# Patient Record
Sex: Female | Born: 1955 | Race: White | Hispanic: No | Marital: Married | Smoking: Former smoker
Health system: Southern US, Community
[De-identification: ages and names within clinical notes are randomized; demographics above are authoritative.]

## PROBLEM LIST (undated history)

## (undated) DIAGNOSIS — S139XXA Sprain of joints and ligaments of unspecified parts of neck, initial encounter: Secondary | ICD-10-CM

## (undated) DIAGNOSIS — IMO0001 Reserved for inherently not codable concepts without codable children: Secondary | ICD-10-CM

## (undated) DIAGNOSIS — K219 Gastro-esophageal reflux disease without esophagitis: Secondary | ICD-10-CM

## (undated) HISTORY — PX: CERVICAL FUSION: SHX112

## (undated) HISTORY — DX: Sprain of joints and ligaments of unspecified parts of neck, initial encounter: S13.9XXA

## (undated) HISTORY — PX: ESOPHAGEAL DILATION: SHX303

## (undated) HISTORY — PX: ABDOMINAL HYSTERECTOMY: SHX81

---

## 2004-05-01 ENCOUNTER — Other Ambulatory Visit: Payer: Self-pay

## 2004-10-30 HISTORY — PX: BREAST CYST ASPIRATION: SHX578

## 2004-10-30 HISTORY — PX: BREAST BIOPSY: SHX20

## 2005-01-24 ENCOUNTER — Ambulatory Visit: Payer: Self-pay

## 2005-02-13 ENCOUNTER — Ambulatory Visit: Payer: Self-pay

## 2005-06-20 ENCOUNTER — Emergency Department: Payer: Self-pay | Admitting: Emergency Medicine

## 2005-06-20 ENCOUNTER — Other Ambulatory Visit: Payer: Self-pay

## 2005-08-30 ENCOUNTER — Ambulatory Visit: Payer: Self-pay

## 2006-02-28 ENCOUNTER — Ambulatory Visit: Payer: Self-pay

## 2006-04-06 ENCOUNTER — Emergency Department: Payer: Self-pay | Admitting: General Practice

## 2007-03-20 ENCOUNTER — Ambulatory Visit: Payer: Self-pay

## 2007-05-09 ENCOUNTER — Ambulatory Visit: Payer: Self-pay

## 2007-07-24 ENCOUNTER — Ambulatory Visit: Payer: Self-pay

## 2008-05-26 ENCOUNTER — Emergency Department: Payer: Self-pay | Admitting: Internal Medicine

## 2008-06-16 ENCOUNTER — Ambulatory Visit: Payer: Self-pay

## 2008-09-12 ENCOUNTER — Emergency Department: Payer: Self-pay | Admitting: Unknown Physician Specialty

## 2008-10-30 ENCOUNTER — Emergency Department: Payer: Self-pay | Admitting: Emergency Medicine

## 2009-03-05 ENCOUNTER — Ambulatory Visit: Payer: Self-pay | Admitting: Internal Medicine

## 2009-03-08 ENCOUNTER — Ambulatory Visit: Payer: Self-pay | Admitting: Internal Medicine

## 2009-03-12 ENCOUNTER — Ambulatory Visit: Payer: Self-pay | Admitting: Family Medicine

## 2009-05-27 ENCOUNTER — Ambulatory Visit: Payer: Self-pay | Admitting: Pain Medicine

## 2009-06-08 ENCOUNTER — Emergency Department: Payer: Self-pay | Admitting: Emergency Medicine

## 2009-06-14 ENCOUNTER — Ambulatory Visit: Payer: Self-pay | Admitting: Pain Medicine

## 2009-06-16 ENCOUNTER — Ambulatory Visit: Payer: Self-pay

## 2009-06-23 ENCOUNTER — Ambulatory Visit: Payer: Self-pay | Admitting: Pain Medicine

## 2009-06-30 ENCOUNTER — Ambulatory Visit: Payer: Self-pay | Admitting: Surgery

## 2009-07-14 HISTORY — PX: BREAST BIOPSY: SHX20

## 2009-07-29 ENCOUNTER — Emergency Department (HOSPITAL_COMMUNITY): Admission: EM | Admit: 2009-07-29 | Discharge: 2009-07-29 | Payer: Self-pay | Admitting: Emergency Medicine

## 2010-04-21 ENCOUNTER — Emergency Department: Payer: Self-pay | Admitting: Emergency Medicine

## 2010-06-21 ENCOUNTER — Ambulatory Visit: Payer: Self-pay

## 2011-02-02 ENCOUNTER — Emergency Department: Payer: Self-pay | Admitting: Emergency Medicine

## 2011-02-27 ENCOUNTER — Emergency Department: Payer: Self-pay | Admitting: Emergency Medicine

## 2011-03-28 ENCOUNTER — Inpatient Hospital Stay: Payer: Self-pay | Admitting: Surgery

## 2011-08-15 ENCOUNTER — Ambulatory Visit: Payer: Self-pay

## 2012-06-04 ENCOUNTER — Ambulatory Visit: Payer: Self-pay

## 2013-01-01 ENCOUNTER — Ambulatory Visit: Payer: Self-pay

## 2013-01-15 ENCOUNTER — Ambulatory Visit: Payer: Self-pay

## 2013-02-03 ENCOUNTER — Ambulatory Visit: Payer: Self-pay | Admitting: Surgery

## 2013-02-03 HISTORY — PX: BREAST BIOPSY: SHX20

## 2013-02-04 LAB — PATHOLOGY REPORT

## 2013-09-19 ENCOUNTER — Emergency Department: Payer: Self-pay | Admitting: Emergency Medicine

## 2013-09-19 LAB — CBC WITH DIFFERENTIAL/PLATELET
Basophil #: 0.1 10*3/uL (ref 0.0–0.1)
Eosinophil %: 1 %
HGB: 13.5 g/dL (ref 12.0–16.0)
Lymphocyte %: 34.3 %
MCHC: 34.3 g/dL (ref 32.0–36.0)
MCV: 93 fL (ref 80–100)
Monocyte #: 0.4 x10 3/mm (ref 0.2–0.9)
Neutrophil #: 4.8 10*3/uL (ref 1.4–6.5)
Neutrophil %: 59.1 %
Platelet: 226 10*3/uL (ref 150–440)
WBC: 8.1 10*3/uL (ref 3.6–11.0)

## 2013-09-19 LAB — BASIC METABOLIC PANEL
Anion Gap: 8 (ref 7–16)
BUN: 19 mg/dL — ABNORMAL HIGH (ref 7–18)
Calcium, Total: 9 mg/dL (ref 8.5–10.1)
Chloride: 108 mmol/L — ABNORMAL HIGH (ref 98–107)
Co2: 25 mmol/L (ref 21–32)
EGFR (Non-African Amer.): >60
Glucose: 126 mg/dL — ABNORMAL HIGH (ref 65–99)
Osmolality: 285 (ref 275–301)
Potassium: 3.9 mmol/L (ref 3.5–5.1)

## 2013-09-19 LAB — DRUG SCREEN, URINE
Barbiturates, Ur Screen: NEGATIVE (ref ?–200)
Phencyclidine (PCP) Ur S: NEGATIVE (ref ?–25)
Tricyclic, Ur Screen: NEGATIVE (ref ?–1000)

## 2013-09-19 LAB — TROPONIN I: Troponin-I: <0.02 ng/mL

## 2013-09-24 ENCOUNTER — Emergency Department (HOSPITAL_BASED_OUTPATIENT_CLINIC_OR_DEPARTMENT_OTHER): Payer: Worker's Compensation

## 2013-09-24 ENCOUNTER — Emergency Department (HOSPITAL_BASED_OUTPATIENT_CLINIC_OR_DEPARTMENT_OTHER)
Admission: EM | Admit: 2013-09-24 | Discharge: 2013-09-24 | Disposition: A | Discharge status: Home / Self Care | Payer: Worker's Compensation | Attending: Emergency Medicine | Admitting: Emergency Medicine

## 2013-09-24 ENCOUNTER — Encounter (HOSPITAL_BASED_OUTPATIENT_CLINIC_OR_DEPARTMENT_OTHER): Payer: Self-pay | Admitting: Emergency Medicine

## 2013-09-24 DIAGNOSIS — Z9181 History of falling: Secondary | ICD-10-CM

## 2013-09-24 DIAGNOSIS — Z8719 Personal history of other diseases of the digestive system: Secondary | ICD-10-CM | POA: Insufficient documentation

## 2013-09-24 DIAGNOSIS — M25519 Pain in unspecified shoulder: Secondary | ICD-10-CM | POA: Insufficient documentation

## 2013-09-24 DIAGNOSIS — G8911 Acute pain due to trauma: Secondary | ICD-10-CM | POA: Insufficient documentation

## 2013-09-24 DIAGNOSIS — R209 Unspecified disturbances of skin sensation: Secondary | ICD-10-CM | POA: Insufficient documentation

## 2013-09-24 DIAGNOSIS — M25539 Pain in unspecified wrist: Secondary | ICD-10-CM | POA: Insufficient documentation

## 2013-09-24 DIAGNOSIS — F17200 Nicotine dependence, unspecified, uncomplicated: Secondary | ICD-10-CM | POA: Insufficient documentation

## 2013-09-24 DIAGNOSIS — M791 Myalgia, unspecified site: Secondary | ICD-10-CM

## 2013-09-24 HISTORY — DX: Reserved for inherently not codable concepts without codable children: IMO0001

## 2013-09-24 HISTORY — DX: Gastro-esophageal reflux disease without esophagitis: K21.9

## 2013-09-24 MED ORDER — CYCLOBENZAPRINE HCL 10 MG PO TABS: 10.0000 mg | ORAL_TABLET | Freq: Two times a day (BID) | ORAL | Status: DC | PRN

## 2013-09-24 MED ORDER — HYDROCODONE-ACETAMINOPHEN 5-325 MG PO TABS
1.0000 | ORAL_TABLET | Freq: Once | ORAL | Status: AC
  Administered 2013-09-24: 1 via ORAL
  Filled 2013-09-24: qty 1

## 2013-09-24 MED ORDER — HYDROCODONE-ACETAMINOPHEN 5-325 MG PO TABS: 1.0000 | ORAL_TABLET | ORAL | Status: DC | PRN

## 2013-09-24 NOTE — ED Provider Notes (Signed)
CSN: [id number]     Arrival date & time 09/24/13  1221 History   First MD Initiated Contact with Patient 09/24/13 1231     Chief Complaint  Patient presents with  . Elbow Injury   (Consider location/radiation/quality/duration/timing/severity/associated sxs/prior Treatment) Patient is a 57 y.o. female presenting with fall. The history is provided by the patient. No language interpreter was used.  Fall The current episode started in the past 7 days. Associated symptoms include numbness. Pertinent negatives include no abdominal pain, chest pain, fever, headaches, nausea or vomiting. Associated symptoms comments: She fell 5 days ago after slipping, hitting her head on the floor with LOC at the time, remaining amnestic to the event. She was seen at the time of the fall at [facility] and reports a negative evaluation. She was sent home on ibuprofen but reports her soreness continues without improvement, specifically, pain in the left elbow with numbness of digits 2-5, pain in the right shoulder and left posterior foot. She has remained ambulatory without difficulty for the past several days. No persistent or significant headache, no nausea or vomiting..    Past Medical History  Diagnosis Date  . Reflux    Past Surgical History  Procedure Laterality Date  . Esophageal dilation    . Cesarean section    . Abdominal hysterectomy     No family history on file. History  Substance Use Topics  . Smoking status: Current Every Day Smoker    Types: Cigarettes  . Smokeless tobacco: Never Used  . Alcohol Use: No   OB History   Grav Para Term Preterm Abortions TAB SAB Ect Mult Living                 Review of Systems  Constitutional: Negative for fever.  Eyes: Negative for visual disturbance.  Respiratory: Negative for shortness of breath.   Cardiovascular: Negative for chest pain.  Gastrointestinal: Negative for nausea, vomiting and abdominal pain.  Genitourinary: Negative for  dysuria.  Musculoskeletal:       See HPI.  Skin: Negative for wound.  Neurological: Positive for numbness. Negative for headaches.  Psychiatric/Behavioral: Negative for confusion.    Allergies  Aspirin and Succinylcholine  Home Medications  No current outpatient prescriptions on file. BP 120/84  Pulse 87  Temp(Src) 97.9 F (36.6 C) (Oral)  Resp 16  Ht 5\' 6"  (1.676 m)  Wt 140 lb (63.504 kg)  BMI 22.61 kg/m2  SpO2 100% Physical Exam  Constitutional: She is oriented to person, place, and time. She appears well-developed and well-nourished.  HENT:  Head: Normocephalic.  Eyes: Pupils are equal, round, and reactive to light.  Neck: Normal range of motion. Neck supple.  Cardiovascular: Normal rate and regular rhythm.   Pulmonary/Chest: Effort normal and breath sounds normal.  Abdominal: Soft. Bowel sounds are normal. There is no tenderness. There is no rebound and no guarding.  Musculoskeletal: Normal range of motion.  FROM all joints of extremities. Left elbow mildly swollen without discoloration or bony deformity. Pronation and supination intact without difficulty. Right scapular tenderness at mid-scapula and with FROM of shoulder. No midline or paraspinal tenderness of back and neck. Left foot without abnormalities in appearance, no discoloration or deformity. Ankle joint stable. Minimal tenderness at Achilles insertion without tendon deficit.  Neurological: She is alert and oriented to person, place, and time. She has normal strength and normal reflexes. No sensory deficit. She displays a negative Romberg sign. Coordination normal.  She is ambulatory and has normal coordination.  Fine motor function preserved. Cranial nerves 3-12 grossly intact.   Skin: Skin is warm and dry. No rash noted.  Psychiatric: She has a normal mood and affect.    ED Course  Procedures (including critical care time) Labs Review Labs Reviewed - No data to display Imaging Review Dg Scapula  Right  09/24/2013   CLINICAL DATA:  History of injury from a fall with pain in the scapular region.  EXAM: RIGHT SCAPULA - 2+ VIEWS  COMPARISON:  None.  FINDINGS: Alignment is normal. Joint spaces are preserved. No fracture or dislocation is evident. No soft tissue lesions are seen. On the age of the examination the hardware from previous lower cervical anterior fusion is evident.  IMPRESSION: No fracture or dislocation is evident.   Electronically Signed   By: Onalee Hua  Call M.D.   On: 09/24/2013 13:34   Dg Elbow Complete Left  09/24/2013   CLINICAL DATA:  History of injury from a fall with painful left elbow.  EXAM: LEFT ELBOW - COMPLETE 3+ VIEW  COMPARISON:  None.  FINDINGS: There is no evidence of joint effusion. Alignment is normal. Joint spaces are preserved. No fracture or dislocation is evident. There is minimal posterior spurring of the olecranon and calcification in the distal triceps tendon.  IMPRESSION: No evidence of joint effusion, fracture, or dislocation. Minimal posterior spurring of the olecranon and calcification in the distal triceps tendon.   Electronically Signed   By: Onalee Hua  Call M.D.   On: 09/24/2013 13:32    EKG Interpretation   None       MDM  No diagnosis found. 1. Myalgia 2. Recent history fall 3. Mild concussion, delayed presentation  No neurologic deficts, full musculoskeletal function, some joint tenderness of elbow (left) and posterior shoulder (right) - x-rays negative. Records reviewed from [facility] visit of 09/19/13 - CT Head negative, chest x-ray negative. She is stable for discharge.     Arnoldo Hooker, PA-C 09/24/13 1351

## 2013-09-24 NOTE — ED Notes (Signed)
Patient states she fell at work five days ago.  States she was seen at [facility].  Now continues to have pain in her left elbow and right shoulder.

## 2013-09-30 NOTE — ED Provider Notes (Signed)
Medical screening examination/treatment/procedure(s) were performed by non-physician practitioner and as supervising physician I was immediately available for consultation/collaboration.  EKG Interpretation   None         Roney Marion, MD 09/30/13 1530

## 2013-11-08 ENCOUNTER — Ambulatory Visit (INDEPENDENT_AMBULATORY_CARE_PROVIDER_SITE_OTHER): Payer: 59 | Admitting: Family Medicine

## 2013-11-08 VITALS — BP 100/62 | HR 73 | Temp 98.4°F | Resp 18 | Ht 65.0 in | Wt 132.0 lb

## 2013-11-08 DIAGNOSIS — G894 Chronic pain syndrome: Secondary | ICD-10-CM

## 2013-11-08 DIAGNOSIS — M898X1 Other specified disorders of bone, shoulder: Secondary | ICD-10-CM

## 2013-11-08 DIAGNOSIS — G8929 Other chronic pain: Secondary | ICD-10-CM

## 2013-11-08 MED ORDER — HYDROCODONE-ACETAMINOPHEN 5-325 MG PO TABS: 1.0000 | ORAL_TABLET | ORAL | Status: DC | PRN

## 2013-11-08 MED ORDER — GABAPENTIN 300 MG PO CAPS: 300.0000 mg | ORAL_CAPSULE | Freq: Every day | ORAL | Status: DC
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
## 2013-11-11 ENCOUNTER — Telehealth: Payer: Self-pay

## 2013-11-11 NOTE — Telephone Encounter (Signed)
Pt would like a refill on hydrocodone but instead of a quantity of 12 she would like to have more because she cant afford to keep getting refill. Pt also states that she needs a doctors for Saturday up until visit with neurologist. Best# [phone]

## 2013-11-12 NOTE — Telephone Encounter (Signed)
Patient calling to see when her prescription will be ready for pick up  [phone]

## 2013-11-12 NOTE — Telephone Encounter (Signed)
Pt CB to check status and I advised Dr L will be in tomorrow to review. Pt stated that the neurologist did call and set up an appt for she believes, 11/24/13. Pt again stated that she does not have a lot of money right now for co-pays and reqs quantity to cover her until appt if possible.

## 2013-11-12 NOTE — Telephone Encounter (Signed)
Forwarded to Dr. [name] for review of refill

## 2013-11-13 ENCOUNTER — Telehealth: Payer: Self-pay

## 2013-11-13 NOTE — Telephone Encounter (Signed)
Pt called again and she has taken her last pill of meds and is need of hydrocodone.  She states again that she would like more than 12 pills at time because cannot afford to keep running to the pharmacy. Please see other messages below

## 2013-11-13 NOTE — Telephone Encounter (Signed)
Patient is calling us back regarding her prescription refill

## 2013-11-13 NOTE — Telephone Encounter (Signed)
Dr L, I spoke to you about this pt's RF and you advised she needs to RTC for f/up. I wasn't sure if you realized that she was here recently on 11/08/13. Do you still need to see pt for re-eval? Just wanted to check before I advised pt.

## 2013-11-13 NOTE — Telephone Encounter (Signed)
LMOM for pt that I sent message back to Dr L to review because he did not have chart open when I asked him about RF, but had advised that he needed to see pt again first. I wanted him to review when pt was seen last and her appt date for specialist and will call her when hear back from him again.

## 2013-11-15 NOTE — Telephone Encounter (Signed)
This is a problem that the neurologist needs to work on.  I cannot refill chronic narcotic meds.  Neurontin is the strongest medicine that I can order.

## 2013-11-15 NOTE — Telephone Encounter (Signed)
lmom [phone]( [phone]) explaining that dr L. can not refill a chronic narcotic med, neurontin is the strongest med he can fill, this is an issue for her neurologist, if she has any other questions to give us  cb.

## 2013-11-24 ENCOUNTER — Ambulatory Visit (INDEPENDENT_AMBULATORY_CARE_PROVIDER_SITE_OTHER): Payer: 59 | Admitting: Neurology

## 2013-11-24 ENCOUNTER — Encounter: Payer: Self-pay | Admitting: Neurology

## 2013-11-24 ENCOUNTER — Encounter (INDEPENDENT_AMBULATORY_CARE_PROVIDER_SITE_OTHER): Payer: Self-pay

## 2013-11-24 VITALS — BP 109/73 | HR 73 | Ht 66.0 in | Wt 136.0 lb

## 2013-11-24 DIAGNOSIS — G894 Chronic pain syndrome: Secondary | ICD-10-CM

## 2013-11-24 DIAGNOSIS — M898X1 Other specified disorders of bone, shoulder: Secondary | ICD-10-CM

## 2013-11-24 DIAGNOSIS — M899 Disorder of bone, unspecified: Secondary | ICD-10-CM

## 2013-11-24 DIAGNOSIS — M949 Disorder of cartilage, unspecified: Secondary | ICD-10-CM

## 2013-11-24 DIAGNOSIS — G8929 Other chronic pain: Secondary | ICD-10-CM

## 2013-11-24 DIAGNOSIS — S139XXA Sprain of joints and ligaments of unspecified parts of neck, initial encounter: Secondary | ICD-10-CM

## 2013-11-24 HISTORY — DX: Sprain of joints and ligaments of unspecified parts of neck, initial encounter: S13.9XXA

## 2013-11-24 MED ORDER — GABAPENTIN 300 MG PO CAPS: 300.0000 mg | ORAL_CAPSULE | Freq: Three times a day (TID) | ORAL | Status: DC

## 2013-11-24 MED ORDER — HYDROCODONE-ACETAMINOPHEN 5-325 MG PO TABS: 1.0000 | ORAL_TABLET | Freq: Four times a day (QID) | ORAL | Status: DC | PRN

## 2013-11-24 NOTE — Progress Notes (Signed)
Reason for visit: Neck and shoulder discomfort  Melinda [name] is a 58 y.o. female  History of present illness:  Melinda [name] is a 58 year old right-handed white female with a history of cervical spine surgery in the past. The patient fell at work on 09/19/2013, slipping and falling backwards. The patient hit her shoulder and head on the floor, with subsequent loss of consciousness for an undetermined period of time. The patient has not had any significant issues with headaches or confusion, but the patient has developed some neck and shoulder discomfort. The patient indicates that the symptoms are worse on the left, but they are now worsening some on the right as well. The patient will have some discomfort down the left arm radiating to the elbow, with some numbness and pain in the left hand. The patient is now developing some numbness and tingling in the right hand as well. The patient feels weak in both arms, both with the lifting power and with the ability to open things with the hands. The patient denies any issues with the lower extremities with numbness or weakness, and she denies any balance issues or problems controlling the bowels or the bladder. The patient does have some neck stiffness. The patient has pain radiating down between the shoulder blades. The patient is on Flexeril taking 10 mg twice daily, and gabapentin 300 mg at night. The patient has been given a small prescription for hydrocodone. The patient is sent to this office for further evaluation.  Past Medical History  Diagnosis Date  . Reflux   . Sprain of neck 11/24/2013    Past Surgical History  Procedure Laterality Date  . Esophageal dilation    . Cesarean section    . Abdominal hysterectomy    . Cervical fusion      Family History  Problem Relation Age of Onset  . Diabetes Father     Social history:  reports that she has quit smoking. Her smoking use included Cigarettes. She smoked 0.00 packs per day. She has  never used smokeless tobacco. She reports that she does not drink alcohol or use illicit drugs.  Medications:  Current Outpatient Prescriptions on File Prior to Visit  Medication Sig Dispense Refill  . acetaminophen (TYLENOL) 500 MG tablet Take 500 mg by mouth every 6 (six) hours as needed.      . cyclobenzaprine (FLEXERIL) 10 MG tablet Take 1 tablet (10 mg total) by mouth 2 (two) times daily as needed for muscle spasms.  20 tablet  0   No current facility-administered medications on file prior to visit.      Allergies  Allergen Reactions  . Aspirin Anaphylaxis  . Succinylcholine Anaphylaxis    coma  . Tramadol     ROS:  Out of a complete 14 system review of symptoms, the patient complains only of the following symptoms, and all other reviewed systems are negative.  Eye pain Joint pain Numbness Insomnia  Blood pressure 109/73, pulse 73, height 5\' 6"  (1.676 m), weight 136 lb (61.689 kg).  Physical Exam  General: The patient is alert and cooperative at the time of the examination.  Eyes: Pupils are equal, round, and reactive to light. Discs are flat bilaterally. Good venous pulsations are seen in the discs bilaterally.  Neck: The neck is supple, no carotid bruits are noted.  Respiratory: The respiratory examination is clear.  Cardiovascular: The cardiovascular examination reveals a regular rate and rhythm, no obvious murmurs or rubs are noted.  Neuromuscular:  The patient lacks 15-20 of full lateral rotation of the cervical spine bilaterally.  Skin: Extremities are without significant edema.  Neurologic Exam  Mental status: The patient is alert and oriented x 3 at the time of the examination. The patient has apparent normal recent and remote memory, with an apparently normal attention span and concentration ability.  Cranial nerves: Facial symmetry is present. There is good sensation of the face to pinprick and soft touch bilaterally. The strength of the facial  muscles and the muscles to head turning and shoulder shrug are normal bilaterally. Speech is well enunciated, no aphasia or dysarthria is noted. Extraocular movements are full. Visual fields are full. The tongue is midline, and the patient has symmetric elevation of the soft palate. No obvious hearing deficits are noted.  Motor: The motor testing reveals 5 over 5 strength of all 4 extremities. Good symmetric motor tone is noted throughout.  Sensory: Sensory testing is intact to pinprick, soft touch, vibration sensation, and position sense on all 4 extremities, with exception that pinprick sensation and vibration sensation are decreased on the left arm. No evidence of extinction is noted.  Coordination: Cerebellar testing reveals good finger-nose-finger and heel-to-shin bilaterally.  Gait and station: Gait is normal. Tandem gait is normal. Romberg is negative. No drift is seen.  Reflexes: Deep tendon reflexes are symmetric and normal bilaterally. Toes are downgoing bilaterally.   Assessment/Plan:  1. Cervical spine discomfort, arm pain  The patient does have a prior history of neck surgery. The patient has sustained a recent fall with increased symptoms in the neck and shoulders and arms. The patient will be set up for neuromuscular therapy, and the gabapentin will be increased to 300 mg 3 times daily. If her prescription was given for the hydrocodone with 40 tablets. The patient was set up for MRI evaluation of the cervical spine. The patient will followup through this office in 3-4 months.  Marlan Palau. Keith Maaz Spiering MD 11/24/2013 7:23 PM  [facility] [address] [place], [place] [address]  Phone [phone] Fax [phone]

## 2013-12-01 ENCOUNTER — Other Ambulatory Visit: Payer: 59

## 2013-12-03 ENCOUNTER — Ambulatory Visit (INDEPENDENT_AMBULATORY_CARE_PROVIDER_SITE_OTHER): Payer: 59

## 2013-12-03 DIAGNOSIS — M898X1 Other specified disorders of bone, shoulder: Secondary | ICD-10-CM

## 2013-12-03 DIAGNOSIS — M949 Disorder of cartilage, unspecified: Secondary | ICD-10-CM

## 2013-12-03 DIAGNOSIS — G8929 Other chronic pain: Secondary | ICD-10-CM

## 2013-12-03 DIAGNOSIS — M899 Disorder of bone, unspecified: Secondary | ICD-10-CM

## 2013-12-03 DIAGNOSIS — G894 Chronic pain syndrome: Secondary | ICD-10-CM

## 2013-12-04 ENCOUNTER — Telehealth: Payer: Self-pay | Admitting: Neurology

## 2013-12-04 DIAGNOSIS — M79609 Pain in unspecified limb: Secondary | ICD-10-CM

## 2013-12-04 NOTE — Telephone Encounter (Signed)
I called the patient. MRI the cervical spine does not show clear evidence of nerve root impingement. The patient has neck pain, and discomfort down the left arm. I'll get nerve conduction studies of both arms, and EMG of the left arm. The patient has not yet heard about physical therapy.  MRI cervical spine 12/04/2013:  Impression   Abnormal MRI cervical spine (without) demonstrating: 1. At C6-7: broad disc bulging with mild spinal stenosis and moderate  biforaminal foraminal stenosis; no cord signal abnormalities 2. Anterior cervical spine fusion with metal hardware at C5-C6

## 2013-12-05 ENCOUNTER — Telehealth: Payer: Self-pay | Admitting: Neurology

## 2013-12-05 NOTE — Telephone Encounter (Signed)
Left message for patient to call and schedule NCV/EMG test.

## 2013-12-10 ENCOUNTER — Encounter (INDEPENDENT_AMBULATORY_CARE_PROVIDER_SITE_OTHER): Payer: Self-pay

## 2013-12-10 ENCOUNTER — Ambulatory Visit (INDEPENDENT_AMBULATORY_CARE_PROVIDER_SITE_OTHER): Payer: 59 | Admitting: Neurology

## 2013-12-10 DIAGNOSIS — M79609 Pain in unspecified limb: Secondary | ICD-10-CM

## 2013-12-10 DIAGNOSIS — S139XXA Sprain of joints and ligaments of unspecified parts of neck, initial encounter: Secondary | ICD-10-CM

## 2013-12-10 DIAGNOSIS — Z0289 Encounter for other administrative examinations: Secondary | ICD-10-CM

## 2013-12-10 MED ORDER — OXYCODONE HCL 5 MG PO CAPS: 5.0000 mg | ORAL_CAPSULE | Freq: Four times a day (QID) | ORAL | Status: DC | PRN

## 2013-12-10 NOTE — Procedures (Signed)
     HISTORY:  Melinda [name] is a 58 year old patient with a history of a fall that occurred at work on 09/19/2013. The patient has had some right shoulder discomfort, and some discomfort going down the left arm to the elbow and some numbness in the left hand. The patient is being evaluated for possible neuropathy or a cervical radiculopathy. The patient has a history of prior cervical spine surgery.   NERVE CONDUCTION STUDIES:  Nerve conduction studies were performed on both upper extremities. The distal motor latencies and motor amplitudes for the median and ulnar nerves were within normal limits. The F wave latencies and nerve conduction velocities for these nerves were also normal. The sensory latencies for the median and ulnar nerves were normal.   EMG STUDIES:  EMG study was performed on the left upper extremity:  The first dorsal interosseous muscle reveals 2 to 4 K units with full recruitment. No fibrillations or positive waves were noted. The abductor pollicis brevis muscle reveals 2 to 4 K units with full recruitment. No fibrillations or positive waves were noted. The extensor indicis proprius muscle reveals 1 to 3 K units with full recruitment. No fibrillations or positive waves were noted. The pronator teres muscle reveals 2 to 3 K units with full recruitment. No fibrillations or positive waves were noted. The biceps muscle reveals 1 to 2 K units with full recruitment. No fibrillations or positive waves were noted. The triceps muscle reveals 2 to 4 K units with full recruitment. No fibrillations or positive waves were noted. The anterior deltoid muscle reveals 2 to 3 K units with full recruitment. No fibrillations or positive waves were noted. The cervical paraspinal muscles were tested at 2 levels. No abnormalities of insertional activity were seen at either level tested. There was fair relaxation.   IMPRESSION:  Nerve conduction studies done on both upper extremities were  unremarkable. No evidence of a neuropathy is seen. EMG evaluation of the left upper extremity is unremarkable without evidence of an overlying cervical radiculopathy.  Marlan Palau. Keith Willis MD 12/10/2013 4:39 PM  [facility] [address] [place] [id number]  Phone [phone] Fax [phone]

## 2013-12-10 NOTE — Progress Notes (Signed)
Melinda [name] is a 58 year old patient with a history of a fall that occurred at work on 09/19/2013. The patient has sustained some right shoulder pain, discomfort down the left arm. The patient comes in today for EMG and nerve conduction study. MRI of the cervical spine shows evidence of a C6-7 disc bulge without definite nerve root compression.  Nerve conduction studies on both arms were normal. EMG of the left arm was normal.  The patient is on gabapentin taking 300 mg 3 times daily. This dose may need to be increased. Hydrocodone we'll last only one hour for the pain. The patient will be switched to oxycodone taking 5 mg tablets if needed.  The patient will be set up for neuromuscular therapy. The patient will followup in 3 months.   MRI cervical spine:  IMPRESSION:  Abnormal MRI cervical spine (without) demonstrating:  1. At C6-7: broad disc bulging with mild spinal stenosis and moderate biforaminal foraminal stenosis; no cord signal abnormalities  2. Anterior cervical spine fusion with metal hardware at C5-C6

## 2013-12-17 ENCOUNTER — Ambulatory Visit: Payer: 59 | Attending: Neurology | Admitting: Rehabilitative and Restorative Service Providers"

## 2013-12-17 DIAGNOSIS — M542 Cervicalgia: Secondary | ICD-10-CM | POA: Insufficient documentation

## 2013-12-17 DIAGNOSIS — IMO0001 Reserved for inherently not codable concepts without codable children: Secondary | ICD-10-CM | POA: Insufficient documentation

## 2013-12-23 ENCOUNTER — Telehealth: Payer: Self-pay | Admitting: Neurology

## 2013-12-23 ENCOUNTER — Ambulatory Visit: Payer: 59 | Admitting: Rehabilitative and Restorative Service Providers"

## 2013-12-23 NOTE — Telephone Encounter (Signed)
Patient is requesting an early refill on Oxycodone.  Rx was last written on 02/11, noting must last 21 days.  Patient indicates she is taking 3 daily and only has 6 caps left.  She would like the Rx written early because she says she has increased pain.  Please advise.  Thank you.

## 2013-12-23 NOTE — Telephone Encounter (Signed)
Pt called regarding the pain medicine prescription. She states that she has been in a lot of pain and has been taking 3 day. She has an appointment today for physical therapy and will speak with her therapist to see what she thinks as well  Please call to discuss.  Thank you.

## 2013-12-23 NOTE — Telephone Encounter (Signed)
Patient states she is coming over for rehab today and is wondering if she can swing by our office and pick up a refill of her Percocet. Patient [place] and was trying to see if she can get it while she's over here. Patient states she has about 6 left.

## 2013-12-23 NOTE — Telephone Encounter (Signed)
The patient came in today for an early refill on her oxycodone. The medication must last 21 days. She was informed of this.

## 2013-12-23 NOTE — Telephone Encounter (Signed)
Rx was last prescribe 02/11.  The Rx specifically states Must Last 21 days.  Refill is not due until 03/03.  I called the patient back.  Got no answer.  Left message.

## 2013-12-26 ENCOUNTER — Ambulatory Visit: Payer: 59 | Admitting: Rehabilitative and Restorative Service Providers"

## 2013-12-29 ENCOUNTER — Ambulatory Visit: Payer: 59 | Admitting: Rehabilitative and Restorative Service Providers"

## 2013-12-30 ENCOUNTER — Telehealth: Payer: Self-pay | Admitting: Neurology

## 2013-12-30 NOTE — Telephone Encounter (Signed)
Patient states she has a therapy appointment tomorrow next door and is wondering if she can pick up her Percocet refill while she's over here since [place] side [place].

## 2013-12-31 ENCOUNTER — Ambulatory Visit: Payer: 59 | Attending: Neurology | Admitting: Rehabilitative and Restorative Service Providers"

## 2013-12-31 ENCOUNTER — Telehealth: Payer: Self-pay | Admitting: Neurology

## 2013-12-31 ENCOUNTER — Other Ambulatory Visit: Payer: Self-pay

## 2013-12-31 DIAGNOSIS — IMO0001 Reserved for inherently not codable concepts without codable children: Secondary | ICD-10-CM | POA: Insufficient documentation

## 2013-12-31 DIAGNOSIS — M542 Cervicalgia: Secondary | ICD-10-CM | POA: Insufficient documentation

## 2013-12-31 MED ORDER — OXYCODONE HCL 5 MG PO CAPS: 5.0000 mg | ORAL_CAPSULE | Freq: Four times a day (QID) | ORAL | Status: DC | PRN

## 2013-12-31 NOTE — Telephone Encounter (Signed)
Kim with [facility] called and stated that the PT is there with her Oxycodone Rx now and its for capsules, however it is cheaper for the patient to get the tablets.  They need to know if they can give the tablets.  Please call back as soon as possible as the patient is there waiting.  Thank you

## 2013-12-31 NOTE — Telephone Encounter (Signed)
Called patient to inform her that her Rx was ready to be picked up and if she has any other problems, questions or concerns to call the office. Patient verbalized understanding.

## 2013-12-31 NOTE — Telephone Encounter (Signed)
I called back.  Spoke with Melinda [name].  She said they only have the tablets in stock, not the capsules.  Indicates they are the same strength, and capsules are not available at this time.  Says they gave the patient tablets last month as well because caps were not avail then either.

## 2014-01-05 ENCOUNTER — Ambulatory Visit: Payer: Self-pay | Admitting: Rehabilitative and Restorative Service Providers"

## 2014-01-06 ENCOUNTER — Ambulatory Visit: Payer: 59 | Admitting: Rehabilitative and Restorative Service Providers"

## 2014-01-07 ENCOUNTER — Ambulatory Visit: Payer: Self-pay | Admitting: Rehabilitative and Restorative Service Providers"

## 2014-01-07 ENCOUNTER — Ambulatory Visit: Payer: 59 | Admitting: Rehabilitative and Restorative Service Providers"

## 2014-01-12 ENCOUNTER — Ambulatory Visit: Payer: 59 | Admitting: Rehabilitative and Restorative Service Providers"

## 2014-01-14 ENCOUNTER — Ambulatory Visit: Payer: Self-pay | Admitting: Rehabilitative and Restorative Service Providers"

## 2014-01-15 ENCOUNTER — Ambulatory Visit: Payer: 59

## 2014-01-19 ENCOUNTER — Ambulatory Visit: Payer: 59 | Admitting: Rehabilitative and Restorative Service Providers"

## 2014-01-20 ENCOUNTER — Telehealth: Payer: Self-pay | Admitting: Nurse Practitioner

## 2014-01-20 ENCOUNTER — Telehealth: Payer: Self-pay | Admitting: Neurology

## 2014-01-20 MED ORDER — CYCLOBENZAPRINE HCL 10 MG PO TABS: 10.0000 mg | ORAL_TABLET | Freq: Two times a day (BID) | ORAL | Status: DC | PRN

## 2014-01-20 MED ORDER — OXYCODONE HCL 5 MG PO TABS: 5.0000 mg | ORAL_TABLET | Freq: Four times a day (QID) | ORAL | Status: DC | PRN

## 2014-01-20 NOTE — Telephone Encounter (Signed)
Pt called to schedule an apt got pt schedule w/CM 01/21/14 wants to speak with her concerning medications and her PT wanted her to get in also, PT will be sending information over per concerns. I didn't see anything for Dr. [name] till later in the year so I put her in for NP/CM.  Pt also request a refill on her cyclobenzaprine (FLEXERIL) 10 MG tablet & oxycodone (OXY-IR) 5 MG capsule. Please call pt back when this is ready for pick up and when the other one has been called in at the pharmacy. Thanks

## 2014-01-20 NOTE — Telephone Encounter (Signed)
Shanda BumpsJessica, I was going to close this encounter and put it under Dr. [name]. I spoke with Annabelle Harmanana and Judeth CornfieldStephanie and they advised me this needs to be under Dr. [name], pt has not seen CM and that it will be sent to triage to reschedule for Dr. [name]. She will still need the medication but it will need to be under Dr. [name]. Hope I explained this ok and thanks for your help. If you see this again on the next encounter, I will be sending the message below to triage but under Dr. [name] name instead of CM. Thanks

## 2014-01-20 NOTE — Telephone Encounter (Signed)
Patient is requesting a Rx for Flexeril.  It does not appear we have prescribed this before.  Would you like to fill?  As well, she wants a refill on Oxycodone 5mg  (tomorrow is the 21st day).  The pharmacy is unable to get the capsules at this time, could the Rx be written for tablets instead? Patient has an appt on 03/25.  Please advise.  Thank you.

## 2014-01-20 NOTE — Telephone Encounter (Signed)
Patient is requesting a Rx for Flexeril. It does not appear we have prescribed this before. Would you like to fill? As well, she wants a refill on Oxycodone 5mg  (tomorrow is the 21st day). The pharmacy is unable to get the capsules at this time, could the Rx be written for tablets instead? Patient will be scheduling an appt . Please advise. Thank you.

## 2014-01-20 NOTE — Telephone Encounter (Signed)
Pt called to schedule an apt got pt schedule w/CM 01/21/14 wants to speak with her concerning medications and her PT wanted her to get in also, PT will be sending information over per concerns. I didn't see anything for Dr. [name] till later in the year so I put her in for NP/CM.  Pt also request a refill on her cyclobenzaprine (FLEXERIL) 10 MG tablet & oxycodone (OXY-IR) 5 MG capsule. Please call pt back when this is ready for pick up and when the other one has been called in at the pharmacy. Thanks   Triage I copied and paste this message from my previous message due to this should have been scheduled under Dr. [name]. I spoke with Annabelle Harmanana and Judeth CornfieldStephanie and they advised me that she needs to be schedule with Dr. [name] because she has not seen CM and also because of the medication that pt is requesting refills on. I called pt back advised that triage would be calling her to r/s w/Dr. [name] for next available and that PT will be sending a note to Dr. [name] concerning that the therapy for neck/shoulder/arm does not seem to be helping pt.   I also sent Shanda BumpsJessica the refill request showing above.  Sorry for the confusion.  Thanks

## 2014-01-20 NOTE — Telephone Encounter (Signed)
Scheduled/confirmed w/ patient, [facility]

## 2014-01-20 NOTE — Telephone Encounter (Signed)
Prescription for the oxycodone tablets was written, I will write a prescription for Flexeril.

## 2014-01-21 ENCOUNTER — Other Ambulatory Visit: Payer: Self-pay | Admitting: Neurology

## 2014-01-21 ENCOUNTER — Ambulatory Visit: Payer: 59 | Admitting: Nurse Practitioner

## 2014-01-21 ENCOUNTER — Ambulatory Visit: Payer: Self-pay | Admitting: Rehabilitative and Restorative Service Providers"

## 2014-01-21 NOTE — Telephone Encounter (Signed)
Called pt and left message informing her that her Rx was ready to be picked up at the front desk and if she has any other problems, questions or concerns to call the office.  °

## 2014-01-28 ENCOUNTER — Ambulatory Visit (INDEPENDENT_AMBULATORY_CARE_PROVIDER_SITE_OTHER): Payer: 59 | Admitting: Nurse Practitioner

## 2014-01-28 ENCOUNTER — Encounter (INDEPENDENT_AMBULATORY_CARE_PROVIDER_SITE_OTHER): Payer: Self-pay

## 2014-01-28 ENCOUNTER — Encounter: Payer: Self-pay | Admitting: Nurse Practitioner

## 2014-01-28 VITALS — BP 134/83 | HR 76 | Ht 64.0 in | Wt 134.0 lb

## 2014-01-28 DIAGNOSIS — M899 Disorder of bone, unspecified: Secondary | ICD-10-CM

## 2014-01-28 DIAGNOSIS — G8929 Other chronic pain: Secondary | ICD-10-CM

## 2014-01-28 DIAGNOSIS — M79602 Pain in left arm: Secondary | ICD-10-CM

## 2014-01-28 DIAGNOSIS — M898X1 Other specified disorders of bone, shoulder: Secondary | ICD-10-CM

## 2014-01-28 DIAGNOSIS — M542 Cervicalgia: Secondary | ICD-10-CM

## 2014-01-28 DIAGNOSIS — M79609 Pain in unspecified limb: Secondary | ICD-10-CM

## 2014-01-28 DIAGNOSIS — R200 Anesthesia of skin: Secondary | ICD-10-CM

## 2014-01-28 DIAGNOSIS — R209 Unspecified disturbances of skin sensation: Secondary | ICD-10-CM

## 2014-01-28 DIAGNOSIS — M949 Disorder of cartilage, unspecified: Secondary | ICD-10-CM

## 2014-01-28 NOTE — Progress Notes (Signed)
I have read the note, and I agree with the clinical assessment and plan.  Melinda [name] KEITH   

## 2014-01-28 NOTE — Progress Notes (Signed)
PATIENT: Melinda [name] DOB: [birth year]  REASON FOR VISIT: follow up for neck/shoulder pain, left arm pain HISTORY FROM: patient  HISTORY OF PRESENT ILLNESS: Melinda [name] is a 58 year old right-handed white female with a history of cervical spine surgery in the past. The patient fell at work on 09/19/2013, slipping and falling backwards. The patient hit her shoulder and head on the floor, with subsequent loss of consciousness for an undetermined period of time. The patient has not had any significant issues with headaches or confusion, but the patient has developed some neck and shoulder discomfort. The patient indicates that the symptoms are worse on the left, but they are now worsening some on the right as well. The patient will have some discomfort down the left arm radiating to the elbow, with some numbness and pain in the left hand. The patient is now developing some numbness and tingling in the right hand as well. The patient feels weak in both arms, both with the lifting power and with the ability to open things with the hands. The patient denies any issues with the lower extremities with numbness or weakness, and she denies any balance issues or problems controlling the bowels or the bladder. The patient does have some neck stiffness. The patient has pain radiating down between the shoulder blades. The patient is on Flexeril taking 10 mg twice daily, and gabapentin 300 mg at night. The patient has been given a small prescription for hydrocodone. The patient is sent to this office for further evaluation.   UPDATE 01/28/14 (LL): Patient returns for follow up after MRI.  MRI cervical spine shows At C6-7: broad disc bulging with mild spinal stenosis and moderate biforaminal foraminal stenosis; no cord signal abnormalities.  Also anterior cervical spine fusion with metal hardware at C5-C6.  Nerve conduction studies done on both upper extremities were unremarkable. No evidence of a neuropathy is  seen. EMG evaluation of the left upper extremity is unremarkable without evidence of an overlying cervical radiculopathy. She is still having significant pain in the neck, and in the back radiating to the left scapula.  The numbness in the left hand is improved.  She is using Flexeril and Oxycodone for pain.  The therapist advised re-evaluation before continuing with therapy, I do not have access to that report.   ROS:  Out of a complete 14 system review of symptoms, the patient complains only of the following symptoms, and all other reviewed systems are negative.  Aching muscles Neck pain Joint pain  Insomnia   ALLERGIES: Allergies  Allergen Reactions  . Aspirin Anaphylaxis  . Succinylcholine Anaphylaxis    coma  . Tramadol     HOME MEDICATIONS: Outpatient Prescriptions Prior to Visit  Medication Sig Dispense Refill  . acetaminophen (TYLENOL) 500 MG tablet Take 500 mg by mouth every 6 (six) hours as needed.      . cyclobenzaprine (FLEXERIL) 10 MG tablet Take 1 tablet (10 mg total) by mouth 2 (two) times daily as needed for muscle spasms.  60 tablet  3  . gabapentin (NEURONTIN) 300 MG capsule Take 1 capsule (300 mg total) by mouth 3 (three) times daily.  90 capsule  3  . oxyCODONE (OXY IR/ROXICODONE) 5 MG immediate release tablet Take 1 tablet (5 mg total) by mouth every 6 (six) hours as needed for severe pain.  40 tablet  0  . prednisoLONE acetate (PRED FORTE) 1 % ophthalmic suspension Place 1 % into both eyes daily.  No facility-administered medications prior to visit.     PHYSICAL EXAM  Filed Vitals:   01/28/14 1433  BP: 134/83  Pulse: 76  Height: 5\' 4"  (1.626 m)  Weight: 134 lb (60.782 kg)   Body mass index is 22.99 kg/(m^2).  Generalized: Well developed, in no acute distress  Head: normocephalic and atraumatic. Oropharynx benign  Neck: Supple, no carotid bruits  Cardiac: Regular rate rhythm, no murmur  Neuromuscular: The patient lacks 15-20 of full lateral  rotation of the cervical spine bilaterally.  Skin: Extremities are without significant edema.   Neurologic Exam  Mental status: The patient is alert and oriented x 3 at the time of the examination. The patient has apparent normal recent and remote memory, with an apparently normal attention span and concentration ability.  Cranial nerves: Facial symmetry is present. There is good sensation of the face to pinprick and soft touch bilaterally. The strength of the facial muscles and the muscles to head turning and shoulder shrug are normal bilaterally. Speech is well enunciated, no aphasia or dysarthria is noted. Extraocular movements are full. Visual fields are full. The tongue is midline, and the patient has symmetric elevation of the soft palate. No obvious hearing deficits are noted.  Motor: The motor testing reveals 5 over 5 strength of all 4 extremities. Good symmetric motor tone is noted throughout.  Sensory: Sensory testing is intact to pinprick, soft touch, vibration sensation, and position sense on all 4 extremities, with exception that pinprick sensation and vibration sensation are decreased on the left arm. No evidence of extinction is noted.  Coordination: Cerebellar testing reveals good finger-nose-finger and heel-to-shin bilaterally.  Gait and station: Gait is normal. Tandem gait is normal. Romberg is negative. No drift is seen.  Reflexes: Deep tendon reflexes are symmetric and normal bilaterally.   ASSESSMENT AND PLAN 58 y.o. year old female  has a past medical history of Reflux and Sprain of neck (11/24/2013) here for follow up of neck pain and scapular pain after a fall with LOC in November 2014.  She did not benefit from PT and it was stopped early due to pain.  I will send her to [facility] for Epidural Steroid Injection for neck pain, and check MRI Thoracic Spine to evaluate pain radiating to left scapula.   Orders Placed This Encounter  Procedures  . MR Thoracic Spine Wo  Contrast  . Ambulatory referral to Interventional Radiology   Follow up in 6 months with Dr. Anne Hahn.  Ronal Fear, MSN, NP-C 01/28/2014, 4:23 PM [facility] [address], [address] [place], [place] [address] [phone]  Note: This document was prepared with digital dictation and possible smart phrase technology. Any transcriptional errors that result from this process are unintentional.

## 2014-01-28 NOTE — Patient Instructions (Signed)
Continue Flexeril as needed for muscle tightness.  We will check a thoracic spine MRI, someone will call you to schedule.  [facility] will [facility] on [address] to have a cortisone injection.  Follow up with Dr. [name] in 6 months, sooner as needed.   Epidural Steroid Injection An epidural steroid injection is given to relieve pain in your neck, back, or legs that is caused by the irritation or swelling of a nerve root. This procedure involves injecting a steroid and numbing medicine (anesthetic) into the epidural space. The epidural space is the space between the outer covering of your spinal cord and the bones that form your backbone (vertebra).  LET [facility]OUR [facility]ALTH CARE PROVIDER KNOW ABOUT:   Any allergies you have.  All medicines you are taking, including vitamins, herbs, eye drops, creams, and over-the-counter medicines such as aspirin.  Previous problems you or members of your family have had with the use of anesthetics.  Any blood disorders or blood clotting disorders you have.  Previous surgeries you have had.  Medical conditions you have. RISKS AND COMPLICATIONS Generally, this is a safe procedure. However, as with any procedure, complications can occur. Possible complications of epidural steroid injection include:  Headache.  Bleeding.  Infection.  Allergic reaction to the medicines.  Damage to your nerves. The response to this procedure depends on the underlying cause of the pain and its duration. People who have long-term (chronic) pain are less likely to benefit from epidural steroids than are those people whose pain comes on strong and suddenly. BEFORE THE PROCEDURE   Ask your health care provider about changing or stopping your regular medicines. You may be advised to stop taking blood-thinning medicines a few days before the procedure.  You may be given medicines to reduce anxiety.  Arrange for someone to take you home after the  procedure. PROCEDURE   You will remain awake during the procedure. You may receive medicine to make you relaxed.  You will be asked to lie on your stomach.  The injection site will be cleaned.  The injection site will be numbed with a medicine (local anesthetic).  A needle will be injected through your skin into the epidural space.  Your health care provider will use an X-ray machine to ensure that the steroid is delivered closest to the affected nerve. You may have minimal discomfort at this time.  Once the needle is in the right position, the local anesthetic and the steroid will be injected into the epidural space.  The needle will then be removed and a bandage will be applied to the injection site. AFTER THE PROCEDURE   You may be monitored for a short time before you go home.  You may feel weakness or numbness in your arm or leg, which disappears within hours.  You may be allowed to eat, drink, and take your regular medicine.  You may have soreness at the site of the injection. Document Released: 01/23/2008 Document Revised: 06/18/2013 Document Reviewed: 04/04/2013 [facility] Patient Information 2014 [employer] [place].

## 2014-01-29 ENCOUNTER — Ambulatory Visit: Payer: Self-pay

## 2014-02-02 ENCOUNTER — Telehealth: Payer: Self-pay | Admitting: Neurology

## 2014-02-02 ENCOUNTER — Ambulatory Visit: Payer: Self-pay

## 2014-02-02 NOTE — Telephone Encounter (Signed)
Patient is requesting Rx for a sedative to take prior to MRI.  Please advise.  Thank you.

## 2014-02-02 NOTE — Telephone Encounter (Signed)
I called patient. The patient will be having MRI evaluation on April 9. The patient wants to have some Xanax to take before the scan, okay to come by and pick up a packet prior to the scan.

## 2014-02-02 NOTE — Telephone Encounter (Signed)
Pt called would like to have something to take to help pt the day of the MRI. Please call pt to confirm this information. Thanks

## 2014-02-03 ENCOUNTER — Other Ambulatory Visit: Payer: Self-pay | Admitting: Nurse Practitioner

## 2014-02-03 DIAGNOSIS — M79609 Pain in unspecified limb: Secondary | ICD-10-CM

## 2014-02-03 DIAGNOSIS — M542 Cervicalgia: Secondary | ICD-10-CM

## 2014-02-03 DIAGNOSIS — M949 Disorder of cartilage, unspecified: Secondary | ICD-10-CM

## 2014-02-03 DIAGNOSIS — R209 Unspecified disturbances of skin sensation: Secondary | ICD-10-CM

## 2014-02-03 DIAGNOSIS — M899 Disorder of bone, unspecified: Secondary | ICD-10-CM

## 2014-02-03 NOTE — Telephone Encounter (Signed)
Called pt and left message informing her that her medication for her MRI was ready to be picked up at the front desk and if she has any other problems, questions or concerns to call the office.

## 2014-02-04 ENCOUNTER — Ambulatory Visit: Payer: Self-pay | Admitting: Rehabilitative and Restorative Service Providers"

## 2014-02-05 ENCOUNTER — Other Ambulatory Visit (INDEPENDENT_AMBULATORY_CARE_PROVIDER_SITE_OTHER): Payer: Self-pay

## 2014-02-05 DIAGNOSIS — R209 Unspecified disturbances of skin sensation: Secondary | ICD-10-CM

## 2014-02-05 DIAGNOSIS — M542 Cervicalgia: Secondary | ICD-10-CM

## 2014-02-05 DIAGNOSIS — Z0289 Encounter for other administrative examinations: Secondary | ICD-10-CM

## 2014-02-05 DIAGNOSIS — M79609 Pain in unspecified limb: Secondary | ICD-10-CM

## 2014-02-05 DIAGNOSIS — G8929 Other chronic pain: Secondary | ICD-10-CM

## 2014-02-06 ENCOUNTER — Other Ambulatory Visit: Payer: Self-pay | Admitting: Neurology

## 2014-02-06 DIAGNOSIS — M542 Cervicalgia: Secondary | ICD-10-CM

## 2014-02-06 DIAGNOSIS — G8929 Other chronic pain: Secondary | ICD-10-CM

## 2014-02-06 DIAGNOSIS — R200 Anesthesia of skin: Secondary | ICD-10-CM

## 2014-02-06 DIAGNOSIS — M898X1 Other specified disorders of bone, shoulder: Secondary | ICD-10-CM

## 2014-02-06 DIAGNOSIS — M549 Dorsalgia, unspecified: Secondary | ICD-10-CM

## 2014-02-06 DIAGNOSIS — M79602 Pain in left arm: Secondary | ICD-10-CM

## 2014-02-09 ENCOUNTER — Telehealth: Payer: Self-pay | Admitting: Neurology

## 2014-02-09 MED ORDER — OXYCODONE-ACETAMINOPHEN 7.5-325 MG PO TABS: 1.0000 | ORAL_TABLET | Freq: Four times a day (QID) | ORAL | Status: DC | PRN

## 2014-02-09 NOTE — Telephone Encounter (Signed)
Original work-related accident occurred in November 2014. 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 case. I am not sure who took her out of work to begin with. I do not feel comfortable writing a letter indicating that she should be out of work indefinitely.

## 2014-02-09 NOTE — Progress Notes (Signed)
Quick Note:  Spoke to patient and relayed MRI thoracic results, per Larita FifeLynn. Explained that C6-7 disc protrusion and stenosis likely cause of arm and shoulder pain. Referral has been made to interventional radiology, patient will call to check on appointment scheduling. ______

## 2014-02-09 NOTE — Telephone Encounter (Signed)
Patient was notified that her prescription is ready for pickup at the front desk.  Patient verbalized understanding. 

## 2014-02-09 NOTE — Telephone Encounter (Signed)
I spoke to patient regarding her MRI thoracic spine.  She then relayed that she is due for a refill on her Oxycodone, but wanted the doctor to know that sometimes it doesn't last 6 hours as prescribed.  She also stated that she only takes it when necessary.   The patient is also referred to Interventional radiology for steroid injections.

## 2014-02-09 NOTE — Telephone Encounter (Signed)
Melinda [name] came to the office today to pick up a prescription. She is also requesting that a letter be written saying she is out of work until further notice. She would like this mailed to her home address.

## 2014-02-09 NOTE — Telephone Encounter (Signed)
I called patient. I went over the MRI thoracic spine results with her. The patient is having some ongoing discomfort, having to take oxycodone daily. The epidural steroid injection has been set up, not yet done. I will increase the strength of the oxycodone to the 7.5 mg tablets.   MRI thoracic spine 02/09/2014:  IMPRESSION: Mildly abnormal MRI thoracic spine showing mild disc degenerative changes without frank cord compression or significant root or foraminal encroachment. Incidental postop changes of anterior cervical fusion at C5-6 with prominent central disc protrusion at C6-7 with spinal stenosis are noted .

## 2014-02-10 ENCOUNTER — Other Ambulatory Visit: Payer: Self-pay | Admitting: Nurse Practitioner

## 2014-02-10 ENCOUNTER — Inpatient Hospital Stay (HOSPITAL_COMMUNITY): Admission: RE | Admit: 2014-02-10 | Payer: Self-pay | Source: Ambulatory Visit

## 2014-02-10 DIAGNOSIS — M898X1 Other specified disorders of bone, shoulder: Secondary | ICD-10-CM

## 2014-02-10 DIAGNOSIS — G8929 Other chronic pain: Secondary | ICD-10-CM

## 2014-02-10 DIAGNOSIS — M542 Cervicalgia: Secondary | ICD-10-CM

## 2014-02-10 DIAGNOSIS — R200 Anesthesia of skin: Secondary | ICD-10-CM

## 2014-02-10 DIAGNOSIS — M79602 Pain in left arm: Secondary | ICD-10-CM

## 2014-02-10 NOTE — Telephone Encounter (Signed)
Spoke to patient and relayed that the doctor was not comfortable writing a letter indicating she should be out of work indefinitely.  Patient understood and stated she has been out of work since November 2014.    Also her referral to radiology went to incorrect provider.  [facility]  I have received fax confirmation.

## 2014-02-16 ENCOUNTER — Telehealth (HOSPITAL_COMMUNITY): Payer: Self-pay | Admitting: Interventional Radiology

## 2014-02-16 ENCOUNTER — Ambulatory Visit
Admission: RE | Admit: 2014-02-16 | Discharge: 2014-02-16 | Disposition: A | Discharge status: Home / Self Care | Payer: 59 | Source: Ambulatory Visit | Attending: Nurse Practitioner | Admitting: Nurse Practitioner

## 2014-02-16 DIAGNOSIS — M898X1 Other specified disorders of bone, shoulder: Secondary | ICD-10-CM

## 2014-02-16 DIAGNOSIS — M79602 Pain in left arm: Secondary | ICD-10-CM

## 2014-02-16 DIAGNOSIS — R200 Anesthesia of skin: Secondary | ICD-10-CM

## 2014-02-16 DIAGNOSIS — G8929 Other chronic pain: Secondary | ICD-10-CM

## 2014-02-16 DIAGNOSIS — M542 Cervicalgia: Secondary | ICD-10-CM

## 2014-02-16 MED ORDER — IOHEXOL 300 MG/ML  SOLN: 1.0000 mL | Freq: Once | INTRAMUSCULAR | Status: AC | PRN

## 2014-02-16 MED ORDER — TRIAMCINOLONE ACETONIDE 40 MG/ML IJ SUSP (RADIOLOGY): 60.0000 mg | Freq: Once | INTRAMUSCULAR | Status: DC

## 2014-02-16 NOTE — Telephone Encounter (Signed)
Pt called, said she would call me back to reschedule her consult appt with Deveshwar. JM

## 2014-02-16 NOTE — Discharge Instructions (Signed)

## 2014-02-17 ENCOUNTER — Ambulatory Visit (INDEPENDENT_AMBULATORY_CARE_PROVIDER_SITE_OTHER): Payer: 59 | Admitting: Family Medicine

## 2014-02-17 ENCOUNTER — Telehealth: Payer: Self-pay

## 2014-02-17 VITALS — BP 126/74 | HR 83 | Temp 98.1°F | Resp 16 | Ht 65.0 in | Wt 133.0 lb

## 2014-02-17 DIAGNOSIS — G542 Cervical root disorders, not elsewhere classified: Secondary | ICD-10-CM

## 2014-02-17 DIAGNOSIS — M531 Cervicobrachial syndrome: Secondary | ICD-10-CM

## 2014-02-17 NOTE — Patient Instructions (Signed)
Melinda Spanielharles K Willis, MD at 12/04/2013 2:16 PM     Status: Signed        I called the patient. MRI the cervical spine does not show clear evidence of nerve root impingement. The patient has neck pain, and discomfort down the left arm. I'll get nerve conduction studies of both arms, and EMG of the left arm. The patient has not yet heard about physical therapy.  MRI cervical spine 12/04/2013:  Impression   Abnormal MRI cervical spine (without) demonstrating: 1. At C6-7: broad disc bulging with mild spinal stenosis and moderate  biforaminal foraminal stenosis; no cord signal abnormalities 2. Anterior cervical spine fusion with metal hardware at C5-C6

## 2014-02-17 NOTE — Telephone Encounter (Addendum)
error 

## 2014-02-17 NOTE — Progress Notes (Signed)
This chart was scribed for Melinda SidleKurt Lauenstein, MD by Nicholos Johnsenise [name], Medical Scribe. This patient's care was started at 7:20 PM.  Patient ID: Melinda [name] MRN: [id number], DOB: August 20, 1956, 58 y.o. Date of Encounter: 02/17/2014, 7:20 PM  Primary Physician: Melinda SidleLAUENSTEIN,KURT, MD  Chief Complaint:   HPI: 58 y.o. year old female with history below presents for a work note saying she is unable to return to work due to injury. 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 Pt hit her head and was unable to remember the circumstances surrounding the accident. Following the accident and return home from the hospital, pt was bothered by headaches and pain in the base of the neck. Seen by Dr. Milus [name] 11/08/13 and referred to Dr. Anne [name] in neurology. Has been going to physical therapy since. Has been taking Neurontin, Percocet, and Flexeril as prescribed and states it only helps for 2 hours and then pain comes back. Received Cortisone shot in the neck yesterday. States she was in a tremendous amount of pain yesterday. Was told to come back and see Dr. Milus [name] by Dr. Anne [name] to obtain a note for work. Pt has 10 grandchildren and 1 great grandchild.  Past Medical History  Diagnosis Date   Reflux    Sprain of neck 11/24/2013     Home Meds: Prior to Admission medications   Medication Sig Start Date End Date Taking? Authorizing Provider  cyclobenzaprine (FLEXERIL) 10 MG tablet Take 1 tablet (10 mg total) by mouth 2 (two) times daily as needed for muscle spasms. 01/20/14  Yes York Spanielharles K Willis, MD  gabapentin (NEURONTIN) 300 MG capsule Take 1 capsule (300 mg total) by mouth 3 (three) times daily. 11/24/13  Yes York Spanielharles K Willis, MD  oxyCODONE-acetaminophen (PERCOCET) 7.5-325 MG per tablet Take 1 tablet by mouth every 6 (six) hours as needed for pain. 02/09/14  Yes York Spanielharles K Willis, MD  acetaminophen (TYLENOL) 500 MG tablet Take 500 mg by mouth every  6 (six) hours as needed.    Historical Provider, MD  prednisoLONE acetate (PRED FORTE) 1 % ophthalmic suspension Place 1 % into both eyes daily. 11/14/13   Historical Provider, MD    Allergies:  Allergies  Allergen Reactions   Aspirin Anaphylaxis   Succinylcholine Anaphylaxis    coma   Tramadol     History   Social History   Marital Status: Single    Spouse Name: N/A    Number of Children: 4   Years of Education: 12   Occupational History   [employer] worker, [employer]   Social History Main Topics   Smoking status: Former Smoker    Types: Cigarettes   Smokeless tobacco: Never Used   Alcohol Use: No   Drug Use: No   Sexual Activity: Not on file   Other Topics Concern   Not on file   Social History Narrative   Patient lives at home with daughter.    Patient is single.    Patient has 4 children.    Patient has a high school education.    Patient is right handed.   Patient is currently unemployed.            Review of Systems: Constitutional: negative for chills, fever, night sweats, weight changes, or fatigue  HEENT: negative for vision changes, hearing loss, congestion, rhinorrhea, ST, epistaxis, or sinus pressure Cardiovascular: negative for chest pain or palpitations Respiratory: negative  for hemoptysis, wheezing, shortness of breath, or cough Abdominal: negative for abdominal pain, nausea, vomiting, diarrhea, or constipation Dermatological: negative for rash Neurologic: negative for headache, dizziness, or syncope All other systems reviewed and are otherwise negative with the exception to those above and in the HPI.   Physical Exam: Blood pressure 126/74, pulse 83, temperature 98.1 F (36.7 C), temperature source Oral, resp. rate 16, height 5\' 5"  (1.651 m), weight 133 lb (60.328 kg), SpO2 99.00%., Body mass index is 22.13 kg/(m^2). General: Well developed, well nourished, in no acute distress. Head: Normocephalic, atraumatic, eyes without  discharge, sclera non-icteric, nares are without discharge. Bilateral auditory canals clear, TM's are without perforation, pearly grey and translucent with reflective cone of light bilaterally. Oral cavity moist, posterior pharynx without exudate, erythema, peritonsillar abscess, or post nasal drip.  Neck: Supple. No thyromegaly. Full ROM. No lymphadenopathy. Lungs: Clear bilaterally to auscultation without wheezes, rales, or rhonchi. Breathing is unlabored. Heart: RRR with S1 S2. No murmurs, rubs, or gallops appreciated. Abdomen: Soft, non-tender, non-distended with normoactive bowel sounds. No hepatomegaly. No rebound/guarding. No obvious abdominal masses. Msk:  Strength and tone normal for age. Extremities/Skin: Warm and dry. No clubbing or cyanosis. No edema. No rashes or suspicious lesions. Neuro: Alert and oriented X 3. Moves all extremities spontaneously. Gait is normal. CNII-XII grossly in tact. Psych:  Responds to questions appropriately with a normal affect.   I spent one half hour with the patient talking about various imaging strategies and treatment strategies. I really don't think further cortisone shots will help her. Instead I think she needs concentrated physical therapy with deep tissue massage.  Therefore I will be referring her.   ASSESSMENT AND PLAN:  58 y.o. 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

## 2014-02-18 ENCOUNTER — Ambulatory Visit: Payer: Self-pay

## 2014-02-20 ENCOUNTER — Ambulatory Visit (HOSPITAL_COMMUNITY): Admission: RE | Admit: 2014-02-20 | Payer: 59 | Source: Ambulatory Visit

## 2014-02-26 ENCOUNTER — Ambulatory Visit: Payer: Self-pay

## 2014-03-02 ENCOUNTER — Other Ambulatory Visit: Payer: Self-pay | Admitting: *Deleted

## 2014-03-02 MED ORDER — OXYCODONE-ACETAMINOPHEN 7.5-325 MG PO TABS: 1.0000 | ORAL_TABLET | Freq: Four times a day (QID) | ORAL | Status: DC | PRN

## 2014-03-02 NOTE — Telephone Encounter (Signed)
Patient was notified that her rx is ready for pickup at the front desk.  Patient verbalized understanding. 

## 2014-03-02 NOTE — Telephone Encounter (Signed)
Patient calling requesting refill for oxyCODONE-acetaminophen (PERCOCET) 7.5-325 MG per tablet.  Thank you

## 2014-03-02 NOTE — Telephone Encounter (Signed)
Today is the 21st day.  Request sent to provider for approval

## 2014-03-25 ENCOUNTER — Other Ambulatory Visit: Payer: Self-pay | Admitting: Neurology

## 2014-03-25 MED ORDER — OXYCODONE-ACETAMINOPHEN 7.5-325 MG PO TABS: 1.0000 | ORAL_TABLET | Freq: Four times a day (QID) | ORAL | Status: DC | PRN

## 2014-03-25 NOTE — Telephone Encounter (Signed)
Patient requesting refill of oxyCODONE-acetaminophen (PERCOCET) 7.5-325 MG per tablet.  Thanks

## 2014-03-25 NOTE — Telephone Encounter (Signed)
Called pt to inform her that her Rx was ready to be picked up at the front desk and if she has any other problems, questions or concerns to call the office. Pt verbalized understanding. °

## 2014-03-25 NOTE — Telephone Encounter (Signed)
Request forwarded to provider for approval  

## 2014-04-14 ENCOUNTER — Telehealth: Payer: Self-pay | Admitting: Neurology

## 2014-04-14 MED ORDER — OXYCODONE-ACETAMINOPHEN 7.5-325 MG PO TABS: 1.0000 | ORAL_TABLET | Freq: Four times a day (QID) | ORAL | Status: DC | PRN

## 2014-04-14 NOTE — Telephone Encounter (Signed)
I called the patient back.  States she cannot sleep due to pain in her neck and shoulder.  She tried [facility], but it was not helpful.  Patient would like a Rx for something stronger that will help with this pain, because she feels OTC products will not be effective.  As well, she is asking for a refill on Oxycodone (today is the 21st day) and Flexeril.  Please advise.  Thank you.

## 2014-04-14 NOTE — Telephone Encounter (Signed)
Patient requesting refill on Percocet & Flexeril, also wants to know if he can prescribe a cream for her neck & shoulder

## 2014-04-14 NOTE — Telephone Encounter (Signed)
I called patient. It is time for her oxycodone refill, I will handle this. The patient also wants to know if there is something stronger than over-the-counter ointments used for neuromuscular pain. I'll try one of the compounded ointments for her.

## 2014-04-15 ENCOUNTER — Telehealth: Payer: Self-pay | Admitting: Neurology

## 2014-04-15 NOTE — Telephone Encounter (Signed)
This medication is a specialty drug that cannot be filled at the local pharmacy.  It goes through mail order.  I called the patient back.  Explained it will arrive via mail.  She verbalized understanding and will call us back if anything further is needed.

## 2014-04-15 NOTE — Telephone Encounter (Signed)
Patient calling regarding compound Ointment Rx Dr. [name] was calling in to [facility].  Please call and advise

## 2014-04-15 NOTE — Telephone Encounter (Signed)
Called pt and left message informing her that her Rx was ready to be picked up at the front desk and if she has any other problems, questions or concerns to call the office.  °

## 2014-05-08 ENCOUNTER — Other Ambulatory Visit: Payer: Self-pay

## 2014-05-08 ENCOUNTER — Telehealth: Payer: Self-pay | Admitting: Neurology

## 2014-05-08 MED ORDER — OXYCODONE-ACETAMINOPHEN 7.5-325 MG PO TABS: 1.0000 | ORAL_TABLET | Freq: Four times a day (QID) | ORAL | Status: DC | PRN

## 2014-05-08 NOTE — Telephone Encounter (Signed)
Please refer to [id number]

## 2014-05-08 NOTE — Telephone Encounter (Signed)
Called pt to inform her that her Rx was ready to be picked up at the front desk and if she has any other problems, questions or concerns to call the office. Pt verbalized understanding. °

## 2014-05-30 HISTORY — PX: OTHER SURGICAL HISTORY: SHX169

## 2014-06-02 ENCOUNTER — Telehealth: Payer: Self-pay | Admitting: Neurology

## 2014-06-02 MED ORDER — CYCLOBENZAPRINE HCL 10 MG PO TABS: 10.0000 mg | ORAL_TABLET | Freq: Two times a day (BID) | ORAL | Status: DC | PRN

## 2014-06-02 MED ORDER — OXYCODONE-ACETAMINOPHEN 7.5-325 MG PO TABS: 1.0000 | ORAL_TABLET | Freq: Four times a day (QID) | ORAL | Status: DC | PRN

## 2014-06-02 NOTE — Telephone Encounter (Signed)
I called patient. She needs a refill on her Percocet, and her Flexeril. The insurance does not cover the compounded ointment as it is not made in [place] . She cannot afford the medication.

## 2014-06-02 NOTE — Telephone Encounter (Signed)
Patient calling to request refill of muscle relaxant and her Percocet, states she is completely out. Patient also has a question about the compound cream that Dr. [name] had prescribed, states she hasn't heard anything regarding that yet. Please return call to patient and advise.

## 2014-06-02 NOTE — Telephone Encounter (Signed)
I called the compound pharmacy at [phone].  Spoke with [facility].  She said they have called the patient 3 times.  Call #1, they left message.  Call #2 patient said she was driving and couldn't talk.  Call #3, patient stated she was getting her hair done.  They also mailed the patient a letter asking that she call them back.  They are unable to proceed with request until they speak with the patient,  Rx is currently on hold pending the patient contacting them.  I called the patient.  Relayed this info.  She does remember them calling, but never returned their call.  I provided her with the phone number, and she said she will contact them.   Refill requests have been forwarded to the provider for approval.

## 2014-06-03 NOTE — Telephone Encounter (Signed)
Called pt and left message informing her that her Rx was ready to be picked up at the front desk and if she has any other problems, questions or concerns to call the office.  °

## 2014-06-21 ENCOUNTER — Emergency Department (HOSPITAL_BASED_OUTPATIENT_CLINIC_OR_DEPARTMENT_OTHER): Payer: 59

## 2014-06-21 ENCOUNTER — Encounter (HOSPITAL_BASED_OUTPATIENT_CLINIC_OR_DEPARTMENT_OTHER): Payer: Self-pay | Admitting: Emergency Medicine

## 2014-06-21 ENCOUNTER — Emergency Department (HOSPITAL_BASED_OUTPATIENT_CLINIC_OR_DEPARTMENT_OTHER)
Admission: EM | Admit: 2014-06-21 | Discharge: 2014-06-21 | Disposition: A | Discharge status: Home / Self Care | Payer: 59 | Attending: Emergency Medicine | Admitting: Emergency Medicine

## 2014-06-21 DIAGNOSIS — W108XXA Fall (on) (from) other stairs and steps, initial encounter: Secondary | ICD-10-CM | POA: Diagnosis not present

## 2014-06-21 DIAGNOSIS — S93609A Unspecified sprain of unspecified foot, initial encounter: Secondary | ICD-10-CM | POA: Diagnosis not present

## 2014-06-21 DIAGNOSIS — S99919A Unspecified injury of unspecified ankle, initial encounter: Secondary | ICD-10-CM

## 2014-06-21 DIAGNOSIS — Z87891 Personal history of nicotine dependence: Secondary | ICD-10-CM | POA: Insufficient documentation

## 2014-06-21 DIAGNOSIS — Z79899 Other long term (current) drug therapy: Secondary | ICD-10-CM | POA: Diagnosis not present

## 2014-06-21 DIAGNOSIS — Z8719 Personal history of other diseases of the digestive system: Secondary | ICD-10-CM | POA: Insufficient documentation

## 2014-06-21 DIAGNOSIS — IMO0002 Reserved for concepts with insufficient information to code with codable children: Secondary | ICD-10-CM | POA: Insufficient documentation

## 2014-06-21 DIAGNOSIS — Y929 Unspecified place or not applicable: Secondary | ICD-10-CM | POA: Diagnosis not present

## 2014-06-21 DIAGNOSIS — S99929A Unspecified injury of unspecified foot, initial encounter: Secondary | ICD-10-CM

## 2014-06-21 DIAGNOSIS — Y9301 Activity, walking, marching and hiking: Secondary | ICD-10-CM | POA: Diagnosis not present

## 2014-06-21 DIAGNOSIS — S8990XA Unspecified injury of unspecified lower leg, initial encounter: Secondary | ICD-10-CM | POA: Insufficient documentation

## 2014-06-21 DIAGNOSIS — S93601A Unspecified sprain of right foot, initial encounter: Secondary | ICD-10-CM

## 2014-06-21 MED ORDER — OXYCODONE-ACETAMINOPHEN 5-325 MG PO TABS
1.0000 | ORAL_TABLET | Freq: Once | ORAL | Status: AC
  Administered 2014-06-21: 1 via ORAL
  Filled 2014-06-21: qty 1

## 2014-06-21 MED ORDER — OXYCODONE-ACETAMINOPHEN 5-325 MG PO TABS: 1.0000 | ORAL_TABLET | ORAL | Status: DC | PRN

## 2014-06-21 NOTE — ED Notes (Signed)
Patient ready for discharge. 

## 2014-06-21 NOTE — Discharge Instructions (Signed)
°Cryotherapy °Cryotherapy is when you put ice on your injury. Ice helps lessen pain and puffiness (swelling) after an injury. Ice works the best when you start using it in the first 24 to 48 hours after an injury. °HOME CARE °· Put a dry or damp towel between the ice pack and your skin. °· You may press gently on the ice pack. °· Leave the ice on for no more than 10 to 20 minutes at a time. °· Check your skin after 5 minutes to make sure your skin is okay. °· Rest at least 20 minutes between ice pack uses. °· Stop using ice when your skin loses feeling (numbness). °· Do not use ice on someone who cannot tell you when it hurts. This includes small children and people with memory problems (dementia). °GET HELP RIGHT AWAY IF: °· You have white spots on your skin. °· Your skin turns blue or pale. °· Your skin feels waxy or hard. °· Your puffiness gets worse. °MAKE SURE YOU:  °· Understand these instructions. °· Will watch your condition. °· Will get help right away if you are not doing well or get worse. °Document Released: 04/03/2008 Document Revised: 01/08/2012 Document Reviewed: 06/08/2011 °ExitCare® Patient Information ©2015 [employer]. This information is not intended to replace advice given to you by your health care provider. Make sure you discuss any questions you have with your health care provider. °Foot Sprain °The muscles and cord like structures which attach muscle to bone (tendons) that surround the feet are made up of units. A foot sprain can occur at the weakest spot in any of these units. This condition is most often caused by injury to or overuse of the foot, as from playing contact sports, or aggravating a previous injury, or from poor conditioning, or obesity. °SYMPTOMS °· Pain with movement of the foot. °· Tenderness and swelling at the injury site. °· Loss of strength is present in moderate or severe sprains. °THE THREE GRADES OR SEVERITY OF FOOT SPRAIN ARE: °· Mild (Grade I): Slightly pulled  muscle without tearing of muscle or tendon fibers or loss of strength. °· Moderate (Grade II): Tearing of fibers in a muscle, tendon, or at the attachment to bone, with small decrease in strength. °· Severe (Grade III): Rupture of the muscle-tendon-bone attachment, with separation of fibers. Severe sprain requires surgical repair. Often repeating (chronic) sprains are caused by overuse. Sudden (acute) sprains are caused by direct injury or over-use. °DIAGNOSIS  °Diagnosis of this condition is usually by your own observation. If problems continue, a caregiver may be required for further evaluation and treatment. X-rays may be required to make sure there are not breaks in the bones (fractures) present. Continued problems may require physical therapy for treatment. °PREVENTION °· Use strength and conditioning exercises appropriate for your sport. °· Warm up properly prior to working out. °· Use athletic shoes that are made for the sport you are participating in. °· Allow adequate time for healing. Early return to activities makes repeat injury more likely, and can lead to an unstable arthritic foot that can result in prolonged disability. Mild sprains generally heal in 3 to 10 days, with moderate and severe sprains taking 2 to 10 weeks. Your caregiver can help you determine the proper time required for healing. °HOME CARE INSTRUCTIONS  °· Apply ice to the injury for 15-20 minutes, 03-04 times per day. Put the ice in a plastic bag and place a towel between the bag of ice and your skin. °·   An elastic wrap (like an Ace bandage) may be used to keep swelling down. °· Keep foot above the level of the heart, or at least raised on a footstool, when swelling and pain are present. °· Try to avoid use other than gentle range of motion while the foot is painful. Do not resume use until instructed by your caregiver. Then begin use gradually, not increasing use to the point of pain. If pain does develop, decrease use and continue  the above measures, gradually increasing activities that do not cause discomfort, until you gradually achieve normal use. °· Use crutches if and as instructed, and for the length of time instructed. °· Keep injured foot and ankle wrapped between treatments. °· Massage foot and ankle for comfort and to keep swelling down. Massage from the toes up towards the knee. °· Only take over-the-counter or prescription medicines for pain, discomfort, or fever as directed by your caregiver. °SEEK IMMEDIATE MEDICAL CARE IF:  °· Your pain and swelling increase, or pain is not controlled with medications. °· You have loss of feeling in your foot or your foot turns cold or blue. °· You develop new, unexplained symptoms, or an increase of the symptoms that brought you to your caregiver. °MAKE SURE YOU:  °· Understand these instructions. °· Will watch your condition. °· Will get help right away if you are not doing well or get worse. °Document Released: 04/07/2002 Document Revised: 01/08/2012 Document Reviewed: 06/04/2008 °ExitCare® Patient Information ©2015 [employer]. This information is not intended to replace advice given to you by your health care provider. Make sure you discuss any questions you have with your health care provider. ° °

## 2014-06-21 NOTE — ED Notes (Signed)
I applied a short leg posterior splint with a stirrup using fiberglass material. I first wrapped with cotton webril over sock material, then fiberglass, then large kerlix. I completed with ace. Patient comfort level improved.

## 2014-06-21 NOTE — ED Notes (Signed)
Pt discharged to home with family. NAD.  

## 2014-06-21 NOTE — ED Notes (Signed)
Pain and swelling to right foot after falling down stairs.

## 2014-06-21 NOTE — ED Provider Notes (Signed)
CSN: [id number]     Arrival date & time 06/21/14  1753 History   First MD Initiated Contact with Patient 06/21/14 1839     Chief Complaint  Patient presents with  . Ankle Pain     (Consider location/radiation/quality/duration/timing/severity/associated sxs/prior Treatment) Patient is a 58 y.o. female presenting with ankle pain. The history is provided by the patient. No language interpreter was used.  Ankle Pain Location:  Foot Injury: yes   Mechanism of injury: fall   Fall:    Fall occurred:  Down stairs Foot location:  R foot Dislocation: no   Foreign body present:  No foreign bodies Associated symptoms: no back pain, no fever and no neck pain   Associated symptoms comment:  Foot pain and swelling since fall while walking down stairs earlier today.   Past Medical History  Diagnosis Date  . Reflux   . Sprain of neck 11/24/2013   Past Surgical History  Procedure Laterality Date  . Esophageal dilation    . Cesarean section    . Abdominal hysterectomy    . Cervical fusion     Family History  Problem Relation Age of Onset  . Diabetes Father    History  Substance Use Topics  . Smoking status: Former Smoker    Types: Cigarettes  . Smokeless tobacco: Never Used  . Alcohol Use: No   OB History   Grav Para Term Preterm Abortions TAB SAB Ect Mult Living                 Review of Systems  Constitutional: Negative for fever and chills.  Musculoskeletal: Negative for back pain and neck pain.       See HPI.  Skin: Positive for color change. Negative for wound.  Neurological: Negative.  Negative for numbness.      Allergies  Aspirin; Succinylcholine; and Tramadol  Home Medications   Prior to Admission medications   Medication Sig Start Date End Date Taking? Authorizing Provider  acetaminophen (TYLENOL) 500 MG tablet Take 500 mg by mouth every 6 (six) hours as needed.    Historical Provider, MD  cyclobenzaprine (FLEXERIL) 10 MG tablet Take 1 tablet (10 mg total)  by mouth 2 (two) times daily as needed for muscle spasms. 06/02/14   York Spaniel, MD  gabapentin (NEURONTIN) 300 MG capsule Take 1 capsule (300 mg total) by mouth 3 (three) times daily. 11/24/13   York Spaniel, MD  oxyCODONE-acetaminophen (PERCOCET) 7.5-325 MG per tablet Take 1 tablet by mouth every 6 (six) hours as needed for pain (Must Last 21 Days). 06/02/14   York Spaniel, MD  prednisoLONE acetate (PRED FORTE) 1 % ophthalmic suspension Place 1 % into both eyes daily. 11/14/13   Historical Provider, MD   BP 114/65  Pulse 88  Temp(Src) 99.4 F (37.4 C) (Oral)  Resp 22  Ht  (1.676 m)  Wt 135 lb (61.236 kg)  BMI 21.80 kg/m2  SpO2 97% Physical Exam  Constitutional: She is oriented to person, place, and time. She appears well-developed and well-nourished.  Neck: Normal range of motion.  Pulmonary/Chest: Effort normal.  Musculoskeletal:  Right foot markedly swollen and ecchymotic. Tender generally with greater tenderness dorsolaterally. Ankle stable and non-tender.   Neurological: She is alert and oriented to person, place, and time.  Skin: Skin is warm and dry.    ED Course  Procedures (including critical care time) Labs Review Labs Reviewed - No data to display  Imaging Review Dg Foot Complete Right  06/21/2014   CLINICAL DATA:  Pain post trauma  EXAM: RIGHT FOOT COMPLETE - 3+ VIEW  COMPARISON:  None.  FINDINGS: Frontal, oblique, and lateral views were obtained. There is a tiny avulsion arising from the dorsal distal cuboid medially. No other fracture. No dislocation. There is slight osteoarthritic change in the first MTP joint. There are inferior and posterior calcaneal spurs.  IMPRESSION: Tiny avulsion arising from the dorsal cuboid. No other fracture. No dislocation. Slight osteoarthritic change first MTP joint. Calcaneal spurs present.   Electronically Signed   By: Bretta Bang M.D.   On: 06/21/2014 18:36     EKG Interpretation None      MDM   Final  diagnoses:  None    1. Foot sprain  Posterior splint provided along with crutches and pain management. Ortho referral provided.     Arnoldo Hooker, PA-C 06/21/14 1941

## 2014-06-23 NOTE — ED Provider Notes (Signed)
Medical screening examination/treatment/procedure(s) were performed by non-physician practitioner and as supervising physician I was immediately available for consultation/collaboration.   EKG Interpretation None        Mandy Fitzwater, MD 06/23/14 0731 

## 2014-06-25 ENCOUNTER — Telehealth: Payer: Self-pay | Admitting: Neurology

## 2014-06-25 MED ORDER — OXYCODONE-ACETAMINOPHEN 7.5-325 MG PO TABS: 1.0000 | ORAL_TABLET | Freq: Four times a day (QID) | ORAL | Status: DC | PRN

## 2014-06-25 NOTE — Telephone Encounter (Signed)
Patient requesting Rx refill for oxyCODONE-acetaminophen (PERCOCET) 7.5-325 MG per tablet.  Please call anytime when ready for pick and may leave message on vm.

## 2014-06-25 NOTE — Telephone Encounter (Signed)
We wrote Rx for Percocet 7.5/325mg  on 08/04 #40 (noting must last 21 days), and Elpidio Anis wrote Rx for Percocet 5/325mg  #20 on 08/23.  I called the patient back.  Says she broke her foot and has been taking the pain meds prescribed by Ms Hector Shade.  I asked if she had taken all of the tabs prescribed.  States she has a couple left, maybe 7, but says she will definitely be out of meds this weekend and would like to pick up a new Rx for Percocet 7.5/325mg  from [place] for her neck pain.  Please advise.

## 2014-06-25 NOTE — Telephone Encounter (Signed)
I will rewrite the prescription, but we need to follow any other prescriptions written by the other physician.

## 2014-06-26 NOTE — Telephone Encounter (Signed)
Called pt and left message informing pt that her Rx was ready to be picked up at the front desk and if she has any other problems, questions or concerns to call the office. °

## 2014-06-28 ENCOUNTER — Ambulatory Visit (INDEPENDENT_AMBULATORY_CARE_PROVIDER_SITE_OTHER): Payer: 59 | Admitting: Internal Medicine

## 2014-06-28 VITALS — BP 110/70 | HR 63 | Temp 98.6°F | Resp 16

## 2014-06-28 DIAGNOSIS — S93409A Sprain of unspecified ligament of unspecified ankle, initial encounter: Secondary | ICD-10-CM

## 2014-06-28 DIAGNOSIS — M25571 Pain in right ankle and joints of right foot: Secondary | ICD-10-CM

## 2014-06-28 DIAGNOSIS — M25579 Pain in unspecified ankle and joints of unspecified foot: Secondary | ICD-10-CM

## 2014-06-28 DIAGNOSIS — Z5189 Encounter for other specified aftercare: Secondary | ICD-10-CM

## 2014-06-28 DIAGNOSIS — IMO0001 Reserved for inherently not codable concepts without codable children: Secondary | ICD-10-CM

## 2014-06-28 DIAGNOSIS — S92211D Displaced fracture of cuboid bone of right foot, subsequent encounter for fracture with routine healing: Secondary | ICD-10-CM

## 2014-06-28 DIAGNOSIS — S93401D Sprain of unspecified ligament of right ankle, subsequent encounter: Secondary | ICD-10-CM

## 2014-06-29 NOTE — Progress Notes (Signed)
   Subjective:    Patient ID: Melinda [name], female    DOB: [birth year], 57 y.o.   MRN: [id number]  HPI she presents one week after a fall which created an ankle injury treated in the emergency room with immobilization and crutches after x-rays revealed an avulsion fracture of the cuboid She cannot bear any weight She is unsure what to do with her lack of progress  C. history of other orthopedic dilemmas for which she remains out of work  Patient Active Problem List   Diagnosis Date Noted  . Sprain of neck 11/24/2013   Current outpatient prescriptions:acetaminophen (TYLENOL) 500 MG tablet, Take 500 mg by mouth every 6 (six) hours as needed., Disp: , Rfl: ;  oxyCODONE-acetaminophen (PERCOCET) 7.5-325 MG per tablet, Take 1 tablet by mouth every 6 (six) hours as needed for pain (Must Last 21 Days)., Disp: 40 tablet, Rfl: 0 oxyCODONE-acetaminophen (PERCOCET/ROXICET) 5-325 MG per tablet, Take 1-2 tablets by mouth every 4 (four) hours as needed for severe pain., Disp: 20 tablet, Rfl: 0   Review of Systems She's had no loss of sensation in this extremity There were no other injuries it emerged after the fall    Objective:   Physical Exam BP 110/70  Pulse 63  Temp(Src) 98.6 F (37 C) (Oral)  Resp 16  SpO2 100% No acute distress Her bulky dressing with posterior splinting was removed and the ankle examined She remains very swollen over the lateral ankle and forefoot with marked tenderness to palpation over the proximal fifth metatarsal and the entire dorsum of the foot She is never tender over the distal fibula Pain with any ankle range of motion drawer does not appear unstable Pulses good/sensation intact  X-rays from the emergency room are reviewed showing a small avulsion fracture but no obvious lis franc or other fracture     Assessment & Plan:  Moderate to severe ankle sprain with cuboid avulsion/May need to rule out internal derangement as well  Referred orthopedics for  followup Fitted with a long CAM Continue nonweightbearing Remain out of work until orthopedic disposition

## 2014-07-21 ENCOUNTER — Telehealth: Payer: Self-pay | Admitting: Neurology

## 2014-07-21 NOTE — Telephone Encounter (Signed)
Events noted, I appreciate the call from the patient.

## 2014-07-21 NOTE — Telephone Encounter (Signed)
FYI, Patient wanted to inform Dr. Patrecia Pace that she broke her foot 5 weeks ago.  Dr. Rosaura Carpenter prescribed oxyCODONE-acetaminophen (PERCOCET/ROXICET) 5-325 MG per tablet for pain.  Will use Rx up prescribed by Orthopedist before she request refill for oxyCODONE-acetaminophen (PERCOCET) 7.5-325 MG per tablet  as prescribed by Dr. Anne Hahn.

## 2014-07-21 NOTE — Telephone Encounter (Signed)
See phone note 07/21/14.

## 2014-07-24 ENCOUNTER — Other Ambulatory Visit: Payer: Self-pay | Admitting: Orthopaedic Surgery

## 2014-07-24 DIAGNOSIS — M25571 Pain in right ankle and joints of right foot: Secondary | ICD-10-CM

## 2014-07-27 ENCOUNTER — Other Ambulatory Visit: Payer: Self-pay | Admitting: Orthopaedic Surgery

## 2014-07-27 DIAGNOSIS — M25571 Pain in right ankle and joints of right foot: Secondary | ICD-10-CM

## 2014-07-30 ENCOUNTER — Ambulatory Visit
Admission: RE | Admit: 2014-07-30 | Discharge: 2014-07-30 | Disposition: A | Discharge status: Home / Self Care | Payer: 59 | Source: Ambulatory Visit | Attending: Orthopaedic Surgery | Admitting: Orthopaedic Surgery

## 2014-07-30 ENCOUNTER — Other Ambulatory Visit: Payer: 59

## 2014-07-30 ENCOUNTER — Other Ambulatory Visit: Payer: Self-pay

## 2014-07-30 DIAGNOSIS — M25571 Pain in right ankle and joints of right foot: Secondary | ICD-10-CM

## 2014-08-13 ENCOUNTER — Other Ambulatory Visit: Payer: Self-pay | Admitting: Neurology

## 2014-08-13 MED ORDER — OXYCODONE-ACETAMINOPHEN 7.5-325 MG PO TABS: 1.0000 | ORAL_TABLET | Freq: Four times a day (QID) | ORAL | Status: DC | PRN

## 2014-08-13 MED ORDER — CYCLOBENZAPRINE HCL 10 MG PO TABS: 10.0000 mg | ORAL_TABLET | Freq: Two times a day (BID) | ORAL | Status: DC

## 2014-08-13 NOTE — Telephone Encounter (Signed)
Called pt to inform her that her Rx was ready to be picked up at the front desk and if she has any other problems, questions or concerns to call the office. Pt verbalized understanding. °

## 2014-08-13 NOTE — Telephone Encounter (Signed)
Patient requesting refill of Percocet and also her muscle relaxer, please call when ready for pick up. Patient states she's in a lot of pain and can't sleep at night.

## 2014-08-13 NOTE — Telephone Encounter (Signed)
Request forwarded to provider for approval  

## 2014-08-14 DIAGNOSIS — Z0271 Encounter for disability determination: Secondary | ICD-10-CM

## 2014-09-03 ENCOUNTER — Telehealth: Payer: Self-pay | Admitting: Neurology

## 2014-09-03 ENCOUNTER — Other Ambulatory Visit: Payer: Self-pay | Admitting: Neurology

## 2014-09-03 MED ORDER — OXYCODONE-ACETAMINOPHEN 7.5-325 MG PO TABS: 1.0000 | ORAL_TABLET | Freq: Four times a day (QID) | ORAL | Status: DC | PRN

## 2014-09-03 NOTE — Telephone Encounter (Signed)
Patient is calling to get a written Rx for Percocet. Please call patient when ready for pickup. Patient has an appointment 09-08-14. Thank you.

## 2014-09-03 NOTE — Telephone Encounter (Signed)
Request entered, forwarded to provider for approval.  

## 2014-09-03 NOTE — Addendum Note (Signed)
Addended by: Arlis PortaHUGHES, Shloime Keilman on: 09/03/2014 04:24 PM   Modules accepted: Medications

## 2014-09-03 NOTE — Telephone Encounter (Signed)
I called the patient to let them know their Rx for Oxycodone was ready for pickup. Patient was instructed to bring Photo ID. 

## 2014-09-03 NOTE — Telephone Encounter (Signed)
I just wanted you to be aware that it looks like this patient is getting pain medication from other providers according the pharmacy notification. Hydrocodone was filled on 07-21-14 for #60 but you haven't prescribed it since 11/24/13. On 06/21/14 Dr. Elpidio AnisShari Upstill Rx Oxycodone #20. The patient last got Oxycodone from you on 06/03/14 for #40 and then got another Rx today for #40.   The patient was last seen by Larita FifeLynn on 01/28/14 and was supposed to have followed up in 6 months. The patient last saw you on 11/24/13. I am only making you aware of this because when I called to notify the patient that the Rx was ready for pick up, her voicemail stated that her name was Melinda [name] and not [name]. This triggered me to investigate.  This is an FYI only    We may need to clarify who is giving her prescriptions when she asked for the next refill.she is to be seen on 09/08/2014 in this office.

## 2014-09-08 ENCOUNTER — Ambulatory Visit (INDEPENDENT_AMBULATORY_CARE_PROVIDER_SITE_OTHER): Payer: 59 | Admitting: Neurology

## 2014-09-08 ENCOUNTER — Encounter: Payer: Self-pay | Admitting: Neurology

## 2014-09-08 VITALS — BP 119/74 | HR 70 | Ht 66.0 in | Wt 143.6 lb

## 2014-09-08 DIAGNOSIS — S139XXD Sprain of joints and ligaments of unspecified parts of neck, subsequent encounter: Secondary | ICD-10-CM

## 2014-09-08 NOTE — Patient Instructions (Signed)

## 2014-09-08 NOTE — Progress Notes (Signed)
Reason for visit: neck discomfort  Melinda [name] Portal is an 58 y.o. female  History of present illness:  Ms. Melinda [name] is a 58 year old right-handed white female with a history of prior cervical spine surgery associated with a reinjury. The patient has had chronic neck discomfort going down into the right shoulder blade area. The patient is on Flexeril which does help some. The patient is taking oxycodone if needed for pain. The patient is applying for disability. 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 She indicates that this is a Research scientist (physical sciences)Workmen's Compensation case. The patient returns to this office for an evaluation.   Past Medical History  Diagnosis Date  . Reflux   . Sprain of neck 11/24/2013    Past Surgical History  Procedure Laterality Date  . Esophageal dilation    . Cesarean section    . Abdominal hysterectomy    . Cervical fusion    . Broke foot Right August 2015    Family History  Problem Relation Age of Onset  . Diabetes Father     Social history:  reports that she has been smoking Cigarettes.  She has been smoking about 0.00 packs per day. She has never used smokeless tobacco. She reports that she does not drink alcohol or use illicit drugs.    Allergies  Allergen Reactions  . Aspirin Anaphylaxis  . Succinylcholine Anaphylaxis    coma  . Tramadol     Medications:  Current Outpatient Prescriptions on File Prior to Visit  Medication Sig Dispense Refill  . acetaminophen (TYLENOL) 500 MG tablet Take 500 mg by mouth every 6 (six) hours as needed.    . cyclobenzaprine (FLEXERIL) 10 MG tablet Take 1 tablet (10 mg total) by mouth 2 (two) times daily. 60 tablet 3  . HYDROcodone-acetaminophen (NORCO/VICODIN) 5-325 MG per tablet   0  . oxyCODONE-acetaminophen (PERCOCET) 7.5-325 MG per tablet Take 1 tablet by mouth every 6 (six) hours as needed for pain (Must Last 21 Days). 40 tablet 0   No current facility-administered medications on file prior to  visit.    ROS:  Out of a complete 14 system review of symptoms, the patient complains only of the following symptoms, and all other reviewed systems are negative.  Insomnia Neck discomfort  Blood pressure 119/74, pulse 70, height 5\' 6"  (1.676 m), weight 143 lb 9.6 oz (65.137 kg).  Physical Exam  General: The patient is alert and cooperative at the time of the examination.  Neuromuscular: The patient lacks about 20 of full lateral rotation of the cervical spine bilaterally.  Skin: No significant peripheral edema is noted.   Neurologic Exam  Mental status: The patient is oriented x 3.  Cranial nerves: Facial symmetry is present. Speech is normal, no aphasia or dysarthria is noted. Extraocular movements are full. Visual fields are full.  Motor: The patient has good strength in all 4 extremities.  Sensory examination: Soft touch sensation of face, arms, and legs is symmetric.  Coordination: The patient has good finger-nose-finger and heel-to-shin bilaterally.  Gait and station: The patient has a normal gait. Tandem gait is normal. Romberg is negative. No drift is seen.  Reflexes: Deep tendon reflexes are symmetric.   Assessment/Plan:  1. Cervical strain  The patient continues to have discomfort in the neck. The patient will continue the Flexeril for now, she will continue stretching exercises and she will follow-up in about 6 months. Cymbalta or Lyrica may be added in the future.  Marlan Palau. Keith Carrol Bondar MD 09/08/2014 6:25 PM  [facility] [address] [place], [place] [id number]  Phone [phone] Fax [phone]

## 2014-09-28 ENCOUNTER — Other Ambulatory Visit: Payer: Self-pay | Admitting: Neurology

## 2014-09-28 MED ORDER — OXYCODONE-ACETAMINOPHEN 7.5-325 MG PO TABS
1.0000 | ORAL_TABLET | Freq: Four times a day (QID) | ORAL | Status: DC | PRN
Start: 2014-09-28 — End: 2014-10-19

## 2014-09-28 NOTE — Telephone Encounter (Signed)
Patient is calling to get a written Rx for Percocet. Please call patient when ready for pickup. Thank you. °

## 2014-09-28 NOTE — Telephone Encounter (Signed)
Request entered, forwarded to provider for approval.  I called the pharmacy and spoke with Selena BattenKim, pharmacist.  She verified the patient is not getting meds from any other provider.  Patient did notify us on 09/22, per notes, that her orthopedist prescribed pain meds, and she agreed to finish those before requesting a refill from us.

## 2014-09-29 NOTE — Telephone Encounter (Signed)
I called the patient to let them know their Rx for Oxycodone was ready for pickup. Patient was instructed to bring Photo ID. 

## 2014-10-19 ENCOUNTER — Other Ambulatory Visit: Payer: Self-pay | Admitting: Neurology

## 2014-10-19 NOTE — Telephone Encounter (Signed)
Dr Willis is out of the office.  Request entered, forwarded to WID for approval.  

## 2014-10-19 NOTE — Telephone Encounter (Signed)
Patient requesting Rx refill for oxyCODONE-acetaminophen (PERCOCET) 7.5-325 MG per tablet.  Please call when ready for pick up. °

## 2014-10-20 MED ORDER — OXYCODONE-ACETAMINOPHEN 7.5-325 MG PO TABS
1.0000 | ORAL_TABLET | Freq: Four times a day (QID) | ORAL | Status: DC | PRN
Start: 2014-10-20 — End: 2014-11-09

## 2014-10-20 NOTE — Telephone Encounter (Signed)
Left message that Percocet is ready for pick up.

## 2014-11-09 ENCOUNTER — Other Ambulatory Visit: Payer: Self-pay | Admitting: Neurology

## 2014-11-09 MED ORDER — OXYCODONE-ACETAMINOPHEN 7.5-325 MG PO TABS
1.0000 | ORAL_TABLET | Freq: Four times a day (QID) | ORAL | Status: DC | PRN
Start: 2014-11-09 — End: 2014-11-30

## 2014-11-09 NOTE — Telephone Encounter (Signed)
Request entered, forwarded to provider for approval.  

## 2014-11-09 NOTE — Telephone Encounter (Signed)
Patient requesting Rx refill for oxyCODONE-acetaminophen (PERCOCET) 7.5-325 MG per tablet.  Please call when ready for pick up. °

## 2014-11-09 NOTE — Telephone Encounter (Signed)
I called the patient to let them know their Rx for Oxycodone was ready for pickup. Patient was instructed to bring Photo ID. 

## 2014-11-19 ENCOUNTER — Other Ambulatory Visit: Payer: Self-pay | Admitting: Family Medicine

## 2014-11-19 ENCOUNTER — Telehealth: Payer: Self-pay

## 2014-11-19 DIAGNOSIS — M25519 Pain in unspecified shoulder: Secondary | ICD-10-CM

## 2014-11-19 NOTE — Telephone Encounter (Signed)
Dr.Lauenstein, Pt states that her neck and shoulder is in pain, pt would like to have a referral to a physical therapist.  Best# [phone]

## 2014-11-19 NOTE — Telephone Encounter (Signed)
Physical therapist consult made

## 2014-11-30 ENCOUNTER — Other Ambulatory Visit: Payer: Self-pay | Admitting: Neurology

## 2014-11-30 MED ORDER — OXYCODONE-ACETAMINOPHEN 7.5-325 MG PO TABS: 1.0000 | ORAL_TABLET | Freq: Four times a day (QID) | ORAL | Status: DC | PRN

## 2014-11-30 NOTE — Telephone Encounter (Signed)
Request entered, forwarded to provider for approval.  

## 2014-11-30 NOTE — Telephone Encounter (Signed)
Pt is calling requesting refill for oxyCODONE-acetaminophen (PERCOCET) 7.5-325 MG per tablet. Please call and when ready for pick up.

## 2014-12-01 ENCOUNTER — Telehealth: Payer: Self-pay

## 2014-12-01 NOTE — Telephone Encounter (Signed)
Called patient to inform Rx ready for pick up at front desk. No answer. Left vmail. 

## 2014-12-21 ENCOUNTER — Other Ambulatory Visit: Payer: Self-pay | Admitting: Neurology

## 2014-12-21 ENCOUNTER — Telehealth: Payer: Self-pay | Admitting: *Deleted

## 2014-12-21 MED ORDER — OXYCODONE-ACETAMINOPHEN 7.5-325 MG PO TABS: 1.0000 | ORAL_TABLET | Freq: Four times a day (QID) | ORAL | Status: DC | PRN

## 2014-12-21 NOTE — Telephone Encounter (Signed)
Patient requesting Rx refill for oxyCODONE-acetaminophen (PERCOCET) 7.5-325 MG per tablet.  Please call when ready for pick up. °

## 2014-12-21 NOTE — Telephone Encounter (Signed)
Spoke with the pt on the phone and informed her that we have her percocet prescription ready at the front. She stated a thank you and said she would come pick it up tomorrow.

## 2015-01-08 ENCOUNTER — Telehealth: Payer: Self-pay | Admitting: *Deleted

## 2015-01-08 ENCOUNTER — Other Ambulatory Visit: Payer: Self-pay | Admitting: *Deleted

## 2015-01-08 MED ORDER — OXYCODONE-ACETAMINOPHEN 7.5-325 MG PO TABS: 1.0000 | ORAL_TABLET | Freq: Four times a day (QID) | ORAL | Status: DC | PRN

## 2015-01-08 NOTE — Telephone Encounter (Signed)
Rx was last prescribed on 02/22, noting must last 21 days.  The 21st day will be Sunday.

## 2015-01-08 NOTE — Telephone Encounter (Signed)
Please refer to chart [id number]

## 2015-01-11 ENCOUNTER — Other Ambulatory Visit: Payer: Self-pay | Admitting: *Deleted

## 2015-01-11 ENCOUNTER — Telehealth: Payer: Self-pay

## 2015-01-11 NOTE — Telephone Encounter (Signed)
Patient returning call.

## 2015-01-11 NOTE — Telephone Encounter (Signed)
Tad MooreCasandra spoke with patient and advised Rx was ready for pick up.

## 2015-01-11 NOTE — Telephone Encounter (Signed)
Dr [name] already approved Rx.

## 2015-01-11 NOTE — Telephone Encounter (Signed)
Called patient and informed Rx ready for pick up at front desk. Patient verbalized understanding.  

## 2015-01-26 DIAGNOSIS — Z0289 Encounter for other administrative examinations: Secondary | ICD-10-CM

## 2015-01-28 ENCOUNTER — Telehealth: Payer: Self-pay

## 2015-01-28 DIAGNOSIS — S92901S Unspecified fracture of right foot, sequela: Secondary | ICD-10-CM

## 2015-01-28 NOTE — Telephone Encounter (Signed)
Left message for pt to call back  °

## 2015-01-28 NOTE — Telephone Encounter (Signed)
Patient is requesting a referral to a foot doctor - in Dr. [facility] - orthopedic - [phone]  [place]   [phone]  Synetta FailAnita

## 2015-01-28 NOTE — Telephone Encounter (Signed)
Patient returned call stating she wanted to be referred for her right foot. Per patient it is broken and she wants to see [facility] Candis [facility] phone number is [phone]. Patients call back number is [phone]

## 2015-01-28 NOTE — Telephone Encounter (Signed)
Pt states she went to Dr Rayburn MaBlackmon. She states it is not healing right. She states the bone is still sticking out. She would like to go to this Dr Candis Shineeasdall that someone recommended to her.

## 2015-01-28 NOTE — Telephone Encounter (Signed)
We haven't seen pt for foot fx since 05/2014. We can't refer pt, right?

## 2015-01-28 NOTE — Telephone Encounter (Signed)
According to Dr. Netta Corriganoolittle's note, she was referred to ortho when he saw her. Can you find out if she went? Is this a second opinion?

## 2015-01-28 NOTE — Telephone Encounter (Signed)
Referral for what, need diagnosis.

## 2015-01-29 NOTE — Addendum Note (Signed)
Addended by: Olean ReeGESSNER, DEBORAH B on: 01/29/2015 07:15 AM   Modules accepted: Orders

## 2015-01-29 NOTE — Telephone Encounter (Signed)
Please call patient and tell her the referral has been ordered.

## 2015-01-29 NOTE — Telephone Encounter (Signed)
Left VM letting pt know 

## 2015-02-01 ENCOUNTER — Other Ambulatory Visit: Payer: Self-pay | Admitting: Neurology

## 2015-02-01 MED ORDER — OXYCODONE-ACETAMINOPHEN 7.5-325 MG PO TABS: 1.0000 | ORAL_TABLET | Freq: Four times a day (QID) | ORAL | Status: DC | PRN

## 2015-02-01 NOTE — Telephone Encounter (Signed)
Pt is calling requesting a written Rx for oxyCODONE-acetaminophen (PERCOCET) 7.5-325 MG per tablet. Please call when ready for pick up.

## 2015-02-02 ENCOUNTER — Telehealth: Payer: Self-pay

## 2015-02-02 NOTE — Telephone Encounter (Signed)
Great.  Thanks

## 2015-02-02 NOTE — Telephone Encounter (Signed)
Called patient to inform Rx ready for pick up at front desk. No answer. Will try later.  

## 2015-02-02 NOTE — Telephone Encounter (Signed)
Pt called back, I informed pt that her Rx was ready for pick up at the front desk.

## 2015-02-11 DIAGNOSIS — M79671 Pain in right foot: Secondary | ICD-10-CM | POA: Insufficient documentation

## 2015-02-11 DIAGNOSIS — S99921A Unspecified injury of right foot, initial encounter: Secondary | ICD-10-CM | POA: Insufficient documentation

## 2015-02-12 ENCOUNTER — Other Ambulatory Visit: Payer: Self-pay | Admitting: Oncology

## 2015-02-12 DIAGNOSIS — Z139 Encounter for screening, unspecified: Secondary | ICD-10-CM

## 2015-02-16 ENCOUNTER — Telehealth: Payer: Self-pay

## 2015-02-16 NOTE — Telephone Encounter (Signed)
Patient will work with the insurance company on the balance. She will call us back when this is fixed.. Thanks, Angie

## 2015-02-16 NOTE — Telephone Encounter (Signed)
Reviewed

## 2015-02-16 NOTE — Telephone Encounter (Signed)
Patient is scheduled with Melinda [name] on 03-10-2015 that apt has been Cx. Melinda [name] will not be here. Patient has a balance of $245.00 . I stated to patient she will need to speak to billing before I can schedule apt. Patient was fine with this.  Angie please let me know when I can get patient rescheduled with Melinda Jonesarolyn. Patient is waiting on your phone call. Original  Apt. With Melinda Jonesarolyn 03/10/15. CM/Willis.

## 2015-02-16 NOTE — Telephone Encounter (Signed)
Patient called stating she has a large bill with [facility] Stephanie [facility]harles [facility] because she was told she never was referred. I asked her what insurance she has and she told me [facility]. I explained to her with this insurance no matter if she has seen a provider a thousand times she still would need insurance authorization through her PCP to have it covered. She was seen by Dr [name] last here several times and only one visit was covered. I did fax over to Angie in their billing office the authorization we had done for patient on 02/02/2014 that would have covered her to 08/04/2014 (AUTH# [id number]). I also did one for today 02/16/15 that would cover patient to 08/18/2015 (AUTH#[id number]). I advised patient to contact [employer] to see if she can appeal to have her other visits last year covered. I told her at this point I couldn't do anything on my end. Patient agreed she would contact [employer].

## 2015-02-18 ENCOUNTER — Ambulatory Visit (INDEPENDENT_AMBULATORY_CARE_PROVIDER_SITE_OTHER): Payer: 59 | Admitting: Family Medicine

## 2015-02-18 ENCOUNTER — Encounter: Payer: Self-pay | Admitting: Family Medicine

## 2015-02-18 VITALS — BP 127/83 | HR 73 | Temp 98.2°F | Resp 16 | Ht 65.5 in | Wt 136.0 lb

## 2015-02-18 DIAGNOSIS — G8929 Other chronic pain: Secondary | ICD-10-CM

## 2015-02-18 DIAGNOSIS — M542 Cervicalgia: Secondary | ICD-10-CM

## 2015-02-18 DIAGNOSIS — F329 Major depressive disorder, single episode, unspecified: Secondary | ICD-10-CM

## 2015-02-18 DIAGNOSIS — G44229 Chronic tension-type headache, not intractable: Secondary | ICD-10-CM | POA: Diagnosis not present

## 2015-02-18 DIAGNOSIS — F32A Depression, unspecified: Secondary | ICD-10-CM

## 2015-02-18 MED ORDER — OXYCODONE-ACETAMINOPHEN 7.5-325 MG PO TABS: 1.0000 | ORAL_TABLET | Freq: Three times a day (TID) | ORAL | Status: DC | PRN

## 2015-02-18 MED ORDER — MIRTAZAPINE 30 MG PO TABS: 30.0000 mg | ORAL_TABLET | Freq: Every day | ORAL | Status: DC

## 2015-02-18 NOTE — Progress Notes (Signed)
Chief Complaint  Patient presents with  . personal issues  59 yo who fell a year and a half ago and injured her neck.  She has applied for disability because of the chronic neck pain and headaches.  H/O cervical spine fusion.  Daily headches. Pain radiates down right arm  She is discouraged.  Hasn't been able to exercise or go back to work.  She lives with relatives now.  Also, she fell last August and is in process of getting right foot evaluated and treated for Lisfranc injury.   CLINICAL DATA: Trauma 7 weeks ago. Fall down stairs. Dorsal foot pain and swelling. Ankle pain. Lisfranc injury. Stress fracture.  EXAM: MRI OF THE RIGHT FOOT WITHOUT CONTRAST  TECHNIQUE: Multiplanar, multisequence MR imaging of the Foot was performed. No intravenous contrast was administered.  COMPARISON: None.  FINDINGS: TENDONS  Peroneal: Normal.  Posteromedial: Normal.  Anterior: Intact.  Achilles: Intact.  Plantar Fascia: Intact.  LIGAMENTS  Lateral: Distal syndesmotic membrane, anterior and posterior tibiofibular and talofibular ligaments are intact. Calcaneofibular ligament intact.  Medial: Deltoid ligament intact. Spring ligament sprain with edema.  CARTILAGE  Ankle Joint: Small effusion. No osteochondral lesion the plafond or the talar dome.  Subtalar Joints/Sinus Tarsi: Normal.  Bones: There bone contusions throughout the midfoot and at the first through fourth metatarsal bases. The tiny avulsions on the prior plain film would not be apparent by MRI. On the sagittal T1 weighted images, there appears to be a fracture of the fourth metatarsal base at the tarsometatarsal junction with extension into the tarsometatarsal junction.  The Lisfranc ligament appears amorphous. Rather than the typical band, there is swollen connective tissue in the expected location of the Lisfranc ligament.  IMPRESSION: Constellation of findings compatible with Lisfranc  injury with an amorphous appearance of the Lisfranc ligament consistent with a partial tear. Diffuse midfoot/tarsometatarsal junction bone contusions are also consistent with this type of mechanism. CT should be considered to further assess the bony abnormalities if it will affect management. Additionally, standing weight-bearing views if possible would offer functional assessment of the Lisfranc ligament.   Electronically Signed  By: Andreas NewportGeoffrey Lamke M.D.  On: 07/30/2014 16:48  Objective:  NAD Tender posterior neck with palpation diffusely Neck ROM:  Mildly diminished rotation, pain with extension (looking up)  This chart was scribed in my presence and reviewed by me personally.    ICD-9-CM ICD-10-CM   1. Neck pain of over 3 months duration 723.1 M54.2 Ambulatory referral to Occupational Therapy   338.29 G89.29 oxyCODONE-acetaminophen (PERCOCET) 7.5-325 MG per tablet  2. Chronic tension-type headache, not intractable 339.12 G44.229 Ambulatory referral to Occupational Therapy  3. Depression 311 F32.9 mirtazapine (REMERON) 30 MG tablet     Signed, Elvina SidleKurt Ameer Sanden, MD

## 2015-02-18 NOTE — Patient Instructions (Signed)
Call me for refill or if Remeron (mirtazepine) does not work.

## 2015-03-03 ENCOUNTER — Other Ambulatory Visit: Payer: Self-pay | Admitting: Family Medicine

## 2015-03-03 ENCOUNTER — Ambulatory Visit
Admission: RE | Admit: 2015-03-03 | Discharge: 2015-03-03 | Disposition: A | Discharge status: Home / Self Care | Payer: 59 | Source: Ambulatory Visit | Attending: Oncology | Admitting: Oncology

## 2015-03-03 ENCOUNTER — Inpatient Hospital Stay: Payer: 59 | Attending: Oncology

## 2015-03-03 VITALS — BP 113/77 | HR 72 | Temp 98.6°F

## 2015-03-03 DIAGNOSIS — Z1231 Encounter for screening mammogram for malignant neoplasm of breast: Secondary | ICD-10-CM | POA: Insufficient documentation

## 2015-03-03 DIAGNOSIS — Z0000 Encounter for general adult medical examination without abnormal findings: Secondary | ICD-10-CM

## 2015-03-03 NOTE — Progress Notes (Signed)
Subjective:     Patient ID: Melinda [name], female   DOB: 27-May-1956, 59 y.o.   MRN: [id number]  HPI   Review of Systems     Objective:   Physical Exam  Pulmonary/Chest: Right breast exhibits no inverted nipple, no mass, no nipple discharge, no skin change and no tenderness. Left breast exhibits no inverted nipple, no mass, no nipple discharge, no skin change and no tenderness. Breasts are symmetrical.       Assessment:     [age] year old female returns for annual [facility] screening. States she has insurance. Sent for bilateral screening mammogram.       Plan:     Patient to follow-up with primary physician

## 2015-03-08 ENCOUNTER — Ambulatory Visit: Payer: 59 | Attending: Family Medicine | Admitting: Physical Therapy

## 2015-03-10 ENCOUNTER — Ambulatory Visit: Payer: 59 | Admitting: Nurse Practitioner

## 2015-03-11 NOTE — Progress Notes (Signed)
[facility] to notify of normal mammogram results.  Patient to return in one year for annual screening.  [facility] Breast Data sheet complete.

## 2015-03-12 ENCOUNTER — Other Ambulatory Visit: Payer: Self-pay

## 2015-03-31 ENCOUNTER — Telehealth: Payer: Self-pay

## 2015-03-31 NOTE — Telephone Encounter (Signed)
Refill oxyCODONE-acetaminophen (PERCOCET) 7.5-325 MG per tablet [facility] [employer]   [phone]

## 2015-04-01 ENCOUNTER — Other Ambulatory Visit: Payer: Self-pay

## 2015-04-01 DIAGNOSIS — M542 Cervicalgia: Secondary | ICD-10-CM

## 2015-04-01 DIAGNOSIS — G8929 Other chronic pain: Secondary | ICD-10-CM

## 2015-04-01 NOTE — Telephone Encounter (Signed)
Pt called back and she does not understand why you will not refill this medication. She knows it is not due until Monday. Pt states you can call her. She states you know about her neck and she has been on this for two years.

## 2015-04-01 NOTE — Telephone Encounter (Signed)
This patient needs to get these opioid medications from a pain specialist.  Family physicians are discouraged from prescribing opioids chronically because of the epidemic of deaths from their use. I am on vacation until June 10th.  As of June 1st, I gave up all but urgent care.  If she would like to get the opioids from pain management, I can refer her.

## 2015-04-01 NOTE — Telephone Encounter (Signed)
Left message letting pt know the denial on refill.

## 2015-04-01 NOTE — Telephone Encounter (Signed)
No way I will do this

## 2015-04-01 NOTE — Telephone Encounter (Signed)
Patient is calling to speak with Melinda [name]. Please call! [phone]

## 2015-04-02 NOTE — Telephone Encounter (Signed)
Left message for pt to call back  °

## 2015-04-02 NOTE — Telephone Encounter (Signed)
She agreed to go to pain management but this may take a while for an appt. Can you refill her Oxycodone until she can get in? Referral placed.

## 2015-04-03 NOTE — Telephone Encounter (Signed)
I won't be back until Friday.  If PA will write for bid Percocet 7.5 for 10 days, I will okay it.

## 2015-04-05 MED ORDER — OXYCODONE-ACETAMINOPHEN 7.5-325 MG PO TABS: 1.0000 | ORAL_TABLET | Freq: Two times a day (BID) | ORAL | Status: DC

## 2015-04-05 NOTE — Telephone Encounter (Signed)
Can someone please write this for Dr L? Pt has called to check status. Pended as authorized by Dr L below.

## 2015-04-05 NOTE — Telephone Encounter (Signed)
I have filled.  Contacted patient of refill for 10 days.  (20 tablets).  Patient understands.

## 2015-04-13 ENCOUNTER — Telehealth: Payer: Self-pay

## 2015-04-13 ENCOUNTER — Other Ambulatory Visit: Payer: Self-pay | Admitting: Family Medicine

## 2015-04-13 DIAGNOSIS — M25579 Pain in unspecified ankle and joints of unspecified foot: Secondary | ICD-10-CM

## 2015-04-13 DIAGNOSIS — M47812 Spondylosis without myelopathy or radiculopathy, cervical region: Secondary | ICD-10-CM

## 2015-04-13 MED ORDER — OXYCODONE-ACETAMINOPHEN 5-325 MG PO TABS
1.0000 | ORAL_TABLET | Freq: Three times a day (TID) | ORAL | Status: DC | PRN
Start: 2015-04-13 — End: 2015-04-16

## 2015-04-13 NOTE — Telephone Encounter (Signed)
Pt called requesting refill on her Oxycodone. She states she is waiting for an appt to the pain clinic. Spoke with Dr L and he gave her an Rx for #30 5-325mg s. I called pt to let her know. Pt understood but will come in Friday to see Dr. Milus Glazier to discuss.

## 2015-04-16 ENCOUNTER — Ambulatory Visit (INDEPENDENT_AMBULATORY_CARE_PROVIDER_SITE_OTHER): Payer: 59 | Admitting: Family Medicine

## 2015-04-16 VITALS — BP 124/80 | HR 73 | Temp 98.2°F | Resp 17 | Ht 65.0 in | Wt 140.0 lb

## 2015-04-16 DIAGNOSIS — M25571 Pain in right ankle and joints of right foot: Secondary | ICD-10-CM

## 2015-04-16 DIAGNOSIS — M47812 Spondylosis without myelopathy or radiculopathy, cervical region: Secondary | ICD-10-CM

## 2015-04-16 DIAGNOSIS — M4692 Unspecified inflammatory spondylopathy, cervical region: Secondary | ICD-10-CM | POA: Diagnosis not present

## 2015-04-16 DIAGNOSIS — M25579 Pain in unspecified ankle and joints of unspecified foot: Secondary | ICD-10-CM

## 2015-04-16 MED ORDER — OXYCODONE-ACETAMINOPHEN 5-325 MG PO TABS: 1.0000 | ORAL_TABLET | Freq: Three times a day (TID) | ORAL | Status: DC | PRN

## 2015-04-16 NOTE — Progress Notes (Signed)
° °  Subjective:    Patient ID: Melinda [name], female    DOB: 03-Dec-1955, 59 y.o.   MRN: [id number] This chart was scribed for Melinda Sidle, MD by Littie Deeds, Medical Scribe. This patient was seen in Room 4 and the patient's care was started at 2:41 PM.   HPI HPI Comments: Melinda [name] is a 59 y.o. female who presents to the [facility] for a follow-up for a right foot injury. Patient was told that 3 of the bones did not heal correctly. She is set to have surgery this coming August or September. She is unsure when she will be able to get in to pain management; they are reviewing her records.  Cyst: Patient reports having a cyst on her back. She saw a dermatologist about a year ago.  Right Shoulder/Neck Pain: Patient also reports having neck pain that radiates to her right shoulder. PSHx includes cervical spine fusion following a neck injury.  My last note from 02/18/15: 59 yo who fell a year and a half ago and injured her neck. She has applied for disability because of the chronic neck pain and headaches. H/O cervical spine fusion. Daily headches. Pain radiates down right arm She is discouraged. Hasn't been able to exercise or go back to work. She lives with relatives now. Also, she fell last August and is in process of getting right foot evaluated and treated for Lisfranc injury.   Review of Systems  Musculoskeletal: Positive for arthralgias and neck pain.  Skin:       Cyst        Objective:   Physical Exam CONSTITUTIONAL: Well developed/well nourished HEAD: Normocephalic/atraumatic EYES: EOM/PERRL ENMT: Mucous membranes moist NECK: supple no meningeal signs SPINE: entire spine nontender but the trapezius area on the right is tender CV: S1/S2 noted, no murmurs/rubs/gallops noted LUNGS: Lungs are clear to auscultation bilaterally, no apparent distress ABDOMEN: soft, nontender, no rebound or guarding GU: no cva tenderness NEURO: Pt is awake/alert,  moves all extremitiesx4 EXTREMITIES: pulses normal, full ROM; patient wearing right foot brace. There is no obvious abnormality of the bony structure SKIN: warm, color normal; 1 cm intradermal cyst with minimal induration and no erythema on the right lateral scapular edge PSYCH: no abnormalities of mood noted      Assessment & Plan:   This chart was scribed in my presence and reviewed by me personally.    ICD-9-CM ICD-10-CM   1. Pain in joint, ankle and foot, unspecified laterality 719.47 M25.579 oxyCODONE-acetaminophen (ROXICET) 5-325 MG per tablet     DISCONTINUED: oxyCODONE-acetaminophen (ROXICET) 5-325 MG per tablet  2. Cervical spine arthritis 721.0 M46.92 oxyCODONE-acetaminophen (ROXICET) 5-325 MG per tablet     DISCONTINUED: oxyCODONE-acetaminophen (ROXICET) 5-325 MG per tablet     Signed, Melinda Sidle, MD

## 2015-04-16 NOTE — Patient Instructions (Signed)
The small cyst on your back will probably stay about the same size. It does increase in size or get more painful, we will incise and drain it.  The area of your trapezius muscle is to be massaged several times a day to get the spasm out. Get your daughter or friend and using the heel of their hand rub on the muscle neck solution of her blade and up toward the neck.

## 2015-04-30 DIAGNOSIS — Z0271 Encounter for disability determination: Secondary | ICD-10-CM

## 2015-05-07 ENCOUNTER — Telehealth: Payer: Self-pay

## 2015-05-07 NOTE — Telephone Encounter (Signed)
Pt wants to know if an antibiotic for a tooth infection call be called in. She was very insistent and refused to come into the clinic. She just wanted to see if Dr. Elbert EwingsL would do this for her. [phone]

## 2015-05-07 NOTE — Telephone Encounter (Signed)
Called pt to let her know Dr. Elbert EwingsL is not here until next saturday. Called pt, no answer.

## 2015-05-08 ENCOUNTER — Ambulatory Visit (INDEPENDENT_AMBULATORY_CARE_PROVIDER_SITE_OTHER): Payer: 59 | Admitting: Emergency Medicine

## 2015-05-08 VITALS — BP 120/80 | HR 70 | Temp 97.7°F | Resp 18 | Ht 66.0 in | Wt 139.2 lb

## 2015-05-08 DIAGNOSIS — K047 Periapical abscess without sinus: Secondary | ICD-10-CM

## 2015-05-08 DIAGNOSIS — H00016 Hordeolum externum left eye, unspecified eyelid: Secondary | ICD-10-CM

## 2015-05-08 MED ORDER — ERYTHROMYCIN 5 MG/GM OP OINT: 1.0000 "application " | TOPICAL_OINTMENT | Freq: Every day | OPHTHALMIC | Status: DC

## 2015-05-08 MED ORDER — AMOXICILLIN 875 MG PO TABS: 875.0000 mg | ORAL_TABLET | Freq: Two times a day (BID) | ORAL | Status: DC

## 2015-05-08 MED ORDER — ERYTHROMYCIN 2 % EX OINT: TOPICAL_OINTMENT | CUTANEOUS | Status: DC

## 2015-05-08 NOTE — Progress Notes (Signed)
This chart was scribed for Arlyss Queen, MD by Marti Sleigh, Medical Scribe. This patient was seen in Room 14 and the patient's care was started at 12:21 PM.  Chief Complaint:  Chief Complaint  Patient presents with  . Dental Pain    x 2-3 days  . Ear Pain    x 2-3 days  . Eye Problem    feels like she has a film over left eye & irritated    HPI: Melinda [name] is a 59 y.o. female who is here for multiple concerns. Pt states she woke up this morning with a film over her left eye as well as ear pain for the last 3 days, and pain in one of her rear molars for the last three days. Pt denies visual disturbance. Pt denies injury to eye, or foreign body. Pt states she knows that her tooth will have to be pulled. Pt states she is allergic to aspirin, and succinylcholine. Pt does not believe she is allergic to penicillin. Pt denies a hx of allergies.    Past Medical History  Diagnosis Date  . Reflux   . Sprain of neck 11/24/2013   Past Surgical History  Procedure Laterality Date  . Esophageal dilation    . Cesarean section    . Abdominal hysterectomy    . Cervical fusion    . Broke foot Right August 2015  . Breast biopsy Right 02/03/2013    stereotactic biopsy negative  . Breast cyst aspiration Left 2006    negative  . Breast biopsy Left 2006    negative  . Breast biopsy Left 07/14/2009    negative with clip   History   Social History  . Marital Status: Married    Spouse Name: N/A  . Number of Children: 4  . Years of Education: 12   Occupational History  . [place] History Main Topics  . Smoking status: Former Smoker    Types: Cigarettes  . Smokeless tobacco: Never Used  . Alcohol Use: No  . Drug Use: No  . Sexual Activity: Yes   Other Topics Concern  . None   Social History Narrative   Patient lives at home with daughter.    Patient is single.    Patient has 4 children.    Patient has a high school education.    Patient is right  handed.   Patient is currently unemployed.          Family History  Problem Relation Age of Onset  . Diabetes Father   . Breast cancer Maternal Aunt   . Breast cancer Maternal Grandmother    Allergies  Allergen Reactions  . Aspirin Anaphylaxis  . Succinylcholine Anaphylaxis    coma  . Tramadol    Prior to Admission medications   Medication Sig Start Date End Date Taking? Authorizing Provider  cyclobenzaprine (FLEXERIL) 10 MG tablet Take 1 tablet (10 mg total) by mouth 2 (two) times daily. 08/13/14  Yes Kathrynn Ducking, MD  mirtazapine (REMERON) 30 MG tablet Take 1 tablet (30 mg total) by mouth at bedtime. 02/18/15  Yes Robyn Haber, MD  oxyCODONE-acetaminophen (ROXICET) 5-325 MG per tablet Take 1 tablet by mouth every 8 (eight) hours as needed for severe pain. Refill after May 14, 2015 04/16/15  Yes Robyn Haber, MD  acetaminophen (TYLENOL) 500 MG tablet Take 500 mg by mouth every 6 (six) hours as needed.    Historical Provider,  MD     ROS: The patient denies fevers, chills, night sweats, unintentional weight loss, chest pain, palpitations, wheezing, dyspnea on exertion, nausea, vomiting, abdominal pain, dysuria, hematuria, melena, numbness, weakness, or tingling.  All other systems have been reviewed and were otherwise negative with the exception of those mentioned in the HPI and as above.    PHYSICAL EXAM: Filed Vitals:   05/08/15 1132  BP: 120/80  Pulse: 70  Temp: 97.7 F (36.5 C)  Resp: 18   Body mass index is 22.49 kg/(m^2).   General: Alert, no acute distress HEENT:  Normocephalic, atraumatic, oropharynx patent. EOMI, PERRLA. Left lower molar has been broken in the front with purulence around the tooth, and a previous area where cavity has been filled. Eye: There is a redness of the mid portion of the lower lid. [place] normal, disk normal, no purulence noted.  Cardiovascular:  Regular rate and rhythm, no rubs murmurs or gallops.  No Carotid bruits, radial  pulse intact. No pedal edema.  Respiratory: Clear to auscultation bilaterally.  No wheezes, rales, or rhonchi.  No cyanosis, no use of accessory musculature GI: No organomegaly, abdomen is soft and non-tender, positive bowel sounds.  No masses. Skin: No rashes. Neurologic: Facial musculature symmetric. Psychiatric: Patient is appropriate throughout our interaction. Lymphatic: No cervical lymphadenopathy Musculoskeletal: Gait intact.   LABS: Results for orders placed or performed in visit on 09/19/13  CBC with Differential/Platelet  Result Value Ref Range   WBC 8.1 3.6-11.0 x10 3/mm 3   RBC 4.27 3.80-5.20 X10 6/mm 3   HGB 13.5 12.0-16.0 g/dL   HCT 39.5 35.0-47.0 %   MCV 93 80-100 fL   MCH 31.7 26.0-34.0 pg   MCHC 34.3 32.0-36.0 g/dL   RDW 13.1 11.5-14.5 %   Platelet 226 150-440 x10 3/mm 3   Neutrophil % 59.1 %   Lymphocyte % 34.3 %   Monocyte % 4.8 %   Eosinophil % 1.0 %   Basophil % 0.8 %   Neutrophil # 4.8 1.4-6.5 x10 3/mm 3   Lymphocyte # 2.8 1.0-3.6 x10 3/mm 3   Monocyte # 0.4 0.2-0.9 x10 3/mm    Eosinophil # 0.1 0.0-0.7 x10 3/mm 3   Basophil # 0.1 0.0-0.1 x10 3/mm 3  Basic metabolic panel  Result Value Ref Range   Glucose 126 (H) 65-99 mg/dL   BUN 19 (H) 7-18 mg/dL   Creatinine 0.71 0.60-1.30 mg/dL   Sodium 141 136-145 mmol/L   Potassium 3.9 3.5-5.1 mmol/L   Chloride 108 (H) 98-107 mmol/L   Co2 25 21-32 mmol/L   Calcium, Total 9.0 8.5-10.1 mg/dL   Osmolality 285 275-301   Anion Gap 8 7-16   EGFR (African American) >60    EGFR (Non-African Amer.) >60   Troponin I  Result Value Ref Range   Troponin-I < 0.02 ng/mL  CK total and CKMB (cardiac)  Result Value Ref Range   CK, Total 130 21-215 Unit/L   CK-MB 2.4 0.5-3.6 ng/mL  Drug Screen, Urine  Result Value Ref Range   Tricyclic, Ur Screen NEGATIVE Cutoff-1000 ng/mL   Amphetamines, Ur Screen NEGATIVE Cutoff-1000 ng/mL   MDMA (Ecstasy)Ur Screen NEGATIVE Cutoff-500 ng/mL   Cocaine Metabolite,Ur [place] NEGATIVE  Cutoff-300 ng/mL   Opiate, Ur Screen NEGATIVE Cutoff-300 ng/mL   Phencyclidine (PCP) Ur S NEGATIVE Cutoff-25 ng/mL   Cannabinoid 50 Ng, Ur [place] NEGATIVE Cutoff-50 ng/mL   Barbiturates, Ur Screen NEGATIVE Cutoff-200 ng/mL   Methadone, Ur Screen NEGATIVE Cutoff-300 ng/mL   Benzodiazepine, Ur Scrn NEGATIVE Cutoff-200  ng/mL     EKG/XRAY:   Primary read interpreted by Dr. Everlene Farrier at [facility].   ASSESSMENT/PLAN: She definitely has an infected tooth. I could not find much on her eye exam except there was some mild redness in the medial portion of the lower lid. She could be trying to develop a stye in this area. She was given amoxicillin to take for her infected tooth and will follow-up with the dentist. She will use erythromycin ointment for her suspected early stye left eye   Gross sideeffects, risk and benefits, and alternatives of medications d/w patient. Patient is aware that all medications have potential sideeffects and we are unable to predict every sideeffect or drug-drug interaction that may occur.  Arlyss Queen MD 05/08/2015 12:21 PM

## 2015-05-08 NOTE — Patient Instructions (Signed)

## 2015-08-23 NOTE — Telephone Encounter (Signed)
No msg °

## 2016-02-10 DIAGNOSIS — Z981 Arthrodesis status: Secondary | ICD-10-CM | POA: Insufficient documentation

## 2016-03-29 ENCOUNTER — Other Ambulatory Visit: Payer: Self-pay | Admitting: Nurse Practitioner

## 2016-03-30 ENCOUNTER — Other Ambulatory Visit: Payer: Self-pay | Admitting: Nurse Practitioner

## 2016-03-30 DIAGNOSIS — Z1231 Encounter for screening mammogram for malignant neoplasm of breast: Secondary | ICD-10-CM

## 2016-04-07 ENCOUNTER — Ambulatory Visit: Payer: Self-pay

## 2016-04-19 ENCOUNTER — Other Ambulatory Visit: Payer: Self-pay | Admitting: Nurse Practitioner

## 2016-04-19 ENCOUNTER — Ambulatory Visit
Admission: RE | Admit: 2016-04-19 | Discharge: 2016-04-19 | Disposition: A | Discharge status: Home / Self Care | Payer: Medicaid Other | Source: Ambulatory Visit | Attending: Nurse Practitioner | Admitting: Nurse Practitioner

## 2016-04-19 DIAGNOSIS — Z1231 Encounter for screening mammogram for malignant neoplasm of breast: Secondary | ICD-10-CM

## 2016-05-06 ENCOUNTER — Emergency Department (HOSPITAL_COMMUNITY): Payer: Medicaid Other

## 2016-05-06 ENCOUNTER — Encounter (HOSPITAL_COMMUNITY): Payer: Self-pay | Admitting: Emergency Medicine

## 2016-05-06 ENCOUNTER — Emergency Department (HOSPITAL_COMMUNITY)
Admission: EM | Admit: 2016-05-06 | Discharge: 2016-05-06 | Disposition: A | Discharge status: Home / Self Care | Payer: Medicaid Other | Attending: Emergency Medicine | Admitting: Emergency Medicine

## 2016-05-06 DIAGNOSIS — F419 Anxiety disorder, unspecified: Secondary | ICD-10-CM | POA: Insufficient documentation

## 2016-05-06 DIAGNOSIS — Z87891 Personal history of nicotine dependence: Secondary | ICD-10-CM | POA: Insufficient documentation

## 2016-05-06 DIAGNOSIS — R0789 Other chest pain: Secondary | ICD-10-CM | POA: Diagnosis not present

## 2016-05-06 LAB — BASIC METABOLIC PANEL
ANION GAP: 7 (ref 5–15)
BUN: 11 mg/dL (ref 6–20)
CALCIUM: 9.5 mg/dL (ref 8.9–10.3)
CO2: 26 mmol/L (ref 22–32)
Chloride: 104 mmol/L (ref 101–111)
Creatinine, Ser: 0.76 mg/dL (ref 0.44–1.00)
Glucose, Bld: 97 mg/dL (ref 65–99)
Potassium: 3.7 mmol/L (ref 3.5–5.1)
SODIUM: 137 mmol/L (ref 135–145)

## 2016-05-06 LAB — CBC
HEMATOCRIT: 43.7 % (ref 36.0–46.0)
Hemoglobin: 14.6 g/dL (ref 12.0–15.0)
MCH: 31.3 pg (ref 26.0–34.0)
MCHC: 33.4 g/dL (ref 30.0–36.0)
MCV: 93.8 fL (ref 78.0–100.0)
Platelets: 309 10*3/uL (ref 150–400)
RBC: 4.66 MIL/uL (ref 3.87–5.11)
RDW: 14.2 % (ref 11.5–15.5)
WBC: 11.4 10*3/uL — ABNORMAL HIGH (ref 4.0–10.5)

## 2016-05-06 LAB — I-STAT TROPONIN, ED: Troponin i, poc: 0 ng/mL (ref 0.00–0.08)

## 2016-05-06 MED ORDER — LORAZEPAM 1 MG PO TABS
1.0000 mg | ORAL_TABLET | Freq: Once | ORAL | Status: AC
  Administered 2016-05-06: 1 mg via ORAL
  Filled 2016-05-06: qty 1

## 2016-05-06 NOTE — Discharge Instructions (Signed)
1. Medications: usual home medications 2. Treatment: rest, drink plenty of fluids,  3. Follow Up: Please followup with your primary doctor in 2 days for discussion of your diagnoses and further evaluation after today's visit; Please return to the ER for persistent vomiting, high fevers, development of abdominal pain or worsening symptoms

## 2016-05-06 NOTE — ED Notes (Signed)
Pt states she found a mouse in her hamburger and immediately felt chest tightness. Pt denies pain at this time, appears healthy and well, hx of anxiety. Pt in NAD. EKG done in triage

## 2016-05-06 NOTE — ED Provider Notes (Signed)
CSN: [id number]     Arrival date & time 05/06/16  1952 History   First MD Initiated Contact with Patient 05/06/16 2122     Chief Complaint  Patient presents with  . Anxiety     (Consider location/radiation/quality/duration/timing/severity/associated sxs/prior Treatment) The history is provided by the patient and medical records. No language interpreter was used.     Melinda [name] is a 60 y.o. female  with a hx of hysterectomy and cervical fusion presents to the Emergency Department complaining of gradual, persistent, almost completely resolved chest tightness onset 5:30pm after having a severe panic attack.  Patient reports she was eating a hamburger when she bit down on something hard. She reports she is still hamburger and found part of her mouth. She states this caused immediate panic attack. She reports she's had these symptoms in the past and today's panic attack and chest tightness was the same as previous. She reports she had hyperventilation and nausea. She vomited 1. Patient reports her symptoms have almost completely resolved but she begins to feel some chest tightness again when she tells the story. No other known aggravating or alleviating factors.  Pt denies either, chills, headache, neck pain, abdominal pain, diarrhea, weakness, dizziness, syncope.   Pt denies personal or family hx of cardiac disease.     Past Medical History  Diagnosis Date  . Reflux   . Sprain of neck 11/24/2013   Past Surgical History  Procedure Laterality Date  . Esophageal dilation    . Cesarean section    . Abdominal hysterectomy    . Cervical fusion    . Broke foot Right August 2015  . Breast cyst aspiration Left 2006    negative  . Breast biopsy Right 02/03/2013    stereotactic biopsy negative  . Breast biopsy Left 2006    negative  . Breast biopsy Left 07/14/2009    negative with clip   Family History  Problem Relation Age of Onset  . Diabetes Father   . Breast cancer Maternal Aunt   .  Breast cancer Maternal Grandmother    Social History  Substance Use Topics  . Smoking status: Former Smoker    Types: Cigarettes  . Smokeless tobacco: Never Used  . Alcohol Use: No   OB History    Gravida Para Term Preterm AB TAB SAB Ectopic Multiple Living   4 4             Review of Systems  Constitutional: Negative for fever, diaphoresis, appetite change, fatigue and unexpected weight change.  HENT: Negative for mouth sores.   Eyes: Negative for visual disturbance.  Respiratory: Positive for chest tightness. Negative for cough, shortness of breath and wheezing.   Cardiovascular: Negative for chest pain.  Gastrointestinal: Positive for vomiting (x1). Negative for nausea, abdominal pain, diarrhea and constipation.  Endocrine: Negative for polydipsia, polyphagia and polyuria.  Genitourinary: Negative for dysuria, urgency, frequency and hematuria.  Musculoskeletal: Negative for back pain and neck stiffness.  Skin: Negative for rash.  Allergic/Immunologic: Negative for immunocompromised state.  Neurological: Negative for syncope, light-headedness and headaches.  Hematological: Does not bruise/bleed easily.  Psychiatric/Behavioral: Negative for sleep disturbance. The patient is nervous/anxious.       Allergies  Aspirin; Succinylcholine; Tramadol; and Tizanidine  Home Medications   Prior to Admission medications   Medication Sig Start Date End Date Taking? Authorizing Provider  Black Cohosh-SoyIsoflav-Magnol (ESTROVEN MENOPAUSE RELIEF) CAPS Take 1 capsule by mouth 2 (two) times daily.   Yes Historical Provider, MD  ibuprofen (ADVIL,MOTRIN) 200 MG tablet Take 400 mg by mouth every 6 (six) hours as needed for moderate pain.   Yes Historical Provider, MD  Omega-3 1000 MG CAPS Take 1,000 mg by mouth 2 (two) times daily.   Yes Historical Provider, MD  [facility] Wort (V-R [facility] WORT) 300 MG TABS Take 300 mg by mouth daily.   Yes Historical Provider, MD  amoxicillin (AMOXIL) 875 MG  tablet Take 1 tablet (875 mg total) by mouth 2 (two) times daily. 05/08/15   Collene Gobble, MD  cyclobenzaprine (FLEXERIL) 10 MG tablet Take 1 tablet (10 mg total) by mouth 2 (two) times daily. 08/13/14   York Spaniel, MD  erythromycin ophthalmic ointment Place 1 application into the left eye at bedtime. 05/08/15   Collene Gobble, MD  mirtazapine (REMERON) 30 MG tablet Take 1 tablet (30 mg total) by mouth at bedtime. 02/18/15   Elvina Sidle, MD  oxyCODONE-acetaminophen (ROXICET) 5-325 MG per tablet Take 1 tablet by mouth every 8 (eight) hours as needed for severe pain. Refill after May 14, 2015 04/16/15   Elvina Sidle, MD   BP 135/71 mmHg  Pulse 59  Temp(Src) 98.6 F (37 C) (Oral)  Resp 18  SpO2 97% Physical Exam  Constitutional: She appears well-developed and well-nourished. No distress.  Awake, alert, nontoxic appearance  HENT:  Head: Normocephalic and atraumatic.  Mouth/Throat: Oropharynx is clear and moist. No oropharyngeal exudate.  Eyes: Conjunctivae are normal. No scleral icterus.  Neck: Normal range of motion. Neck supple.  Cardiovascular: Normal rate, regular rhythm, normal heart sounds and intact distal pulses.   No murmur heard. Pulmonary/Chest: Effort normal and breath sounds normal. No respiratory distress. She has no wheezes.  Equal chest expansion  Abdominal: Soft. Bowel sounds are normal. She exhibits no mass. There is no tenderness. There is no rebound and no guarding.  Musculoskeletal: Normal range of motion. She exhibits no edema.  Neurological: She is alert.  Speech is clear and goal oriented Moves extremities without ataxia  Skin: Skin is warm and dry. She is not diaphoretic.  Psychiatric: She has a normal mood and affect.  Nursing note and vitals reviewed.   ED Course  Procedures (including critical care time) Labs Review Labs Reviewed  CBC - Abnormal; Notable for the following:    WBC 11.4 (*)    All other components within normal limits  BASIC  METABOLIC PANEL  Rosezena Sensor, ED    Imaging Review Dg Chest 2 View  05/06/2016  CLINICAL DATA:  Chest pain. EXAM: CHEST  2 VIEW COMPARISON:  January 25, 2016 FINDINGS: The heart, hila, and mediastinum are normal. No pneumothorax. No pulmonary nodules, masses, or infiltrates. IMPRESSION: No active cardiopulmonary disease. Electronically Signed   By: Gerome Sam III M.D   On: 05/06/2016 20:30   I have personally reviewed and evaluated these images and lab results as part of my medical decision-making.   EKG Interpretation   Date/Time:  Saturday May 06 2016 20:00:12 EDT Ventricular Rate:  76 PR Interval:  172 QRS Duration: 84 QT Interval:  376 QTC Calculation: 423 R Axis:   69 Text Interpretation:  Normal sinus rhythm Normal ECG since last tracing no  significant change Confirmed by Effie Shy  MD, ELLIOTT [phone]) on 05/06/2016  11:21:37 PM      MDM   Final diagnoses:  Chest pressure  Anxiety   Melinda [name] presents after anxiety attack with chest pressure onset greater than 5 hours ago.  Patient  is to be discharged with recommendation to follow up with PCP in regards to today's hospital visit. Chest pain is not likely of cardiac or pulmonary etiology d/t presentation, PERC negative, VSS, no tracheal deviation, no JVD or new murmur, RRR, breath sounds equal bilaterally, EKG without acute abnormalities, negative troponin, and negative CXR. Pt has been advised to return to the ED if CP becomes exertional, associated with diaphoresis or nausea, radiates to left jaw/arm, worsens or becomes concerning in any way. Also encouraged her to return to the emergency department if she develops abdominal pain, persistent vomiting, fevers or other signs of infection. Pt appears reliable for follow up and is agreeable to discharge.    Dahlia Client Tannisha Kennington, PA-C 05/06/16 1610  Mancel Bale, MD 05/07/16 [phone]

## 2016-05-11 DIAGNOSIS — Z9181 History of falling: Secondary | ICD-10-CM | POA: Insufficient documentation

## 2017-06-28 DIAGNOSIS — G894 Chronic pain syndrome: Secondary | ICD-10-CM | POA: Insufficient documentation

## 2017-06-28 DIAGNOSIS — F1011 Alcohol abuse, in remission: Secondary | ICD-10-CM | POA: Insufficient documentation

## 2017-06-29 DIAGNOSIS — E785 Hyperlipidemia, unspecified: Secondary | ICD-10-CM | POA: Insufficient documentation

## 2017-07-09 ENCOUNTER — Other Ambulatory Visit: Payer: Self-pay | Admitting: Nurse Practitioner

## 2017-07-09 DIAGNOSIS — Z1231 Encounter for screening mammogram for malignant neoplasm of breast: Secondary | ICD-10-CM

## 2017-07-25 ENCOUNTER — Ambulatory Visit
Admission: RE | Admit: 2017-07-25 | Discharge: 2017-07-25 | Disposition: A | Discharge status: Home / Self Care | Payer: Medicare Other | Source: Ambulatory Visit | Attending: Nurse Practitioner | Admitting: Nurse Practitioner

## 2017-07-25 DIAGNOSIS — Z1231 Encounter for screening mammogram for malignant neoplasm of breast: Secondary | ICD-10-CM | POA: Diagnosis present

## 2017-08-13 DIAGNOSIS — M4722 Other spondylosis with radiculopathy, cervical region: Secondary | ICD-10-CM | POA: Insufficient documentation

## 2017-08-13 DIAGNOSIS — F32A Depression, unspecified: Secondary | ICD-10-CM | POA: Insufficient documentation

## 2017-08-13 DIAGNOSIS — Z8719 Personal history of other diseases of the digestive system: Secondary | ICD-10-CM | POA: Insufficient documentation

## 2017-08-13 DIAGNOSIS — F419 Anxiety disorder, unspecified: Secondary | ICD-10-CM | POA: Insufficient documentation

## 2017-08-19 ENCOUNTER — Emergency Department (HOSPITAL_COMMUNITY)
Admission: EM | Admit: 2017-08-19 | Discharge: 2017-08-20 | Disposition: A | Discharge status: Home / Self Care | Payer: No Typology Code available for payment source | Attending: Emergency Medicine | Admitting: Emergency Medicine

## 2017-08-19 ENCOUNTER — Emergency Department (HOSPITAL_COMMUNITY): Payer: No Typology Code available for payment source

## 2017-08-19 ENCOUNTER — Encounter (HOSPITAL_COMMUNITY): Payer: Self-pay | Admitting: Emergency Medicine

## 2017-08-19 DIAGNOSIS — Y9389 Activity, other specified: Secondary | ICD-10-CM | POA: Insufficient documentation

## 2017-08-19 DIAGNOSIS — S62002A Unspecified fracture of navicular [scaphoid] bone of left wrist, initial encounter for closed fracture: Secondary | ICD-10-CM | POA: Insufficient documentation

## 2017-08-19 DIAGNOSIS — Z79899 Other long term (current) drug therapy: Secondary | ICD-10-CM | POA: Insufficient documentation

## 2017-08-19 DIAGNOSIS — Y999 Unspecified external cause status: Secondary | ICD-10-CM | POA: Diagnosis not present

## 2017-08-19 DIAGNOSIS — S6992XA Unspecified injury of left wrist, hand and finger(s), initial encounter: Secondary | ICD-10-CM | POA: Diagnosis present

## 2017-08-19 DIAGNOSIS — R079 Chest pain, unspecified: Secondary | ICD-10-CM | POA: Insufficient documentation

## 2017-08-19 DIAGNOSIS — Q395 Congenital dilatation of esophagus: Secondary | ICD-10-CM | POA: Diagnosis not present

## 2017-08-19 DIAGNOSIS — Y92410 Unspecified street and highway as the place of occurrence of the external cause: Secondary | ICD-10-CM | POA: Diagnosis not present

## 2017-08-19 DIAGNOSIS — Z87891 Personal history of nicotine dependence: Secondary | ICD-10-CM | POA: Insufficient documentation

## 2017-08-19 MED ORDER — OXYCODONE-ACETAMINOPHEN 5-325 MG PO TABS: 2.0000 | ORAL_TABLET | Freq: Once | ORAL | Status: DC

## 2017-08-19 NOTE — ED Notes (Signed)
Pt reports took 15mg  oxy while waiting due to pain, EDP made aware
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
## 2017-08-20 ENCOUNTER — Emergency Department (HOSPITAL_COMMUNITY): Payer: No Typology Code available for payment source

## 2017-08-20 DIAGNOSIS — S62002A Unspecified fracture of navicular [scaphoid] bone of left wrist, initial encounter for closed fracture: Secondary | ICD-10-CM | POA: Diagnosis not present

## 2017-08-20 LAB — I-STAT CHEM 8, ED
BUN: 13 mg/dL (ref 6–20)
CALCIUM ION: 1.13 mmol/L — AB (ref 1.15–1.40)
Chloride: 103 mmol/L (ref 101–111)
Creatinine, Ser: 0.7 mg/dL (ref 0.44–1.00)
Glucose, Bld: 93 mg/dL (ref 65–99)
HEMATOCRIT: 44 % (ref 36.0–46.0)
HEMOGLOBIN: 15 g/dL (ref 12.0–15.0)
Potassium: 3.6 mmol/L (ref 3.5–5.1)
SODIUM: 141 mmol/L (ref 135–145)
TCO2: 27 mmol/L (ref 22–32)

## 2017-08-20 MED ORDER — HYDROCODONE-ACETAMINOPHEN 5-325 MG PO TABS: 1.0000 | ORAL_TABLET | ORAL | 0 refills | Status: DC | PRN

## 2017-08-20 MED ORDER — IOPAMIDOL (ISOVUE-300) INJECTION 61%
INTRAVENOUS | Status: AC
  Administered 2017-08-20: 100 mL
  Filled 2017-08-20: qty 100

## 2017-08-20 MED ORDER — IBUPROFEN 200 MG PO TABS
600.0000 mg | ORAL_TABLET | Freq: Once | ORAL | Status: AC
  Administered 2017-08-20: 600 mg via ORAL
  Filled 2017-08-20: qty 1
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
## 2017-08-20 NOTE — Discharge Instructions (Signed)
Follow up with the hand doctor. Return to the ED if you develop new or worsening symptoms.

## 2017-08-28 ENCOUNTER — Ambulatory Visit
Admission: RE | Admit: 2017-08-28 | Discharge: 2017-08-28 | Disposition: A | Discharge status: Home / Self Care | Payer: Medicare Other | Source: Ambulatory Visit | Attending: Orthopedic Surgery | Admitting: Orthopedic Surgery

## 2017-08-28 ENCOUNTER — Other Ambulatory Visit: Payer: Self-pay | Admitting: Orthopedic Surgery

## 2017-08-28 DIAGNOSIS — S62002A Unspecified fracture of navicular [scaphoid] bone of left wrist, initial encounter for closed fracture: Secondary | ICD-10-CM

## 2017-11-16 DIAGNOSIS — S62009A Unspecified fracture of navicular [scaphoid] bone of unspecified wrist, initial encounter for closed fracture: Secondary | ICD-10-CM | POA: Insufficient documentation

## 2017-11-27 DIAGNOSIS — Z4889 Encounter for other specified surgical aftercare: Secondary | ICD-10-CM | POA: Insufficient documentation

## 2018-02-13 ENCOUNTER — Ambulatory Visit: Payer: Medicare Other | Attending: Orthopedic Surgery | Admitting: Occupational Therapy

## 2018-02-13 ENCOUNTER — Encounter: Payer: Self-pay | Admitting: Occupational Therapy

## 2018-02-13 ENCOUNTER — Other Ambulatory Visit: Payer: Self-pay

## 2018-02-13 ENCOUNTER — Encounter: Payer: Self-pay | Admitting: Physical Therapy

## 2018-02-13 ENCOUNTER — Ambulatory Visit: Payer: Medicare Other | Admitting: Physical Therapy

## 2018-02-13 DIAGNOSIS — M5441 Lumbago with sciatica, right side: Secondary | ICD-10-CM | POA: Insufficient documentation

## 2018-02-13 DIAGNOSIS — M6281 Muscle weakness (generalized): Secondary | ICD-10-CM

## 2018-02-13 DIAGNOSIS — G8929 Other chronic pain: Secondary | ICD-10-CM

## 2018-02-13 DIAGNOSIS — M79642 Pain in left hand: Secondary | ICD-10-CM

## 2018-02-13 DIAGNOSIS — M25642 Stiffness of left hand, not elsewhere classified: Secondary | ICD-10-CM | POA: Diagnosis present

## 2018-02-13 DIAGNOSIS — M25632 Stiffness of left wrist, not elsewhere classified: Secondary | ICD-10-CM | POA: Diagnosis present

## 2018-02-13 DIAGNOSIS — M25532 Pain in left wrist: Secondary | ICD-10-CM

## 2018-02-13 NOTE — Patient Instructions (Signed)
Pt to wear pre-fab wrist splint most all the time  Off with HEP 3 x day and ADL's   Heat and pain free AROM  Wrist flexion ext, RD, UD  Opposition to all digits and slide down 5th  And thumb PA and RA AROM  10 reps  3 x day
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
## 2018-02-19 ENCOUNTER — Ambulatory Visit: Payer: Medicare Other | Admitting: Occupational Therapy

## 2018-02-19 ENCOUNTER — Ambulatory Visit: Payer: Medicare Other | Admitting: Physical Therapy

## 2018-02-21 ENCOUNTER — Ambulatory Visit: Payer: Medicare Other | Admitting: Physical Therapy

## 2018-02-21 ENCOUNTER — Ambulatory Visit: Payer: Medicare Other | Admitting: Occupational Therapy

## 2018-02-26 ENCOUNTER — Ambulatory Visit: Payer: Medicare Other | Admitting: Occupational Therapy

## 2018-02-26 ENCOUNTER — Encounter: Payer: Medicare Other | Admitting: Occupational Therapy

## 2018-02-26 ENCOUNTER — Ambulatory Visit: Payer: Medicare Other | Admitting: Physical Therapy

## 2018-02-26 ENCOUNTER — Encounter: Payer: Medicare Other | Admitting: Physical Therapy

## 2018-02-28 ENCOUNTER — Ambulatory Visit: Payer: Medicare Other | Admitting: Physical Therapy

## 2018-02-28 ENCOUNTER — Encounter: Payer: Medicare Other | Admitting: Occupational Therapy

## 2018-03-01 ENCOUNTER — Ambulatory Visit: Payer: Medicare Other | Admitting: Occupational Therapy

## 2018-03-05 ENCOUNTER — Ambulatory Visit: Payer: Medicare Other | Admitting: Occupational Therapy

## 2018-03-05 ENCOUNTER — Ambulatory Visit: Payer: Medicare Other | Admitting: Physical Therapy

## 2018-03-07 ENCOUNTER — Ambulatory Visit: Payer: Medicare Other | Admitting: Physical Therapy

## 2018-03-07 ENCOUNTER — Ambulatory Visit: Payer: Medicare Other | Admitting: Occupational Therapy

## 2018-03-12 ENCOUNTER — Encounter: Payer: Medicare Other | Admitting: Physical Therapy

## 2018-03-12 ENCOUNTER — Encounter: Payer: Medicare Other | Admitting: Occupational Therapy

## 2018-03-14 ENCOUNTER — Encounter: Payer: Medicare Other | Admitting: Occupational Therapy

## 2018-03-14 ENCOUNTER — Encounter: Payer: Medicare Other | Admitting: Physical Therapy

## 2018-03-18 ENCOUNTER — Encounter: Payer: Medicare Other | Admitting: Physical Therapy

## 2018-03-19 ENCOUNTER — Encounter: Payer: Medicare Other | Admitting: Physical Therapy

## 2018-03-19 ENCOUNTER — Encounter: Payer: Medicare Other | Admitting: Occupational Therapy

## 2018-03-21 ENCOUNTER — Encounter: Payer: Medicare Other | Admitting: Occupational Therapy

## 2018-03-21 ENCOUNTER — Encounter: Payer: Medicare Other | Admitting: Physical Therapy

## 2018-03-26 ENCOUNTER — Encounter: Payer: Medicare Other | Admitting: Occupational Therapy

## 2018-03-26 ENCOUNTER — Encounter: Payer: Medicare Other | Admitting: Physical Therapy

## 2018-03-28 ENCOUNTER — Encounter: Payer: Medicare Other | Admitting: Occupational Therapy

## 2018-03-29 ENCOUNTER — Encounter: Payer: Medicare Other | Admitting: Physical Therapy

## 2018-04-04 ENCOUNTER — Encounter: Payer: Medicare Other | Admitting: Physical Therapy

## 2018-08-28 DIAGNOSIS — Z0000 Encounter for general adult medical examination without abnormal findings: Secondary | ICD-10-CM | POA: Insufficient documentation

## 2018-11-27 ENCOUNTER — Other Ambulatory Visit: Payer: Self-pay | Admitting: Family Medicine

## 2018-11-27 DIAGNOSIS — Z1231 Encounter for screening mammogram for malignant neoplasm of breast: Secondary | ICD-10-CM

## 2018-12-23 ENCOUNTER — Inpatient Hospital Stay: Admission: RE | Admit: 2018-12-23 | Payer: Medicare Other | Source: Ambulatory Visit

## 2019-04-24 ENCOUNTER — Ambulatory Visit
Admission: RE | Admit: 2019-04-24 | Discharge: 2019-04-24 | Disposition: A | Discharge status: Home / Self Care | Payer: Medicare Other | Source: Ambulatory Visit | Attending: Family Medicine | Admitting: Family Medicine

## 2019-04-24 ENCOUNTER — Other Ambulatory Visit: Payer: Self-pay

## 2019-04-24 DIAGNOSIS — Z1231 Encounter for screening mammogram for malignant neoplasm of breast: Secondary | ICD-10-CM | POA: Insufficient documentation

## 2020-05-18 ENCOUNTER — Encounter: Payer: Self-pay | Admitting: Podiatry

## 2020-05-18 ENCOUNTER — Ambulatory Visit (INDEPENDENT_AMBULATORY_CARE_PROVIDER_SITE_OTHER): Payer: Medicare Other

## 2020-05-18 ENCOUNTER — Other Ambulatory Visit: Payer: Self-pay

## 2020-05-18 ENCOUNTER — Ambulatory Visit (INDEPENDENT_AMBULATORY_CARE_PROVIDER_SITE_OTHER): Payer: Medicare Other | Admitting: Podiatry

## 2020-05-18 VITALS — BP 143/75 | HR 76 | Temp 98.3°F | Resp 16

## 2020-05-18 DIAGNOSIS — M722 Plantar fascial fibromatosis: Secondary | ICD-10-CM | POA: Diagnosis not present

## 2020-05-18 DIAGNOSIS — S93401A Sprain of unspecified ligament of right ankle, initial encounter: Secondary | ICD-10-CM

## 2020-05-18 NOTE — Patient Instructions (Signed)

## 2020-05-18 NOTE — Progress Notes (Signed)
  Subjective:  Patient ID: Melinda [name], female    DOB: 10/24/1956,  MRN: [id number]  Chief Complaint  Patient presents with  . Foot Injury    Right foot; Bottom of heel; pt stated, "Larey Seat in shower last Tuesday, swelling has gone down"; x1 week    64 y.o. female presents with the above complaint. History confirmed with patient. She inverted her ankle, this is feeling better. Severe plantar heel pain since then that is not improving  Objective:  Physical Exam: warm, good capillary refill, no trophic changes or ulcerative lesions, normal DP and PT pulses and normal sensory exam. Left Foot: point tenderness over the heel pad, no lateral ankle instability or tenderness, no bruising, no pain along PT/peroneal/Achilles tendons       Radiographs: X-ray of the right foot: no fracture, dislocation, swelling or degenerative changes noted and plantar calcaneal spur Assessment:   1. Plantar fasciitis of right foot   2. Sprain of right ankle, unspecified ligament, initial encounter      Plan:  Patient was evaluated and treated and all questions answered.  Plantar Fasciitis -XR reviewed with patient -Educated patient on stretching and icing of the affected limb, rehab exercises provided -Plantar fascial brace dispensed -Injection delivered to the plantar fascia of the right foot with 0.5cc 2% xylocaine plain, 0.5cc 0.5% marcaine plain, 5mg  kenalog, 2mg  dexamethasone.  Ankle sprain -Reviewed RICE protocol and she is improving, continue to monitor   Return in about 4 weeks (around 06/15/2020) for recheck plantar fasciitis.

## 2020-06-02 ENCOUNTER — Telehealth: Payer: Self-pay | Admitting: Podiatry

## 2020-06-02 NOTE — Telephone Encounter (Signed)
Pt was seen in office on 05/18/20 and had injections for plantar fasciitis. Pt states her pain in getting worse and would like to know if there anything the nurse can suggest for her.  Please give patient a call.

## 2020-06-02 NOTE — Telephone Encounter (Signed)
Left message informing pt she should rest, go into a more supportive shoe like [employer] Runners or the high numbered of the brand, ice aggressively at least 3-4 times daily for 15-20 minutes/session protecting the skin from the ice with a light cloth, and since she could not take antiinflammatories RICE was to be the best therapy.

## 2020-06-11 ENCOUNTER — Ambulatory Visit (INDEPENDENT_AMBULATORY_CARE_PROVIDER_SITE_OTHER): Payer: Medicare Other | Admitting: Podiatry

## 2020-06-11 ENCOUNTER — Other Ambulatory Visit: Payer: Self-pay

## 2020-06-11 DIAGNOSIS — M722 Plantar fascial fibromatosis: Secondary | ICD-10-CM

## 2020-06-11 MED ORDER — METHYLPREDNISOLONE 4 MG PO TBPK: ORAL_TABLET | ORAL | 0 refills | Status: DC

## 2020-06-13 NOTE — Progress Notes (Signed)
  Subjective:  Patient ID: Melinda [name], female    DOB: Apr 20, 1956,  MRN: [id number]    64 y.o. female presents with the above complaint. History confirmed with patient. No longer having pain in the ankle, the injection helped with the heel pain but she is still having some tenderness.  Objective:  Physical Exam: warm, good capillary refill, no trophic changes or ulcerative lesions, normal DP and PT pulses and normal sensory exam. Left Foot: point tenderness over the heel pad, no lateral ankle instability or tenderness, no bruising, no pain along PT/peroneal/Achilles tendons     Assessment:   1. Plantar fasciitis of right foot      Plan:  Patient was evaluated and treated and all questions answered.  Plantar Fasciitis -XR reviewed with patient -We will refer her to [facility] here -Prescription sent for Medrol taper Dosepak  A total of 30 minutes was spent today on the review of the patients medical record including previous laboratory values, imaging studies, taking of the history, examining the patient, the ordering of procedures/labs/tests/medications, coordination of care, and documentation in the chart. I spent approximately 15 minutes of the visit today discussing with her the COVID-19 vaccinations, sharing my experience with it, and counseling her on her apprehension in regards to getting it. She will consider taking the vaccine and I encouraged her to speak with her other doctors about this as well.    Return in about 1 month (around 07/12/2020).

## 2020-06-14 NOTE — Addendum Note (Signed)
Addended by: Troy Sine on: 06/14/2020 09:36 AM   Modules accepted: Orders

## 2020-07-18 ENCOUNTER — Encounter (HOSPITAL_BASED_OUTPATIENT_CLINIC_OR_DEPARTMENT_OTHER): Payer: Self-pay

## 2020-07-18 ENCOUNTER — Emergency Department (HOSPITAL_BASED_OUTPATIENT_CLINIC_OR_DEPARTMENT_OTHER)
Admission: EM | Admit: 2020-07-18 | Discharge: 2020-07-18 | Disposition: A | Discharge status: Home / Self Care | Payer: Medicare Other | Attending: Emergency Medicine | Admitting: Emergency Medicine

## 2020-07-18 ENCOUNTER — Other Ambulatory Visit: Payer: Self-pay

## 2020-07-18 DIAGNOSIS — U071 COVID-19: Secondary | ICD-10-CM | POA: Diagnosis not present

## 2020-07-18 DIAGNOSIS — Z87891 Personal history of nicotine dependence: Secondary | ICD-10-CM | POA: Insufficient documentation

## 2020-07-18 DIAGNOSIS — R509 Fever, unspecified: Secondary | ICD-10-CM | POA: Diagnosis present

## 2020-07-18 DIAGNOSIS — J1282 Pneumonia due to coronavirus disease 2019: Secondary | ICD-10-CM | POA: Diagnosis not present

## 2020-07-18 LAB — CBC
HCT: 47.2 % — ABNORMAL HIGH (ref 36.0–46.0)
Hemoglobin: 15.5 g/dL — ABNORMAL HIGH (ref 12.0–15.0)
MCH: 30.3 pg (ref 26.0–34.0)
MCHC: 32.8 g/dL (ref 30.0–36.0)
MCV: 92.4 fL (ref 80.0–100.0)
Platelets: 188 10*3/uL (ref 150–400)
RBC: 5.11 MIL/uL (ref 3.87–5.11)
RDW: 13.8 % (ref 11.5–15.5)
WBC: 5 10*3/uL (ref 4.0–10.5)
nRBC: 0 % (ref 0.0–0.2)

## 2020-07-18 LAB — BASIC METABOLIC PANEL
Anion gap: 14 (ref 5–15)
BUN: 12 mg/dL (ref 8–23)
CO2: 24 mmol/L (ref 22–32)
Calcium: 8.6 mg/dL — ABNORMAL LOW (ref 8.9–10.3)
Chloride: 99 mmol/L (ref 98–111)
Creatinine, Ser: 0.79 mg/dL (ref 0.44–1.00)
GFR calc Af Amer: >60 mL/min (ref >60)
GFR calc non Af Amer: >60 mL/min (ref >60)
Glucose, Bld: 122 mg/dL — ABNORMAL HIGH (ref 70–99)
Potassium: 3.8 mmol/L (ref 3.5–5.1)
Sodium: 137 mmol/L (ref 135–145)

## 2020-07-18 LAB — TROPONIN I (HIGH SENSITIVITY): Troponin I (High Sensitivity): 7 ng/L (ref <18)

## 2020-07-18 LAB — SARS CORONAVIRUS 2 BY RT PCR (HOSPITAL ORDER, PERFORMED IN ~~LOC~~ HOSPITAL LAB): SARS Coronavirus 2: POSITIVE — AB

## 2020-07-18 MED ORDER — BENZONATATE 100 MG PO CAPS
100.0000 mg | ORAL_CAPSULE | Freq: Once | ORAL | Status: AC
  Administered 2020-07-18: 100 mg via ORAL
  Filled 2020-07-18: qty 1

## 2020-07-18 MED ORDER — ONDANSETRON 4 MG PO TBDP
4.0000 mg | ORAL_TABLET | Freq: Once | ORAL | Status: AC
  Administered 2020-07-18: 4 mg via ORAL
  Filled 2020-07-18: qty 1

## 2020-07-18 MED ORDER — ACETAMINOPHEN 325 MG PO TABS
650.0000 mg | ORAL_TABLET | Freq: Once | ORAL | Status: AC
  Administered 2020-07-18: 650 mg via ORAL
  Filled 2020-07-18: qty 2

## 2020-07-18 MED ORDER — ONDANSETRON 4 MG PO TBDP: 4.0000 mg | ORAL_TABLET | Freq: Three times a day (TID) | ORAL | 0 refills | Status: DC | PRN

## 2020-07-18 MED ORDER — BENZONATATE 100 MG PO CAPS: 100.0000 mg | ORAL_CAPSULE | Freq: Three times a day (TID) | ORAL | 0 refills | Status: DC

## 2020-07-18 NOTE — ED Notes (Signed)
Given po fluids 

## 2020-07-18 NOTE — ED Notes (Signed)
Pt reports taking tylenol about 1 hour PTA, will monitor fever
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
## 2020-07-18 NOTE — ED Notes (Addendum)
Dr. Jacqulyn Bath aware of covid positive results.

## 2020-07-18 NOTE — Discharge Instructions (Addendum)
Please read and follow all provided instructions.  Your diagnoses today include:  1. Pneumonia due to COVID-19 virus     Tests performed today include: Blood cell counts - were normal Electrolytes and kidney function - were normal  Blood test for the heart - no sign of heart attack  Vital signs. See below for your results today.   Medications prescribed:   Zofran (ondansetron) - for nausea and vomiting   Tessalon Perles - cough suppressant medication  Take any prescribed medications only as directed.  Home care instructions:  Follow any educational materials contained in this packet.  BE VERY CAREFUL not to take multiple medicines containing Tylenol (also called acetaminophen). Doing so can lead to an overdose which can damage your liver and cause liver failure and possibly death.   Follow-up instructions: Please follow-up with your primary care provider as needed for further evaluation of your symptoms if not improved.   Return instructions:   Please return to the Emergency Department if you experience worsening symptoms.   Return with worsening shortness of breath or trouble breathing.   Please return if you have any other emergent concerns.  Additional Information:  Your vital signs today were: BP (!) 156/92 (BP Location: Right Arm)   Pulse 80   Temp (!) 101.7 F (38.7 C) (Oral)   Resp (!) 23   Ht 5\' 5"  (1.651 m)   Wt 68 kg   SpO2 95%   BMI 24.96 kg/m  If your blood pressure (BP) was elevated above 135/85 this visit, please have this repeated by your doctor within one month. --------------

## 2020-07-18 NOTE — Progress Notes (Signed)
Patient ambulated from triage to Room #9 while on pulse ox.  Upon arriving to Room 9 patient's SPO2 was 93% and her HR was 102.  Patient states that she is SOB all the time and was worse when she waled from triage to room #9.
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
## 2020-07-18 NOTE — ED Provider Notes (Signed)
[facility] EMERGENCY DEPARTMENT Provider Note   CSN: [id number] Arrival date & time: 07/18/20  1754     History Chief Complaint  Patient presents with  . Fever    Melinda [name] is a 64 y.o. female.  Patient with no significant past medical history presents the emergency department for fever, chills, body aches, persistent cough, nausea, vomiting, and diarrhea ongoing over the past several days.  Today is day 6 of illness.  Patient was exposed to a friend who was positive for Covid.  She has a positive test here tonight.  She states that she has been having difficulty keeping down foods.  She is not significantly short of breath but has difficulty control coughing.  Symptoms not improved at home with OTC meds.  Patient denies any history of cardiac or lung disease but is a smoker.  Patient reports chest pain with coughing.  No abdominal pain.  She has not received Covid vaccine.  The onset of this condition was acute. The course is constant. Aggravating factors: none. Alleviating factors: none.          Past Medical History:  Diagnosis Date  . Reflux   . Sprain of neck 11/24/2013    Patient Active Problem List   Diagnosis Date Noted  . Routine adult health maintenance 08/28/2018  . Encounter for postoperative care 11/27/2017  . Closed fracture of scaphoid bone of wrist 11/16/2017  . Anxiety 08/13/2017  . Cervical spondylosis with radiculopathy 08/13/2017  . Depression 08/13/2017  . H/O gastroesophageal reflux (GERD) 08/13/2017  . Hyperlipidemia LDL goal <130 06/29/2017  . Chronic pain syndrome 06/28/2017  . History of alcohol abuse 06/28/2017  . Risk for falls 05/11/2016  . S/P cervical spinal fusion 02/10/2016  . Right foot injury 02/11/2015  . Right foot pain 02/11/2015  . Sprain of neck 11/24/2013  . Tobacco use disorder 03/30/2011    Past Surgical History:  Procedure Laterality Date  . ABDOMINAL HYSTERECTOMY    . BREAST BIOPSY Right 02/03/2013    stereotactic biopsy negative  . BREAST BIOPSY Left 2006   negative  . BREAST BIOPSY Left 07/14/2009   negative with clip  . BREAST CYST ASPIRATION Left 2006   negative  . 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  . CERVICAL FUSION    . CESAREAN SECTION    . ESOPHAGEAL DILATION       OB History    Gravida  4   Para  4   Term      Preterm      AB      Living        SAB      TAB      Ectopic      Multiple      Live Births              Family History  Problem Relation Age of Onset  . Diabetes Father   . Breast cancer Maternal Aunt   . Breast cancer Maternal Grandmother   . Breast cancer Cousin   . Breast cancer Cousin     Social History   Tobacco Use  . Smoking status: Former Smoker    Types: Cigarettes  . Smokeless tobacco: Never Used  Substance Use Topics  . Alcohol use: No    Alcohol/week: 0.0 standard drinks  . Drug use: No    Home Medications Prior to Admission medications   Medication Sig Start Date End Date Taking? Authorizing Provider  methylPREDNISolone (MEDROL DOSEPAK)  4 MG TBPK tablet 6 day dose pack - take as directed 06/11/20   Edwin Cap, DPM    Allergies    Aspirin, Succinylcholine, Tramadol, and Tizanidine  Review of Systems   Review of Systems  Constitutional: Positive for chills, fatigue and fever.  HENT: Positive for sore throat. Negative for rhinorrhea.   Eyes: Negative for redness.  Respiratory: Positive for cough. Negative for shortness of breath.   Cardiovascular: Positive for chest pain.  Gastrointestinal: Positive for diarrhea, nausea and vomiting. Negative for abdominal pain.  Genitourinary: Negative for dysuria, frequency, hematuria and urgency.  Musculoskeletal: Positive for myalgias.  Skin: Negative for rash.  Neurological: Positive for headaches.    Physical Exam Updated Vital Signs BP (!) 156/92 (BP Location: Right Arm)   Pulse 80   Temp (!) 101.7 F (38.7 C) (Oral)   Resp (!) 23   Ht 5\' 5"  (1.651 m)   Wt  68 kg   SpO2 95%   BMI 24.96 kg/m   Physical Exam Vitals and nursing note reviewed.  Constitutional:      General: She is not in acute distress.    Appearance: She is well-developed.  HENT:     Head: Normocephalic and atraumatic.     Right Ear: External ear normal.     Left Ear: External ear normal.     Nose: Nose normal.  Eyes:     Conjunctiva/sclera: Conjunctivae normal.  Cardiovascular:     Rate and Rhythm: Normal rate and regular rhythm.     Heart sounds: No murmur heard.   Pulmonary:     Effort: No respiratory distress.     Breath sounds: Rales present. No wheezing or rhonchi.     Comments: Mild scattered rales, frequent coughing during exam Abdominal:     Palpations: Abdomen is soft.     Tenderness: There is no abdominal tenderness. There is no guarding or rebound.  Musculoskeletal:     Cervical back: Normal range of motion and neck supple.     Right lower leg: No edema.     Left lower leg: No edema.  Skin:    General: Skin is warm and dry.     Findings: No rash.  Neurological:     General: No focal deficit present.     Mental Status: She is alert. Mental status is at baseline.     Motor: No weakness.  Psychiatric:        Mood and Affect: Mood normal.     ED Results / Procedures / Treatments   Labs (all labs ordered are listed, but only abnormal results are displayed) Labs Reviewed  SARS CORONAVIRUS 2 BY RT PCR (HOSPITAL ORDER, PERFORMED IN [facility]) - Abnormal; Notable for the following components:      Result Value   SARS Coronavirus 2 POSITIVE (*)    All other components within normal limits  BASIC METABOLIC PANEL - Abnormal; Notable for the following components:   Glucose, Bld 122 (*)    Calcium 8.6 (*)    All other components within normal limits  CBC - Abnormal; Notable for the following components:   Hemoglobin 15.5 (*)    HCT 47.2 (*)    All other components within normal limits  TROPONIN I (HIGH SENSITIVITY)    ED ECG  REPORT   Date: 07/18/2020  Rate: 87  Rhythm: normal sinus rhythm  QRS Axis: normal  Intervals: normal  ST/T Wave abnormalities: normal  Conduction Disutrbances:none  Narrative Interpretation:  Old EKG Reviewed: unchanged from 2017  I have personally reviewed the EKG tracing and agree with the computerized printout as noted.  Radiology No results found.  Procedures Procedures (including critical care time)  Medications Ordered in ED Medications  acetaminophen (TYLENOL) tablet 650 mg (650 mg Oral Given 07/18/20 2043)  ondansetron (ZOFRAN-ODT) disintegrating tablet 4 mg (4 mg Oral Given 07/18/20 2043)  benzonatate (TESSALON) capsule 100 mg (100 mg Oral Given 07/18/20 2043)    ED Course  I have reviewed the triage vital signs and the nursing notes.  Pertinent labs & imaging results that were available during my care of the patient were reviewed by me and considered in my medical decision making (see chart for details).  Patient seen and examined.  Covid positive.  Suspect mild pneumonia.  No hypoxia.  We will treat symptoms and reassess.  Vital signs reviewed and are as follows: BP (!) 156/92 (BP Location: Right Arm)   Pulse 80   Temp (!) 101.7 F (38.7 C) (Oral)   Resp (!) 23   Ht 5\' 5"  (1.651 m)   Wt 68 kg   SpO2 95%   BMI 24.96 kg/m   9:44 PM patient rechecked, doing well, tolerating fluids.  Will discharge to home.  Patient counseled on need to return with worsening shortness of breath, trouble breathing, persistent vomiting.  Patient encouraged to obtain a pulse oximeter if able, and return if she has persistent readings below 90%.  Patient verbalizes understanding agrees with plan.  Melinda [name] was evaluated in Emergency Department on 07/18/2020 for the symptoms described in the history of present illness. She was evaluated in the context of the global COVID-19 pandemic, which necessitated consideration that the patient might be at risk for infection with the  SARS-CoV-2 virus that causes COVID-19. Institutional protocols and algorithms that pertain to the evaluation of patients at risk for COVID-19 are in a state of rapid change based on information released by regulatory bodies including the CDC and federal and state organizations. These policies and algorithms were followed during the patient's care in the ED.     MDM Rules/Calculators/A&P                          Patient with positive COVID-19 test, suspect mild pneumonia.  Patient is not hypoxic.  Symptoms controlled in ED.  We will discharge to home with medication for symptoms.   Final Clinical Impression(s) / ED Diagnoses Final diagnoses:  Pneumonia due to COVID-19 virus    Rx / DC Orders ED Discharge Orders         Ordered    ondansetron (ZOFRAN ODT) 4 MG disintegrating tablet  Every 8 hours PRN        07/18/20 2150    benzonatate (TESSALON) 100 MG capsule  Every 8 hours        07/18/20 2150           2151, PA-C 07/18/20 2152    2153, MD 07/19/20 1614

## 2020-07-19 ENCOUNTER — Other Ambulatory Visit: Payer: Self-pay | Admitting: Oncology

## 2020-07-19 ENCOUNTER — Telehealth: Payer: Self-pay | Admitting: Oncology

## 2020-07-19 ENCOUNTER — Ambulatory Visit (HOSPITAL_COMMUNITY)
Admission: RE | Admit: 2020-07-19 | Discharge: 2020-07-19 | Disposition: A | Discharge status: Home / Self Care | Payer: Medicare Other | Source: Ambulatory Visit | Attending: Pulmonary Disease | Admitting: Pulmonary Disease

## 2020-07-19 DIAGNOSIS — U071 COVID-19: Secondary | ICD-10-CM

## 2020-07-19 DIAGNOSIS — Z23 Encounter for immunization: Secondary | ICD-10-CM | POA: Insufficient documentation

## 2020-07-19 MED ORDER — ALBUTEROL SULFATE HFA 108 (90 BASE) MCG/ACT IN AERS: 2.0000 | INHALATION_SPRAY | Freq: Once | RESPIRATORY_TRACT | Status: DC | PRN

## 2020-07-19 MED ORDER — METHYLPREDNISOLONE SODIUM SUCC 125 MG IJ SOLR: 125.0000 mg | Freq: Once | INTRAMUSCULAR | Status: DC | PRN

## 2020-07-19 MED ORDER — SODIUM CHLORIDE 0.9 % IV SOLN
1200.0000 mg | Freq: Once | INTRAVENOUS | Status: AC
  Administered 2020-07-19: 1200 mg via INTRAVENOUS

## 2020-07-19 MED ORDER — DIPHENHYDRAMINE HCL 50 MG/ML IJ SOLN: 50.0000 mg | Freq: Once | INTRAMUSCULAR | Status: DC | PRN

## 2020-07-19 MED ORDER — SODIUM CHLORIDE 0.9 % IV SOLN: INTRAVENOUS | Status: DC | PRN

## 2020-07-19 MED ORDER — FAMOTIDINE IN NACL 20-0.9 MG/50ML-% IV SOLN: 20.0000 mg | Freq: Once | INTRAVENOUS | Status: DC | PRN

## 2020-07-19 MED ORDER — EPINEPHRINE 0.3 MG/0.3ML IJ SOAJ: 0.3000 mg | Freq: Once | INTRAMUSCULAR | Status: DC | PRN

## 2020-07-19 NOTE — Telephone Encounter (Signed)
I connected by phone with  Melinda [name] to discuss the potential use of an new treatment for mild to moderate COVID-19 viral infection in non-hospitalized patients.   This patient is a age/sex that meets the FDA criteria for Emergency Use Authorization of casirivimab\imdevimab.  Has a (+) direct SARS-CoV-2 viral test result 1. Has mild or moderate COVID-19  2. Is ? 63 years of age and weighs ? 40 kg 3. Is NOT hospitalized due to COVID-19 4. Is NOT requiring oxygen therapy or requiring an increase in baseline oxygen flow rate due to COVID-19 5. Is within 10 days of symptom onset 6. Has at least one of the high risk factor(s) for progression to severe COVID-19 and/or hospitalization as defined in EUA. Specific high risk criteria :  Past Medical History:  Diagnosis Date   Reflux    Sprain of neck 11/24/2013  ?   Symptom onset  07/13/2020   I have spoken and communicated the following to the patient or parent/caregiver:   1. FDA has authorized the emergency use of casirivimab\imdevimab for the treatment of mild to moderate COVID-19 in adults and pediatric patients with positive results of direct SARS-CoV-2 viral testing who are 30 years of age and older weighing at least 40 kg, and who are at high risk for progressing to severe COVID-19 and/or hospitalization.   2. The significant known and potential risks and benefits of casirivimab\imdevimab, and the extent to which such potential risks and benefits are unknown.   3. Information on available alternative treatments and the risks and benefits of those alternatives, including clinical trials.   4. Patients treated with casirivimab\imdevimab should continue to self-isolate and use infection control measures (e.g., wear mask, isolate, social distance, avoid sharing personal items, clean and disinfect high touch surfaces, and frequent handwashing) according to CDC guidelines.    5. The patient or parent/caregiver has the option to accept or  refuse casirivimab\imdevimab .   After reviewing this information with the patient, The patient agreed to proceed with receiving casirivimab\imdevimab infusion and will be provided a copy of the Fact sheet prior to receiving the infusion.Mignon Pine, AGNP-C [phone] (Infusion Center Hotline)

## 2020-07-19 NOTE — Discharge Instructions (Signed)

## 2020-07-19 NOTE — Progress Notes (Signed)
  Diagnosis: COVID-19  Physician: Dr. Wright  Procedure: [facility] Med: casirivimab\imdevimab infusion - Provided patient with casirivimab\imdevimab fact sheet for patients, parents and caregivers prior to infusion.  Complications: No immediate complications noted.  Discharge: Discharged home   Melinda [name] M Melinda [name] 07/19/2020  

## 2020-08-31 ENCOUNTER — Other Ambulatory Visit: Payer: Self-pay | Admitting: Family Medicine

## 2020-08-31 DIAGNOSIS — Z1231 Encounter for screening mammogram for malignant neoplasm of breast: Secondary | ICD-10-CM

## 2020-11-04 ENCOUNTER — Other Ambulatory Visit: Payer: Self-pay

## 2020-11-04 ENCOUNTER — Ambulatory Visit
Admission: RE | Admit: 2020-11-04 | Discharge: 2020-11-04 | Disposition: A | Discharge status: Home / Self Care | Payer: Medicare Other | Source: Ambulatory Visit | Attending: Family Medicine | Admitting: Family Medicine

## 2020-11-04 DIAGNOSIS — Z1231 Encounter for screening mammogram for malignant neoplasm of breast: Secondary | ICD-10-CM | POA: Diagnosis present

## 2021-01-17 ENCOUNTER — Other Ambulatory Visit: Payer: Self-pay

## 2021-01-17 ENCOUNTER — Encounter (HOSPITAL_COMMUNITY): Payer: Self-pay

## 2021-01-17 ENCOUNTER — Emergency Department (HOSPITAL_COMMUNITY)
Admission: EM | Admit: 2021-01-17 | Discharge: 2021-01-17 | Disposition: A | Discharge status: Home / Self Care | Payer: Medicare Other | Attending: Emergency Medicine | Admitting: Emergency Medicine

## 2021-01-17 DIAGNOSIS — R630 Anorexia: Secondary | ICD-10-CM | POA: Insufficient documentation

## 2021-01-17 DIAGNOSIS — R1012 Left upper quadrant pain: Secondary | ICD-10-CM | POA: Diagnosis not present

## 2021-01-17 DIAGNOSIS — Z5321 Procedure and treatment not carried out due to patient leaving prior to being seen by health care provider: Secondary | ICD-10-CM | POA: Insufficient documentation

## 2021-01-17 DIAGNOSIS — R140 Abdominal distension (gaseous): Secondary | ICD-10-CM | POA: Insufficient documentation

## 2021-01-17 LAB — COMPREHENSIVE METABOLIC PANEL
ALT: 24 U/L (ref 0–44)
AST: 24 U/L (ref 15–41)
Albumin: 4.1 g/dL (ref 3.5–5.0)
Alkaline Phosphatase: 100 U/L (ref 38–126)
Anion gap: 10 (ref 5–15)
BUN: 9 mg/dL (ref 8–23)
CO2: 27 mmol/L (ref 22–32)
Calcium: 9.5 mg/dL (ref 8.9–10.3)
Chloride: 101 mmol/L (ref 98–111)
Creatinine, Ser: 0.85 mg/dL (ref 0.44–1.00)
GFR, Estimated: >60 mL/min (ref >60)
Glucose, Bld: 107 mg/dL — ABNORMAL HIGH (ref 70–99)
Potassium: 4 mmol/L (ref 3.5–5.1)
Sodium: 138 mmol/L (ref 135–145)
Total Bilirubin: 0.7 mg/dL (ref 0.3–1.2)
Total Protein: 7.7 g/dL (ref 6.5–8.1)

## 2021-01-17 LAB — CBC
HCT: 46.3 % — ABNORMAL HIGH (ref 36.0–46.0)
Hemoglobin: 14.9 g/dL (ref 12.0–15.0)
MCH: 30.6 pg (ref 26.0–34.0)
MCHC: 32.2 g/dL (ref 30.0–36.0)
MCV: 95.1 fL (ref 80.0–100.0)
Platelets: 284 10*3/uL (ref 150–400)
RBC: 4.87 MIL/uL (ref 3.87–5.11)
RDW: 13.6 % (ref 11.5–15.5)
WBC: 7.4 10*3/uL (ref 4.0–10.5)
nRBC: 0 % (ref 0.0–0.2)

## 2021-01-17 LAB — LIPASE, BLOOD: Lipase: 28 U/L (ref 11–51)

## 2021-01-17 NOTE — ED Notes (Signed)
Pt stated she has been waiting since 2pm and is in pain and can't wait any longer. Pt decided to leave and try again tomorrow.

## 2021-01-17 NOTE — ED Triage Notes (Signed)
Pt reports LUQ and mid abdominal pain and mild distention that began last night. Pt reports decreased appetite.

## 2021-01-18 ENCOUNTER — Emergency Department (HOSPITAL_COMMUNITY): Payer: Medicare Other

## 2021-01-18 ENCOUNTER — Emergency Department (HOSPITAL_COMMUNITY)
Admission: EM | Admit: 2021-01-18 | Discharge: 2021-01-18 | Disposition: A | Discharge status: Home / Self Care | Payer: Medicare Other | Attending: Emergency Medicine | Admitting: Emergency Medicine

## 2021-01-18 ENCOUNTER — Encounter (HOSPITAL_COMMUNITY): Payer: Self-pay | Admitting: Pharmacy Technician

## 2021-01-18 DIAGNOSIS — R1013 Epigastric pain: Secondary | ICD-10-CM

## 2021-01-18 DIAGNOSIS — Z87891 Personal history of nicotine dependence: Secondary | ICD-10-CM | POA: Diagnosis not present

## 2021-01-18 DIAGNOSIS — R1011 Right upper quadrant pain: Secondary | ICD-10-CM

## 2021-01-18 DIAGNOSIS — R1110 Vomiting, unspecified: Secondary | ICD-10-CM | POA: Diagnosis not present

## 2021-01-18 DIAGNOSIS — R1031 Right lower quadrant pain: Secondary | ICD-10-CM

## 2021-01-18 DIAGNOSIS — R1084 Generalized abdominal pain: Secondary | ICD-10-CM | POA: Diagnosis not present

## 2021-01-18 LAB — CBC WITH DIFFERENTIAL/PLATELET
Abs Immature Granulocytes: 0.02 10*3/uL (ref 0.00–0.07)
Basophils Absolute: 0.1 10*3/uL (ref 0.0–0.1)
Basophils Relative: 1 %
Eosinophils Absolute: 0.1 10*3/uL (ref 0.0–0.5)
Eosinophils Relative: 1 %
HCT: 42.7 % (ref 36.0–46.0)
Hemoglobin: 14 g/dL (ref 12.0–15.0)
Immature Granulocytes: 0 %
Lymphocytes Relative: 28 %
Lymphs Abs: 2.3 10*3/uL (ref 0.7–4.0)
MCH: 31 pg (ref 26.0–34.0)
MCHC: 32.8 g/dL (ref 30.0–36.0)
MCV: 94.7 fL (ref 80.0–100.0)
Monocytes Absolute: 0.6 10*3/uL (ref 0.1–1.0)
Monocytes Relative: 7 %
Neutro Abs: 5.2 10*3/uL (ref 1.7–7.7)
Neutrophils Relative %: 63 %
Platelets: 273 10*3/uL (ref 150–400)
RBC: 4.51 MIL/uL (ref 3.87–5.11)
RDW: 13.5 % (ref 11.5–15.5)
WBC: 8.2 10*3/uL (ref 4.0–10.5)
nRBC: 0 % (ref 0.0–0.2)

## 2021-01-18 LAB — COMPREHENSIVE METABOLIC PANEL
ALT: 20 U/L (ref 0–44)
AST: 20 U/L (ref 15–41)
Albumin: 3.4 g/dL — ABNORMAL LOW (ref 3.5–5.0)
Alkaline Phosphatase: 77 U/L (ref 38–126)
Anion gap: 6 (ref 5–15)
BUN: 6 mg/dL — ABNORMAL LOW (ref 8–23)
CO2: 28 mmol/L (ref 22–32)
Calcium: 9 mg/dL (ref 8.9–10.3)
Chloride: 104 mmol/L (ref 98–111)
Creatinine, Ser: 0.8 mg/dL (ref 0.44–1.00)
GFR, Estimated: >60 mL/min (ref >60)
Glucose, Bld: 98 mg/dL (ref 70–99)
Potassium: 3.9 mmol/L (ref 3.5–5.1)
Sodium: 138 mmol/L (ref 135–145)
Total Bilirubin: 0.7 mg/dL (ref 0.3–1.2)
Total Protein: 6.2 g/dL — ABNORMAL LOW (ref 6.5–8.1)

## 2021-01-18 LAB — URINALYSIS, ROUTINE W REFLEX MICROSCOPIC
Bilirubin Urine: NEGATIVE
Glucose, UA: NEGATIVE mg/dL
Hgb urine dipstick: NEGATIVE
Ketones, ur: NEGATIVE mg/dL
Leukocytes,Ua: NEGATIVE
Nitrite: NEGATIVE
Protein, ur: NEGATIVE mg/dL
Specific Gravity, Urine: 1.029 (ref 1.005–1.030)
pH: 7 (ref 5.0–8.0)

## 2021-01-18 LAB — LIPASE, BLOOD: Lipase: 25 U/L (ref 11–51)

## 2021-01-18 MED ORDER — SODIUM CHLORIDE 0.9 % IV SOLN: Freq: Once | INTRAVENOUS | Status: AC

## 2021-01-18 MED ORDER — ONDANSETRON HCL 4 MG/2ML IJ SOLN
4.0000 mg | Freq: Once | INTRAMUSCULAR | Status: AC
  Administered 2021-01-18: 4 mg via INTRAVENOUS
  Filled 2021-01-18: qty 2

## 2021-01-18 MED ORDER — ONDANSETRON 4 MG PO TBDP: 4.0000 mg | ORAL_TABLET | Freq: Three times a day (TID) | ORAL | 0 refills | Status: DC | PRN

## 2021-01-18 MED ORDER — LIDOCAINE VISCOUS HCL 2 % MT SOLN
15.0000 mL | Freq: Once | OROMUCOSAL | Status: AC
  Administered 2021-01-18: 15 mL via ORAL
  Filled 2021-01-18: qty 15

## 2021-01-18 MED ORDER — FENTANYL CITRATE (PF) 100 MCG/2ML IJ SOLN: 50.0000 ug | Freq: Once | INTRAMUSCULAR | Status: DC | PRN

## 2021-01-18 MED ORDER — ONDANSETRON HCL 4 MG/2ML IJ SOLN: 4.0000 mg | Freq: Once | INTRAMUSCULAR | Status: DC

## 2021-01-18 MED ORDER — DICYCLOMINE HCL 20 MG PO TABS: 20.0000 mg | ORAL_TABLET | Freq: Two times a day (BID) | ORAL | 0 refills | Status: DC

## 2021-01-18 MED ORDER — FENTANYL CITRATE (PF) 100 MCG/2ML IJ SOLN
50.0000 ug | INTRAMUSCULAR | Status: DC | PRN
Start: 2021-01-18 — End: 2021-01-18

## 2021-01-18 MED ORDER — OMEPRAZOLE 20 MG PO CPDR: 20.0000 mg | DELAYED_RELEASE_CAPSULE | Freq: Every day | ORAL | 0 refills | Status: AC

## 2021-01-18 MED ORDER — ALUM & MAG HYDROXIDE-SIMETH 200-200-20 MG/5ML PO SUSP
30.0000 mL | Freq: Once | ORAL | Status: AC
  Administered 2021-01-18: 30 mL via ORAL
  Filled 2021-01-18: qty 30

## 2021-01-18 MED ORDER — DICYCLOMINE HCL 10 MG PO CAPS
20.0000 mg | ORAL_CAPSULE | Freq: Once | ORAL | Status: AC
  Administered 2021-01-18: 20 mg via ORAL
  Filled 2021-01-18: qty 2

## 2021-01-18 MED ORDER — FENTANYL CITRATE (PF) 100 MCG/2ML IJ SOLN
50.0000 ug | Freq: Once | INTRAMUSCULAR | Status: AC
  Administered 2021-01-18: 50 ug via INTRAVENOUS
  Filled 2021-01-18: qty 2

## 2021-01-18 MED ORDER — IOHEXOL 300 MG/ML  SOLN
100.0000 mL | Freq: Once | INTRAMUSCULAR | Status: AC | PRN
  Administered 2021-01-18: 100 mL via INTRAVENOUS

## 2021-01-18 NOTE — ED Notes (Signed)
Patient Alert and oriented to baseline. Stable and ambulatory to baseline. Patient verbalized understanding of the discharge instructions.  Patient belongings were taken by the patient.   

## 2021-01-18 NOTE — ED Provider Notes (Signed)
[facility]   CSN: [id number] Arrival date & time: 01/18/21  1321     History Chief Complaint  Patient presents with  . Abdominal Pain    Melinda [name] is a 65 y.o. female.  HPI Patient is a 65 year old female with a past medical history significant for anxiety, chronic pain syndrome, depression, alcohol abuse, HLD, esophageal reflux  She states that over the course of yesterday she states that she has experienced gradually worsening abdominal pain she describes it as bilateral upper and right lower quadrant abdominal pain.  She states it is achy, constant, waxing and waning.  Does not seem to have any specific aggravating or mitigating factors including eating, walking, breathing, coughing.  She does have some nausea but states that she has had fewer bowel movements over the past day but attributes this to eating less.  She states she is still passing gas.  She denies any urinary frequency, urgency or dysuria and no hematuria.  She states she has a history of reflux but this feels nothing like that.  She denies any chest pain or shortness of breath no lightheadedness or dizziness.  No hemoptysis or hematemesis.  No vomiting.     Past Medical History:  Diagnosis Date  . Reflux   . Sprain of neck 11/24/2013    Patient Active Problem List   Diagnosis Date Noted  . Routine adult health maintenance 08/28/2018  . Encounter for postoperative care 11/27/2017  . Closed fracture of scaphoid bone of wrist 11/16/2017  . Anxiety 08/13/2017  . Cervical spondylosis with radiculopathy 08/13/2017  . Depression 08/13/2017  . H/O gastroesophageal reflux (GERD) 08/13/2017  . Hyperlipidemia LDL goal <130 06/29/2017  . Chronic pain syndrome 06/28/2017  . History of alcohol abuse 06/28/2017  . Risk for falls 05/11/2016  . S/P cervical spinal fusion 02/10/2016  . Right foot injury 02/11/2015  . Right foot pain 02/11/2015  . Sprain of  neck 11/24/2013  . Tobacco use disorder 03/30/2011    Past Surgical History:  Procedure Laterality Date  . ABDOMINAL HYSTERECTOMY    . BREAST BIOPSY Right 02/03/2013   stereotactic biopsy negative  . BREAST BIOPSY Left 2006   negative  . BREAST BIOPSY Left 07/14/2009   negative with clip  . BREAST CYST ASPIRATION Left 2006   negative  . 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  . CERVICAL FUSION    . CESAREAN SECTION    . ESOPHAGEAL DILATION       OB History    Gravida  4   Para  4   Term      Preterm      AB      Living        SAB      IAB      Ectopic      Multiple      Live Births              Family History  Problem Relation Age of Onset  . Diabetes Father   . Breast cancer Maternal Aunt   . Breast cancer Maternal Grandmother   . Breast cancer Cousin   . Breast cancer Cousin     Social History   Tobacco Use  . Smoking status: Former Smoker    Types: Cigarettes  . Smokeless tobacco: Never Used  Substance Use Topics  . Alcohol use: No    Alcohol/week: 0.0 standard drinks  . Drug use: No  Home Medications Prior to Admission medications   Medication Sig Start Date End Date Taking? Authorizing Provider  dicyclomine (BENTYL) 20 MG tablet Take 1 tablet (20 mg total) by mouth 2 (two) times daily. 01/18/21  Yes Beckam Abdulaziz S, PA  omeprazole (PRILOSEC) 20 MG capsule Take 1 capsule (20 mg total) by mouth daily. 01/18/21  Yes Bandy Honaker S, PA  ondansetron (ZOFRAN ODT) 4 MG disintegrating tablet Take 1 tablet (4 mg total) by mouth every 8 (eight) hours as needed for nausea or vomiting. 01/18/21  Yes Frenchie Pribyl S, PA  benzonatate (TESSALON) 100 MG capsule Take 1 capsule (100 mg total) by mouth every 8 (eight) hours. 07/18/20   Renne Crigler, PA-C  methylPREDNISolone (MEDROL DOSEPAK) 4 MG TBPK tablet 6 day dose pack - take as directed 06/11/20   Edwin Cap, DPM    Allergies    Aspirin, Succinylcholine, Tramadol, and Tizanidine  Review of  Systems   Review of Systems  Constitutional: Positive for fatigue. Negative for chills and fever.  HENT: Negative for congestion.   Eyes: Negative for pain.  Respiratory: Negative for cough and shortness of breath.   Cardiovascular: Negative for chest pain and leg swelling.  Gastrointestinal: Positive for abdominal pain and nausea. Negative for abdominal distention, diarrhea and vomiting.  Genitourinary: Negative for dysuria.  Musculoskeletal: Negative for myalgias.  Skin: Negative for rash.  Neurological: Negative for dizziness and headaches.    Physical Exam Updated Vital Signs BP 134/73 (BP Location: Left Arm)   Pulse 64   Temp 98.3 F (36.8 C) (Oral)   Resp 18   SpO2 98%   Physical Exam Vitals and nursing note reviewed.  Constitutional:      Comments: Uncomfortable 65 year old female Able to answer Qs and follow commands Speaking in full sentences  HENT:     Head: Normocephalic and atraumatic.     Nose: Nose normal.     Mouth/Throat:     Mouth: Mucous membranes are dry.  Eyes:     General: No scleral icterus. Cardiovascular:     Rate and Rhythm: Normal rate and regular rhythm.     Pulses: Normal pulses.     Heart sounds: Normal heart sounds.  Pulmonary:     Effort: Pulmonary effort is normal. No respiratory distress.     Breath sounds: No wheezing.  Abdominal:     Palpations: Abdomen is soft.     Tenderness: There is abdominal tenderness in the right upper quadrant, epigastric area, periumbilical area and left upper quadrant. There is no right CVA tenderness, left CVA tenderness, guarding or rebound.  Musculoskeletal:     Cervical back: Normal range of motion.     Right lower leg: No edema.     Left lower leg: No edema.  Skin:    General: Skin is warm and dry.     Capillary Refill: Capillary refill takes less than 2 seconds.  Neurological:     Mental Status: She is alert. Mental status is at baseline.  Psychiatric:        Mood and Affect: Mood normal.         Behavior: Behavior normal.     ED Results / Procedures / Treatments   Labs (all labs ordered are listed, but only abnormal results are displayed) Labs Reviewed  COMPREHENSIVE METABOLIC PANEL - Abnormal; Notable for the following components:      Result Value   BUN 6 (*)    Total Protein 6.2 (*)  Albumin 3.4 (*)    All other components within normal limits  URINE CULTURE  CBC WITH DIFFERENTIAL/PLATELET  LIPASE, BLOOD  URINALYSIS, ROUTINE W REFLEX MICROSCOPIC    EKG None  Radiology No results found.  Procedures Procedures   Medications Ordered in ED Medications  0.9 %  sodium chloride infusion (has no administration in time range)  ondansetron (ZOFRAN) injection 4 mg (4 mg Intravenous Not Given 01/18/21 1346)  dicyclomine (BENTYL) capsule 20 mg (20 mg Oral Given 01/18/21 1356)  alum & mag hydroxide-simeth (MAALOX/MYLANTA) 200-200-20 MG/5ML suspension 30 mL (30 mLs Oral Given 01/18/21 1357)    And  lidocaine (XYLOCAINE) 2 % viscous mouth solution 15 mL (15 mLs Oral Given 01/18/21 1357)    ED Course  I have reviewed the triage vital signs and the nursing notes.  Pertinent labs & imaging results that were available during my care of the patient were reviewed by me and considered in my medical decision making (see chart for details).    MDM Rules/Calculators/A&P                          Patient is 65 year old female with past medical history detailed in HPI presented today for seemingly disparate areas of abdominal pain.  She is complaining of right upper quadrant, left upper quadrant and right lower quadrant abdominal pain.  My examination patient does seem to have tenderness in the right upper quadrant left upper quadrant and periumbilical region does not seem to have any right lower quadrant focal tenderness.  There is no guarding or rebound on my abdominal examination.  Patient provided with GI cocktail which did not seem to improve her symptoms. On my reexamination  patient continues to complain of severe pain and with palpation elicits worsening pain.  Will obtain CT imaging of abdomen pelvis to evaluate for appendicitis, cholecystitis, other intra-abdominal infection although I have low relatively low suspicion for this.   I personally reviewed all laboratory work and imaging.  Metabolic panel without any acute abnormality specifically kidney function within normal limits and no significant electrolyte abnormalities. CBC without leukocytosis or significant anemia. Lipase within normal limits.pancreatitis.  Urinalysis and urine culture pending at this time.  CT abdomen pelvis pending at this time.  Patient will also need Zofran and normal saline.  Patient care transferred to [facility] C PA-C at time of shift change at 3:30 PM. Patient is aware of plan at this time is to discharge with omeprazole, Bentyl, Zofran and follow-up with PCP should her CT scan be without any remarkable finding.  She understands she will not receive any further pain medication at this time.  On my reevaluation prior to shift change patient is sleeping quietly in bed.  She has urinated but did not provide [place] with a sample.  Final Clinical Impression(s) / ED Diagnoses Final diagnoses:  Epigastric pain  Right upper quadrant abdominal pain  Right lower quadrant abdominal pain    Rx / DC Orders ED Discharge Orders         Ordered    dicyclomine (BENTYL) 20 MG tablet  2 times daily        01/18/21 1521    omeprazole (PRILOSEC) 20 MG capsule  Daily        01/18/21 1521    ondansetron (ZOFRAN ODT) 4 MG disintegrating tablet  Every 8 hours PRN        01/18/21 1524  [place], [place] 01/18/21 1527    Wynetta Fines, MD 01/19/21 [phone]

## 2021-01-18 NOTE — Discharge Instructions (Addendum)
  Your workup today was reassuring. Your CT scan was without any abnormal findings except for some enlarged lymph nodes.  Your ultrasound of your gallbladder and liver did not show any abnormalities.  Given this reassuring work-up I suspect that your symptoms are related to either peptic ulcer disease/a stomach ulcer or gastritis.  Please take omeprazole as prescribed.  I have also prescribed you Bentyl.  Please drink plenty of water.  You may use the Zofran if you have any nausea.  Please follow up with your primary doctor within the next 5-7 days.  If you do not have a primary care provider, information for a healthcare clinic has been provided for you to make arrangements for follow up care. Please return to the ER sooner if you have any new or worsening symptoms, or if you have any of the following symptoms:  Abdominal pain that does not go away.  You have a fever.  You keep throwing up (vomiting).  The pain is felt only in portions of the abdomen. Pain in the right side could possibly be appendicitis. In an adult, pain in the left lower portion of the abdomen could be colitis or diverticulitis.  You pass bloody or black tarry stools.  There is bright red blood in the stool.  The constipation stays for more than 4 days.  There is belly (abdominal) or rectal pain.  You do not seem to be getting better.  You have any questions or concerns.

## 2021-01-18 NOTE — ED Provider Notes (Signed)
AT shift change, care assumed from [facility], [place]. See his note for full H&P.   Per his note, "Patient is a 65 year old female with a past medical history significant for anxiety, chronic pain syndrome, depression, alcohol abuse, HLD, esophageal reflux  She states that over the course of yesterday she states that she has experienced gradually worsening abdominal pain she describes it as bilateral upper and right lower quadrant abdominal pain.  She states it is achy, constant, waxing and waning.  Does not seem to have any specific aggravating or mitigating factors including eating, walking, breathing, coughing.  She does have some nausea but states that she has had fewer bowel movements over the past day but attributes this to eating less.  She states she is still passing gas.  She denies any urinary frequency, urgency or dysuria and no hematuria.  She states she has a history of reflux but this feels nothing like that.  She denies any chest pain or shortness of breath no lightheadedness or dizziness.  No hemoptysis or hematemesis.  No vomiting."   Physical Exam  BP (!) 141/71   Pulse 66   Temp 98 F (36.7 C) (Oral)   Resp 17   SpO2 99%   Physical Exam Vitals and nursing note reviewed.  Constitutional:      General: She is not in acute distress.    Appearance: She is well-developed.  HENT:     Head: Normocephalic and atraumatic.  Eyes:     Conjunctiva/sclera: Conjunctivae normal.  Cardiovascular:     Rate and Rhythm: Normal rate.  Pulmonary:     Effort: Pulmonary effort is normal.  Abdominal:     Tenderness: There is abdominal tenderness in the right upper quadrant and epigastric area. There is no guarding or rebound.  Musculoskeletal:        General: Normal range of motion.     Cervical back: Neck supple.  Skin:    General: Skin is warm and dry.  Neurological:     Mental Status: She is alert.      ED Course/Procedures     Procedures  Results for orders placed  or performed during the hospital encounter of 01/18/21  Comprehensive metabolic panel  Result Value Ref Range   Sodium 138 135 - 145 mmol/L   Potassium 3.9 3.5 - 5.1 mmol/L   Chloride 104 98 - 111 mmol/L   CO2 28 22 - 32 mmol/L   Glucose, Bld 98 70 - 99 mg/dL   BUN 6 (L) 8 - 23 mg/dL   Creatinine, Ser 7.84 0.44 - 1.00 mg/dL   Calcium 9.0 8.9 - 69.6 mg/dL   Total Protein 6.2 (L) 6.5 - 8.1 g/dL   Albumin 3.4 (L) 3.5 - 5.0 g/dL   AST 20 15 - 41 U/L   ALT 20 0 - 44 U/L   Alkaline Phosphatase 77 38 - 126 U/L   Total Bilirubin 0.7 0.3 - 1.2 mg/dL   GFR, Estimated >29 >52 mL/min   Anion gap 6 5 - 15  CBC with Differential/Platelet  Result Value Ref Range   WBC 8.2 4.0 - 10.5 K/uL   RBC 4.51 3.87 - 5.11 MIL/uL   Hemoglobin 14.0 12.0 - 15.0 g/dL   HCT 84.1 32.4 - 40.1 %   MCV 94.7 80.0 - 100.0 fL   MCH 31.0 26.0 - 34.0 pg   MCHC 32.8 30.0 - 36.0 g/dL   RDW 02.7 25.3 - 66.4 %   Platelets 273 150 - 400  K/uL   nRBC 0.0 0.0 - 0.2 %   Neutrophils Relative % 63 %   Neutro Abs 5.2 1.7 - 7.7 K/uL   Lymphocytes Relative 28 %   Lymphs Abs 2.3 0.7 - 4.0 K/uL   Monocytes Relative 7 %   Monocytes Absolute 0.6 0.1 - 1.0 K/uL   Eosinophils Relative 1 %   Eosinophils Absolute 0.1 0.0 - 0.5 K/uL   Basophils Relative 1 %   Basophils Absolute 0.1 0.0 - 0.1 K/uL   Immature Granulocytes 0 %   Abs Immature Granulocytes 0.02 0.00 - 0.07 K/uL  Lipase, blood  Result Value Ref Range   Lipase 25 11 - 51 U/L  Urinalysis, Routine w reflex microscopic  Result Value Ref Range   Color, Urine STRAW (A) YELLOW   APPearance CLEAR CLEAR   Specific Gravity, Urine 1.029 1.005 - 1.030   pH 7.0 5.0 - 8.0   Glucose, UA NEGATIVE NEGATIVE mg/dL   Hgb urine dipstick NEGATIVE NEGATIVE   Bilirubin Urine NEGATIVE NEGATIVE   Ketones, ur NEGATIVE NEGATIVE mg/dL   Protein, ur NEGATIVE NEGATIVE mg/dL   Nitrite NEGATIVE NEGATIVE   Leukocytes,Ua NEGATIVE NEGATIVE   CT ABDOMEN PELVIS W CONTRAST  Result Date:  01/18/2021 CLINICAL DATA:  Abdominal pain and nausea. EXAM: CT ABDOMEN AND PELVIS WITH CONTRAST TECHNIQUE: Multidetector CT imaging of the abdomen and pelvis was performed using the standard protocol following bolus administration of intravenous contrast. CONTRAST:  OMNIPAQUE IOHEXOL 300 MG/ML  SOLN COMPARISON:  Prior CT of the abdomen and pelvis on 08/20/2017 FINDINGS: Lower chest: Stable subpleural scarring in the posterior right lower lobe. Stable small hiatal hernia. Hepatobiliary: Respiratory motion causes blurring, particularly of the gallbladder and extrahepatic bile ducts. The liver appears grossly unremarkable. Pancreas: Unremarkable. No pancreatic ductal dilatation or surrounding inflammatory changes. Spleen: Normal in size without focal abnormality. Adrenals/Urinary Tract: Adrenal glands are unremarkable. Kidneys are normal, without renal calculi, focal lesion, or hydronephrosis. Bladder is unremarkable. Stomach/Bowel: Bowel shows no evidence of obstruction, ileus or lesion. The appendix is normal. No free air identified. Vascular/Lymphatic: Ill-defined soft tissue nodules in the mesenteric fat to the left of midline likely represent mildly enlarged lymph nodes measuring up to approximately 1.5 cm in short axis and 2 cm in length. These are nonspecific and do not appear to be associated with a discrete inflammatory process. Prominent lymph nodes were not present in this location on the 2018 study. Reproductive: Status post hysterectomy. No adnexal masses. Other: No abdominal wall hernia or abnormality. No abdominopelvic ascites. Musculoskeletal: No acute or significant osseous findings. IMPRESSION: 1. Stable small hiatal hernia. 2. Ill-defined soft tissue nodules in the mesenteric fat to the left of midline likely represent mildly enlarged mesenteric lymph nodes. These are nonspecific and do not appear to be associated with a discrete inflammatory process. 3. Respiratory motion artifact limits  evaluation of the gallbladder and extrahepatic bile ducts. Electronically Signed   By: Irish Lack M.D.   On: 01/18/2021 16:30   US Abdomen Limited RUQ (LIVER/GB)  Result Date: 01/18/2021 CLINICAL DATA:  Right upper quadrant discomfort. EXAM: ULTRASOUND ABDOMEN LIMITED RIGHT UPPER QUADRANT COMPARISON:  CT abdomen pelvis 01/18/2021 FINDINGS: Gallbladder: No gallstones or wall thickening visualized. No sonographic Murphy sign noted by sonographer. Common bile duct: Diameter: 2 mm. Liver: No focal lesion identified. Within normal limits in parenchymal echogenicity. Portal vein is patent on color Doppler imaging with normal direction of blood flow towards the liver. Other: None. IMPRESSION: No acute right upper quadrant abnormality.  Electronically Signed   By: Tish Frederickson M.D.   On: 01/18/2021 18:04     MDM   65 y/o F presenting for eval of abd pain.   Reviewed/interpreted labs  CBC, CMP and lipase are reassuring.   At shift change, pt pending UA and CT abd/pelvis.   UA - w/o evidence of UTI CT abd/pelvis -  1. Stable small hiatal hernia. 2. Ill-defined soft tissue nodules in the mesenteric fat to the left of midline likely represent mildly enlarged mesenteric lymph nodes. These are nonspecific and do not appear to be associated with a discrete inflammatory process. 3. Respiratory motion artifact limits evaluation of the gallbladder and extrahepatic bile ducts. RUQ [place] -  No acute right upper quadrant abnormality.  On reassessment, pt sleeping in bed comfortably. She states she feels some improvement after meds. She has not had any episodes of vomiting here in the ED. She has been able to tolerate PO without difficulty. Suspect gastritis vs pud. Will tx with prilosec and bentyl. zofran also given for nausea. Advised on pcp f/u and strict return precautions. She voices understanding of the plan and reasons to return. All questions answered, pt stable for discharge.        Rayne Du 01/18/21 1819    Milagros Loll, MD 01/19/21 2142

## 2021-01-18 NOTE — ED Triage Notes (Signed)
Pt bib ems from home with reports of severe abd pain bil upper and RLQ. Onset yesterday with worsening today. Pt seen yesterday for the same. Pt endorses nausea with ems, given 4mg  zofran en route.

## 2021-01-21 LAB — URINE CULTURE: Culture: >=100000 — AB

## 2021-08-15 IMAGING — MG DIGITAL SCREENING BILAT W/ TOMO W/ CAD
8 series · 8 of 24 positions shown · non-contrast
Comparison: Previous exam(s).

CLINICAL DATA: Screening.

EXAM:
DIGITAL SCREENING BILATERAL MAMMOGRAM WITH TOMO AND CAD

[L MLO synth-2D]
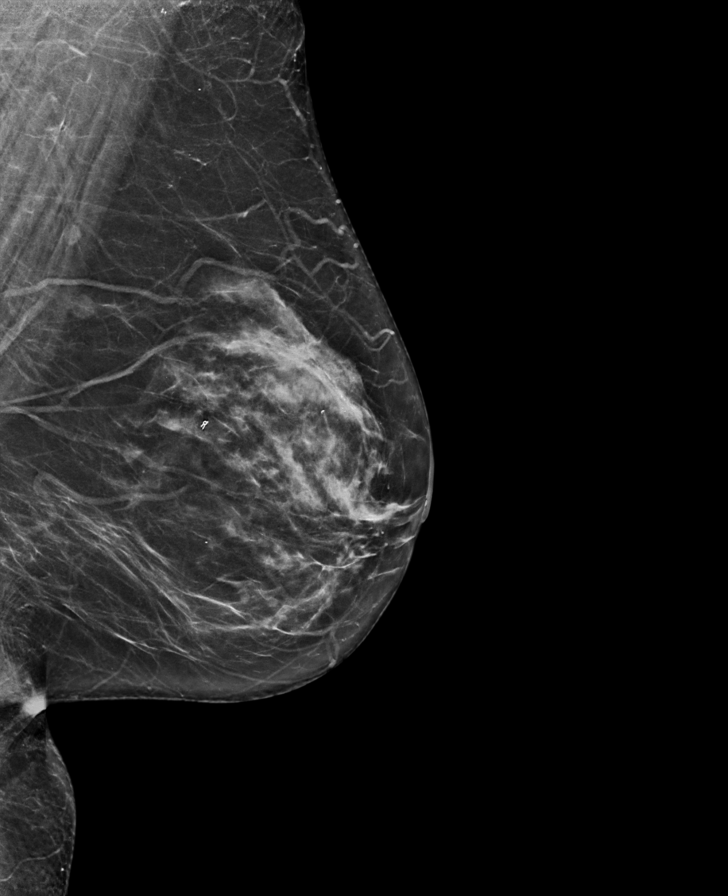

[R CC synth-2D]
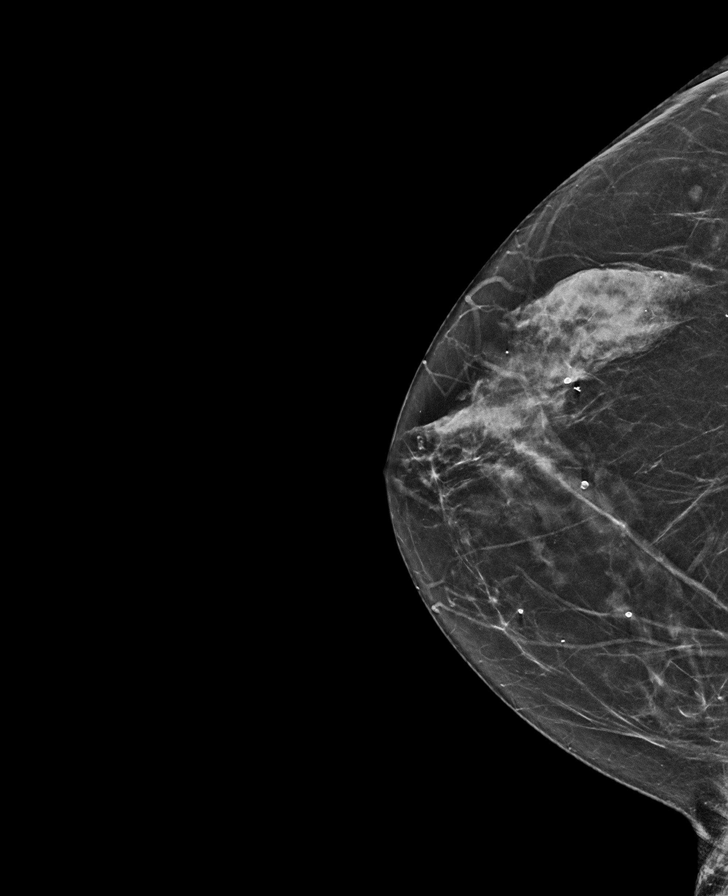

[L CC synth-2D]
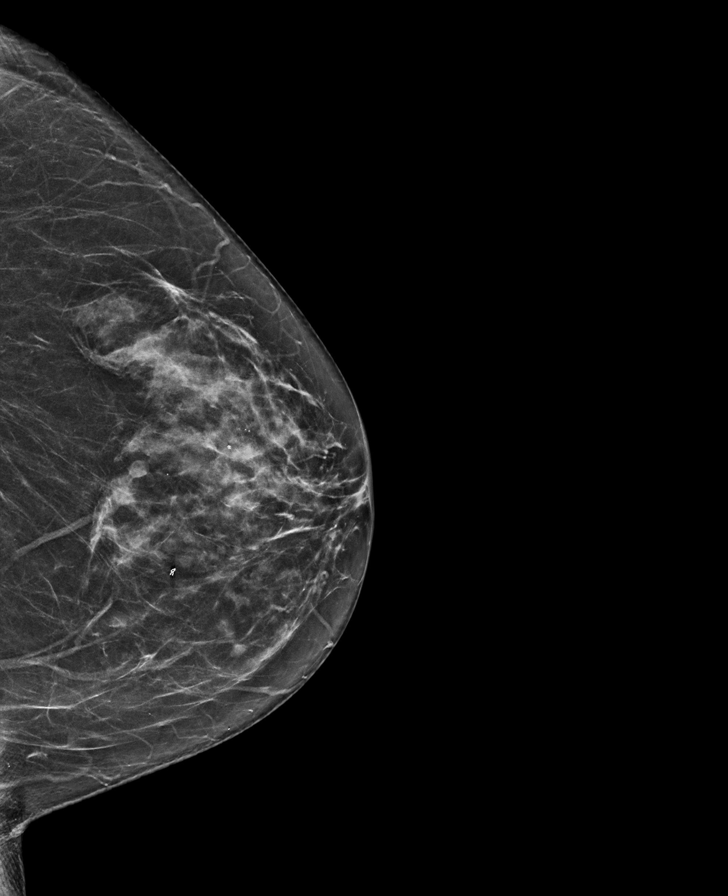

[R MLO synth-2D]
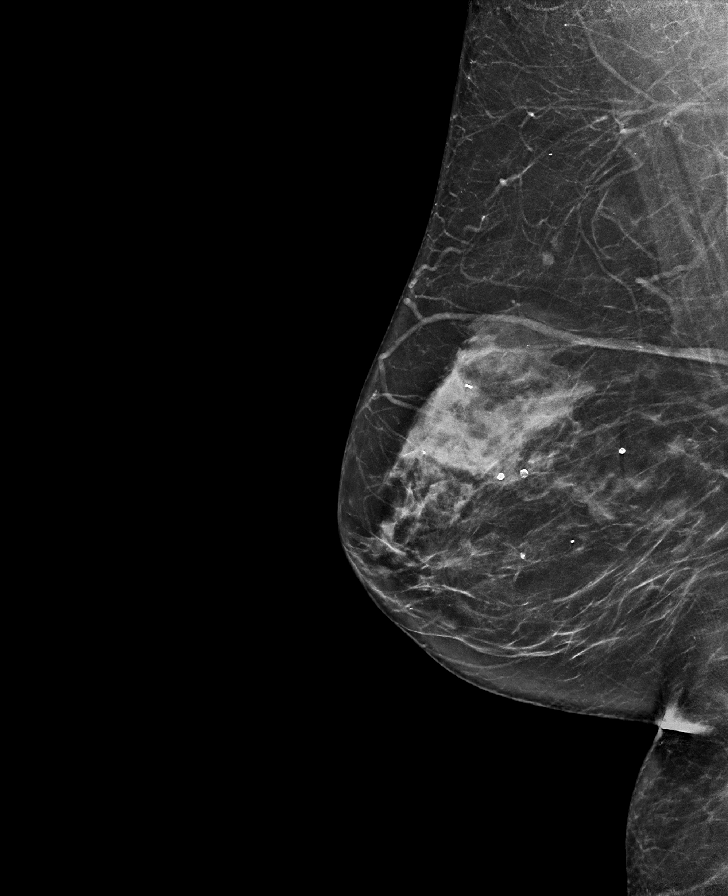

[L CC tomo · tomo slice 34/67.0]
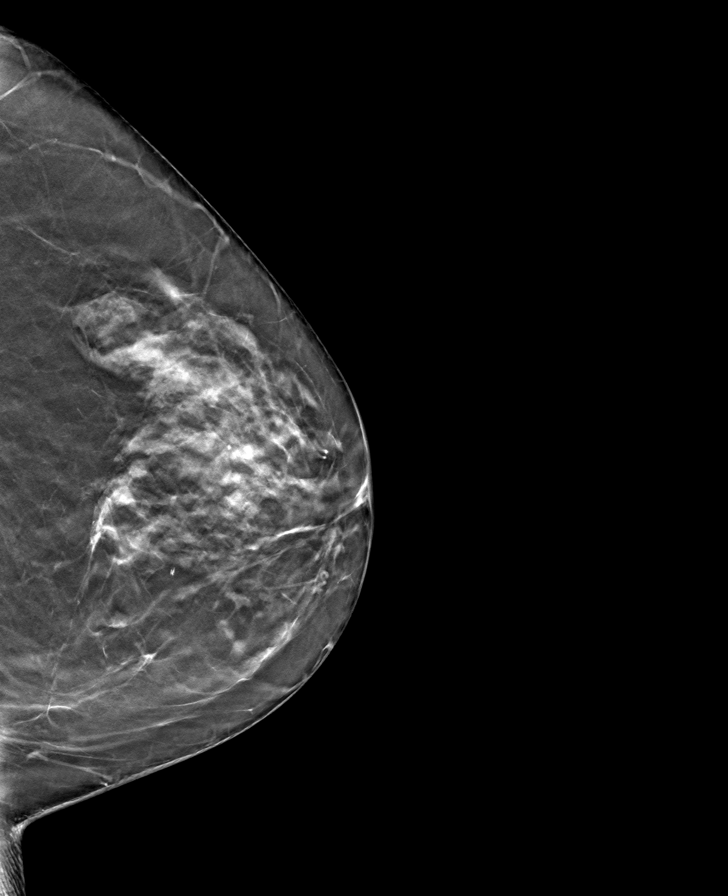

[R CC tomo · tomo slice 33/64.0]
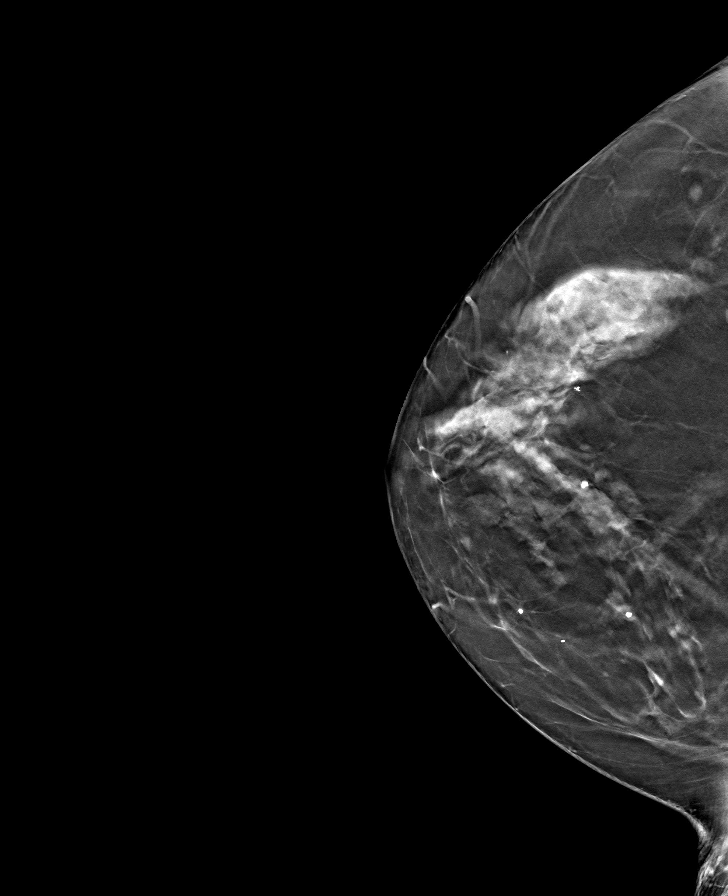

[R MLO tomo · tomo slice 37/74.0]
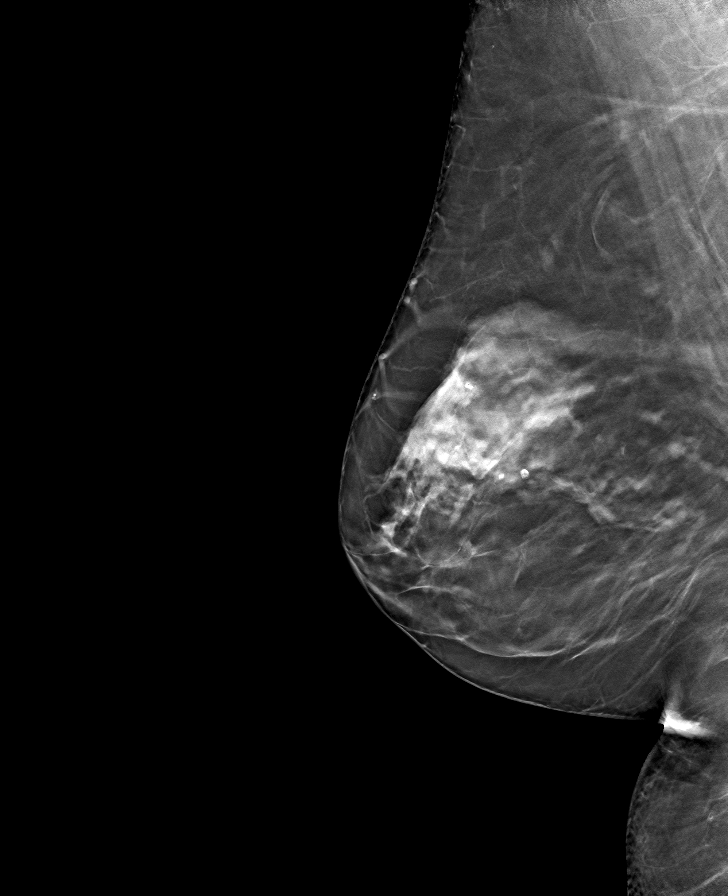

[L MLO tomo · tomo slice 37/74.0]
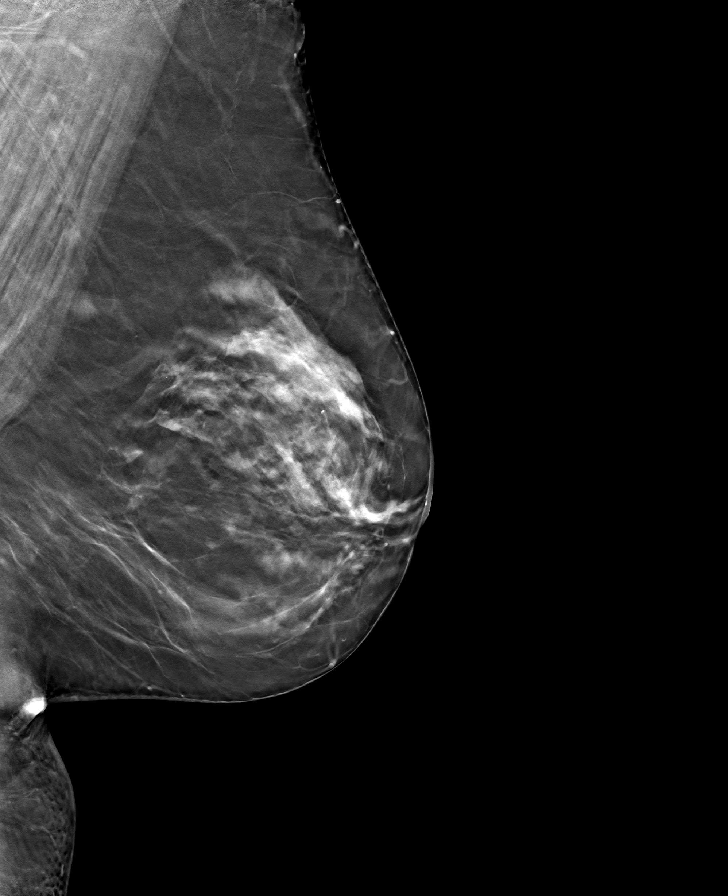

[8 of 24 positions shown; findings below may reference images not displayed]

ACR Breast Density Category c: The breast tissue is heterogeneously
dense, which may obscure small masses.
FINDINGS: There are no findings suspicious for malignancy. Images were
processed with CAD.
IMPRESSION: No mammographic evidence of malignancy. A result letter of this
screening mammogram will be mailed directly to the patient.

RECOMMENDATION:
Screening mammogram in one year. (Code:FT-U-LHB)

BI-RADS CATEGORY  1: Negative.

## 2022-09-02 ENCOUNTER — Emergency Department (HOSPITAL_BASED_OUTPATIENT_CLINIC_OR_DEPARTMENT_OTHER)
Admission: EM | Admit: 2022-09-02 | Discharge: 2022-09-02 | Disposition: A | Discharge status: Home / Self Care | Payer: Medicare Other | Attending: Emergency Medicine | Admitting: Emergency Medicine

## 2022-09-02 ENCOUNTER — Other Ambulatory Visit: Payer: Self-pay

## 2022-09-02 ENCOUNTER — Encounter (HOSPITAL_BASED_OUTPATIENT_CLINIC_OR_DEPARTMENT_OTHER): Payer: Self-pay | Admitting: Emergency Medicine

## 2022-09-02 DIAGNOSIS — R109 Unspecified abdominal pain: Secondary | ICD-10-CM | POA: Diagnosis present

## 2022-09-02 DIAGNOSIS — N3000 Acute cystitis without hematuria: Secondary | ICD-10-CM | POA: Diagnosis not present

## 2022-09-02 DIAGNOSIS — R1084 Generalized abdominal pain: Secondary | ICD-10-CM

## 2022-09-02 LAB — CBC WITH DIFFERENTIAL/PLATELET
Abs Immature Granulocytes: 0.03 10*3/uL (ref 0.00–0.07)
Basophils Absolute: 0.1 10*3/uL (ref 0.0–0.1)
Basophils Relative: 1 %
Eosinophils Absolute: 0.1 10*3/uL (ref 0.0–0.5)
Eosinophils Relative: 1 %
HCT: 44.1 % (ref 36.0–46.0)
Hemoglobin: 15.2 g/dL — ABNORMAL HIGH (ref 12.0–15.0)
Immature Granulocytes: 0 %
Lymphocytes Relative: 26 %
Lymphs Abs: 2.5 10*3/uL (ref 0.7–4.0)
MCH: 30.6 pg (ref 26.0–34.0)
MCHC: 34.5 g/dL (ref 30.0–36.0)
MCV: 88.7 fL (ref 80.0–100.0)
Monocytes Absolute: 0.5 10*3/uL (ref 0.1–1.0)
Monocytes Relative: 6 %
Neutro Abs: 6.2 10*3/uL (ref 1.7–7.7)
Neutrophils Relative %: 66 %
Platelets: 245 10*3/uL (ref 150–400)
RBC: 4.97 MIL/uL (ref 3.87–5.11)
RDW: 13.7 % (ref 11.5–15.5)
WBC: 9.3 10*3/uL (ref 4.0–10.5)
nRBC: 0 % (ref 0.0–0.2)

## 2022-09-02 LAB — URINALYSIS, ROUTINE W REFLEX MICROSCOPIC
Glucose, UA: NEGATIVE mg/dL
Hgb urine dipstick: NEGATIVE
Ketones, ur: 15 mg/dL — AB
Nitrite: POSITIVE — AB
Protein, ur: NEGATIVE mg/dL
Specific Gravity, Urine: 1.025 (ref 1.005–1.030)
pH: 5.5 (ref 5.0–8.0)

## 2022-09-02 LAB — COMPREHENSIVE METABOLIC PANEL
ALT: 28 U/L (ref 0–44)
AST: 37 U/L (ref 15–41)
Albumin: 3.7 g/dL (ref 3.5–5.0)
Alkaline Phosphatase: 85 U/L (ref 38–126)
Anion gap: 7 (ref 5–15)
BUN: 11 mg/dL (ref 8–23)
CO2: 25 mmol/L (ref 22–32)
Calcium: 9.3 mg/dL (ref 8.9–10.3)
Chloride: 105 mmol/L (ref 98–111)
Creatinine, Ser: 0.8 mg/dL (ref 0.44–1.00)
GFR, Estimated: >60 mL/min (ref >60)
Glucose, Bld: 119 mg/dL — ABNORMAL HIGH (ref 70–99)
Potassium: 4.2 mmol/L (ref 3.5–5.1)
Sodium: 137 mmol/L (ref 135–145)
Total Bilirubin: 1.2 mg/dL (ref 0.3–1.2)
Total Protein: 7.6 g/dL (ref 6.5–8.1)

## 2022-09-02 LAB — URINALYSIS, MICROSCOPIC (REFLEX)

## 2022-09-02 LAB — LIPASE, BLOOD: Lipase: 34 U/L (ref 11–51)

## 2022-09-02 MED ORDER — ALUM & MAG HYDROXIDE-SIMETH 200-200-20 MG/5ML PO SUSP
30.0000 mL | Freq: Once | ORAL | Status: AC
  Administered 2022-09-02: 30 mL via ORAL
  Filled 2022-09-02: qty 30

## 2022-09-02 MED ORDER — PANTOPRAZOLE SODIUM 20 MG PO TBEC
20.0000 mg | DELAYED_RELEASE_TABLET | Freq: Once | ORAL | Status: DC
  Filled 2022-09-02: qty 1

## 2022-09-02 MED ORDER — CEPHALEXIN 500 MG PO CAPS: 1000.0000 mg | ORAL_CAPSULE | Freq: Two times a day (BID) | ORAL | 0 refills | Status: DC

## 2022-09-02 MED ORDER — PANTOPRAZOLE SODIUM 40 MG PO TBEC
40.0000 mg | DELAYED_RELEASE_TABLET | Freq: Every day | ORAL | Status: DC
  Administered 2022-09-02: 40 mg via ORAL
  Filled 2022-09-02: qty 1

## 2022-09-02 NOTE — ED Notes (Signed)
Pt given second pillow and warm blanket. Pt comfort measures met.

## 2022-09-02 NOTE — ED Provider Notes (Signed)
[facility] EMERGENCY DEPARTMENT Provider Note   CSN: [id number] Arrival date & time: 09/02/22  1353     History  Chief Complaint  Patient presents with   Abdominal Pain    Melinda [name] is a 66 y.o. female with medical history of acid reflux, esophageal dilation, abdominal hysterectomy.  Patient presents to ED for evaluation abdominal pain.  Patient reports that beginning yesterday she developed abdominal pain when she swallows liquid or solids.  The patient states she does have a remote history of esophageal dilation however denies any sensation of food bolus.  Patient states that she also has diagnosis of GERD however denies any PPI therapy.  Patient states she has been taking Tums at home with relief of symptoms however symptoms always return.  The patient reports her last bowel movement was this morning, it was normal.  Patient denies any dysuria, fevers, nausea, vomiting or diarrhea.  Patient denies any blood in stool, excessive alcohol use, excessive ibuprofen use.   Abdominal Pain Associated symptoms: no diarrhea, no dysuria, no fever, no nausea and no vomiting        Home Medications Prior to Admission medications   Medication Sig Start Date End Date Taking? Authorizing Provider  cephALEXin (KEFLEX) 500 MG capsule Take 2 capsules (1,000 mg total) by mouth 2 (two) times daily. 09/02/22  Yes Al Decant, PA-C  benzonatate (TESSALON) 100 MG capsule Take 1 capsule (100 mg total) by mouth every 8 (eight) hours. 07/18/20   Renne Crigler, PA-C  dicyclomine (BENTYL) 20 MG tablet Take 1 tablet (20 mg total) by mouth 2 (two) times daily. 01/18/21   Gailen Shelter, PA  methylPREDNISolone (MEDROL DOSEPAK) 4 MG TBPK tablet 6 day dose pack - take as directed 06/11/20   Edwin Cap, DPM  omeprazole (PRILOSEC) 20 MG capsule Take 1 capsule (20 mg total) by mouth daily. 01/18/21   Gailen Shelter, PA  ondansetron (ZOFRAN ODT) 4 MG disintegrating tablet Take 1 tablet (4  mg total) by mouth every 8 (eight) hours as needed for nausea or vomiting. 01/18/21   Gailen Shelter, PA      Allergies    Aspirin, Succinylcholine, Tramadol, and Tizanidine    Review of Systems   Review of Systems  Constitutional:  Negative for fever.  Gastrointestinal:  Positive for abdominal pain. Negative for diarrhea, nausea and vomiting.  Genitourinary:  Negative for dysuria and flank pain.  Musculoskeletal:  Negative for back pain.  All other systems reviewed and are negative.   Physical Exam Updated Vital Signs BP (!) 175/90   Pulse 72   Temp 97.6 F (36.4 C) (Oral)   Resp 14   Ht 5\' 6"  (1.676 m)   Wt 67.1 kg   SpO2 100%   BMI 23.89 kg/m  Physical Exam Vitals and nursing note reviewed.  Constitutional:      General: She is not in acute distress.    Appearance: Normal appearance. She is not ill-appearing, toxic-appearing or diaphoretic.  HENT:     Head: Normocephalic and atraumatic.     Nose: Nose normal. No congestion.     Mouth/Throat:     Mouth: Mucous membranes are moist.     Pharynx: Oropharynx is clear.  Eyes:     Extraocular Movements: Extraocular movements intact.     Conjunctiva/sclera: Conjunctivae normal.     Pupils: Pupils are equal, round, and reactive to light.  Cardiovascular:     Rate and Rhythm: Normal rate and regular rhythm.  Pulmonary:     Effort: Pulmonary effort is normal.     Breath sounds: Normal breath sounds. No wheezing.  Abdominal:     General: Abdomen is flat. Bowel sounds are normal. There is no distension.     Palpations: Abdomen is soft.     Tenderness: There is abdominal tenderness. There is no right CVA tenderness, left CVA tenderness, guarding or rebound.     Comments: Patient abdomen soft and compressible.  There is no tenderness elicited however the patient does state that she is having abdominal pain.  When I push on the patient's abdomen she does not react.  Musculoskeletal:     Cervical back: Normal range of motion  and neck supple. No tenderness.  Skin:    General: Skin is warm and dry.     Capillary Refill: Capillary refill takes less than 2 seconds.  Neurological:     Mental Status: She is alert and oriented to person, place, and time.     ED Results / Procedures / Treatments   Labs (all labs ordered are listed, but only abnormal results are displayed) Labs Reviewed  CBC WITH DIFFERENTIAL/PLATELET - Abnormal; Notable for the following components:      Result Value   Hemoglobin 15.2 (*)    All other components within normal limits  COMPREHENSIVE METABOLIC PANEL - Abnormal; Notable for the following components:   Glucose, Bld 119 (*)    All other components within normal limits  URINALYSIS, ROUTINE W REFLEX MICROSCOPIC - Abnormal; Notable for the following components:   APPearance CLOUDY (*)    Bilirubin Urine SMALL (*)    Ketones, ur 15 (*)    Nitrite POSITIVE (*)    Leukocytes,Ua SMALL (*)    All other components within normal limits  URINALYSIS, MICROSCOPIC (REFLEX) - Abnormal; Notable for the following components:   Bacteria, UA MANY (*)    All other components within normal limits  URINE CULTURE  LIPASE, BLOOD    EKG None  Radiology No results found.  Procedures Procedures   Medications Ordered in ED Medications  pantoprazole (PROTONIX) EC tablet 40 mg (40 mg Oral Given 09/02/22 1559)  alum & mag hydroxide-simeth (MAALOX/MYLANTA) 200-200-20 MG/5ML suspension 30 mL (30 mLs Oral Given 09/02/22 1558)    ED Course/ Medical Decision Making/ A&P                           Medical Decision Making Amount and/or Complexity of Data Reviewed Labs: ordered.  Risk OTC drugs. Prescription drug management.   66 year old female presents to ED for evaluation.  Please see HPI for further details.  On my examination the patient is afebrile and nontachycardic.  The patient and sounds are clear bilaterally, she is not hypoxic.  Patient abdomen is soft and compressible throughout  however the patient does state that she is having abdominal pain however there is no reaction elicited.  The patient neurological examination shows no focal neurodeficits.  The patient is nontoxic in appearance.  The patient was given GI cocktail, Protonix.  The patient denies any change in her abdominal discomfort at this time.  Patient labs to be collected include CMP, CBC, lipase, urinalysis.  Patient CBC unremarkable, no leukocytosis.  Patient CMP unremarkable.  Patient lipase not elevated.  The patient urinalysis shows positive nitrite urine, small leukocytes, small bilirubin, ketones.  The patient denies any dysuria, flank pain.  Patient urine will be cultured.    The patient was sent  home on Keflex.  Patient also be referred to GI for further management.  Patient was offered CT scan.  Shared decision-making conversation was had.  I did advise the patient that there was no elicited tenderness or reaction on my abdominal examination and as result I do not feel strongly about CT scanning her abdomen at this point.  I advised the patient that if she felt strongly about having imaging done, we could proceed with that however she denied this at this time.  Patient will be sent home on Keflex.  Patient advised to follow-up with GI doctor which I will refer her to.  The patient is stable for discharge.  Final Clinical Impression(s) / ED Diagnoses Final diagnoses:  Generalized abdominal pain  Acute cystitis without hematuria    Rx / DC Orders ED Discharge Orders          Ordered    cephALEXin (KEFLEX) 500 MG capsule  2 times daily        09/02/22 1655              Azucena Cecil, [place] 09/02/22 1657    Sherwood Gambler, MD 09/02/22 1753

## 2022-09-02 NOTE — ED Triage Notes (Signed)
Pt reports pain in esophagus after eating; the pain moves down to stomach and causes bloating and sharp pain; sxs x 2d

## 2022-09-02 NOTE — Discharge Instructions (Signed)
Return to the ED with any new or worsening signs or symptoms Please follow-up with GI.  Please call and make an appointment to be seen utilizing the number provided on this form Please begin taking antibiotics Keflex twice daily for the next 7 days. Please read attached guide concerning urinary tract infection, abdominal pain

## 2022-09-04 LAB — URINE CULTURE: Culture: >=100000 — AB

## 2022-09-05 ENCOUNTER — Telehealth (HOSPITAL_BASED_OUTPATIENT_CLINIC_OR_DEPARTMENT_OTHER): Payer: Self-pay | Admitting: *Deleted

## 2022-09-05 NOTE — Telephone Encounter (Signed)
Post ED Visit - Positive Culture Follow-up  Culture report reviewed by antimicrobial stewardship pharmacist: [facility] Team []  Elenor Quinones, Pharm.D. []  Heide Guile, Pharm.D., BCPS AQ-ID []  Parks Neptune, Pharm.D., BCPS []  Alycia Rossetti, Pharm.D., BCPS []  [place], Pharm.D., BCPS, AAHIVP []  Legrand Como, Pharm.D., BCPS, AAHIVP []  Salome Arnt, PharmD, BCPS []  Johnnette Gourd, PharmD, BCPS []  Hughes Better, PharmD, BCPS []  Leeroy Cha, PharmD []  Laqueta Linden, PharmD, BCPS []  Albertina Parr, PharmD  [facility] Team []  Leodis Sias, PharmD []  Lindell Spar, PharmD []  Royetta Asal, PharmD []  Graylin Shiver, Rph []  Rema Fendt) Glennon Mac, PharmD []  Arlyn Dunning, PharmD []  Netta Cedars, PharmD []  Dia Sitter, PharmD []  Leone Haven, PharmD []  Gretta Arab, PharmD []  Theodis Shove, PharmD []  Peggyann Juba, PharmD []  Reuel Boom, PharmD   Positive urine culture Treated with Fosfomycin, organism sensitive to the same and no further patient follow-up is required at this time. Jeneen Rinks, Pharm D  Ardeen Fillers 09/05/2022, 8:53 AM

## 2022-11-09 ENCOUNTER — Emergency Department (HOSPITAL_BASED_OUTPATIENT_CLINIC_OR_DEPARTMENT_OTHER): Payer: 59

## 2022-11-09 ENCOUNTER — Other Ambulatory Visit: Payer: Self-pay

## 2022-11-09 ENCOUNTER — Emergency Department (HOSPITAL_BASED_OUTPATIENT_CLINIC_OR_DEPARTMENT_OTHER)
Admission: EM | Admit: 2022-11-09 | Discharge: 2022-11-09 | Disposition: A | Discharge status: Home / Self Care | Payer: 59 | Attending: Emergency Medicine | Admitting: Emergency Medicine

## 2022-11-09 DIAGNOSIS — J101 Influenza due to other identified influenza virus with other respiratory manifestations: Secondary | ICD-10-CM | POA: Diagnosis not present

## 2022-11-09 DIAGNOSIS — F17210 Nicotine dependence, cigarettes, uncomplicated: Secondary | ICD-10-CM | POA: Insufficient documentation

## 2022-11-09 DIAGNOSIS — X58XXXA Exposure to other specified factors, initial encounter: Secondary | ICD-10-CM | POA: Diagnosis not present

## 2022-11-09 DIAGNOSIS — Z1152 Encounter for screening for COVID-19: Secondary | ICD-10-CM | POA: Insufficient documentation

## 2022-11-09 DIAGNOSIS — R062 Wheezing: Secondary | ICD-10-CM

## 2022-11-09 DIAGNOSIS — S8011XA Contusion of right lower leg, initial encounter: Secondary | ICD-10-CM | POA: Insufficient documentation

## 2022-11-09 DIAGNOSIS — R059 Cough, unspecified: Secondary | ICD-10-CM | POA: Diagnosis present

## 2022-11-09 LAB — RESP PANEL BY RT-PCR (RSV, FLU A&B, COVID)  RVPGX2
Influenza A by PCR: POSITIVE — AB
Influenza B by PCR: NEGATIVE
Resp Syncytial Virus by PCR: NEGATIVE
SARS Coronavirus 2 by RT PCR: NEGATIVE

## 2022-11-09 MED ORDER — ALBUTEROL SULFATE HFA 108 (90 BASE) MCG/ACT IN AERS
2.0000 | INHALATION_SPRAY | RESPIRATORY_TRACT | Status: DC | PRN
  Administered 2022-11-09: 2 via RESPIRATORY_TRACT
  Filled 2022-11-09: qty 6.7

## 2022-11-09 NOTE — ED Triage Notes (Signed)
Pt reports cold symptoms for about 2 days, cough, fever, runny nose, vomiting d/t coughing.

## 2022-11-09 NOTE — ED Notes (Signed)
Patient transported to X-ray 

## 2022-11-09 NOTE — ED Provider Notes (Signed)
[place] EMERGENCY DEPARTMENT Provider Note   CSN: [id number] Arrival date & time: 11/09/22  0645     History  Chief Complaint  Patient presents with   Cough    Melinda [name] is a 67 y.o. female.  Patient with no history of diagnosed lung or heart problems or blood clots presents with recurrent cough congestion for 2 days.  Intermittent fevers and intermittent vomiting only with coughing.  No active chest pain.  Close contact with similar.  Patient is cigarette smoker.       Home Medications Prior to Admission medications   Medication Sig Start Date End Date Taking? Authorizing Provider  benzonatate (TESSALON) 100 MG capsule Take 1 capsule (100 mg total) by mouth every 8 (eight) hours. 07/18/20   Carlisle Cater, PA-C  cephALEXin (KEFLEX) 500 MG capsule Take 2 capsules (1,000 mg total) by mouth 2 (two) times daily. 09/02/22   Azucena Cecil, PA-C  dicyclomine (BENTYL) 20 MG tablet Take 1 tablet (20 mg total) by mouth 2 (two) times daily. 01/18/21   Tedd Sias, PA  methylPREDNISolone (MEDROL DOSEPAK) 4 MG TBPK tablet 6 day dose pack - take as directed 06/11/20   Criselda Peaches, DPM  omeprazole (PRILOSEC) 20 MG capsule Take 1 capsule (20 mg total) by mouth daily. 01/18/21   Tedd Sias, PA  ondansetron (ZOFRAN ODT) 4 MG disintegrating tablet Take 1 tablet (4 mg total) by mouth every 8 (eight) hours as needed for nausea or vomiting. 01/18/21   Tedd Sias, PA      Allergies    Aspirin, Succinylcholine, Tramadol, and Tizanidine    Review of Systems   Review of Systems  Constitutional:  Negative for chills and fever.  HENT:  Positive for congestion.   Eyes:  Negative for visual disturbance.  Respiratory:  Positive for cough. Negative for shortness of breath.   Cardiovascular:  Negative for chest pain.  Gastrointestinal:  Negative for abdominal pain and vomiting.  Genitourinary:  Negative for dysuria and flank pain.  Musculoskeletal:  Negative  for back pain, neck pain and neck stiffness.  Skin:  Negative for rash.  Neurological:  Negative for light-headedness and headaches.    Physical Exam Updated Vital Signs BP 126/73 (BP Location: Right Arm)   Pulse 69   Temp 98.7 F (37.1 C) (Oral)   Resp 18   SpO2 97%  Physical Exam Vitals and nursing note reviewed.  Constitutional:      General: She is not in acute distress.    Appearance: She is well-developed.  HENT:     Head: Normocephalic and atraumatic.     Nose: Congestion present.     Mouth/Throat:     Mouth: Mucous membranes are moist.  Eyes:     General:        Right eye: No discharge.        Left eye: No discharge.     Conjunctiva/sclera: Conjunctivae normal.  Neck:     Trachea: No tracheal deviation.  Cardiovascular:     Rate and Rhythm: Normal rate and regular rhythm.  Pulmonary:     Effort: Pulmonary effort is normal.     Breath sounds: Wheezing present.  Abdominal:     General: There is no distension.     Palpations: Abdomen is soft.     Tenderness: There is no abdominal tenderness. There is no guarding.  Musculoskeletal:        General: Swelling and tenderness present. No deformity.  Cervical back: Normal range of motion and neck supple. No rigidity.     Comments: Patient has mild tenderness and swelling lateral femoral head without signs of infection, compartments soft neurovascular intact right lower extremity no other tenderness  Skin:    General: Skin is warm.     Capillary Refill: Capillary refill takes less than 2 seconds.     Findings: No rash.  Neurological:     General: No focal deficit present.     Mental Status: She is alert.     Cranial Nerves: No cranial nerve deficit.  Psychiatric:        Mood and Affect: Mood normal.     ED Results / Procedures / Treatments   Labs (all labs ordered are listed, but only abnormal results are displayed) Labs Reviewed  RESP PANEL BY RT-PCR (RSV, FLU A&B, COVID)  RVPGX2 - Abnormal; Notable for  the following components:      Result Value   Influenza A by PCR POSITIVE (*)    All other components within normal limits    EKG None  Radiology DG Chest 2 View  Result Date: 11/09/2022 CLINICAL DATA:  Fall.  Cough. EXAM: CHEST - 2 VIEW COMPARISON:  CXR 08/19/17 FINDINGS: No pleural effusion. No pneumothorax. Normal cardiac and mediastinal contours. Partially visualized cervical spinal fusion hardware in place. Compared to prior exam there is interval increase in prominence of bilateral interstitial opacities, which can be seen in the setting of atypical infection. Vertebral body heights are maintained. No displaced rib fractures. IMPRESSION: 1. Interval increase in prominence of bilateral interstitial opacities, which can be seen in the setting of atypical infection. 2. No displaced rib fractures. No pneumothorax. Electronically Signed   By: Marin Roberts M.D.   On: 11/09/2022 08:08    Procedures Procedures    Medications Ordered in ED Medications  albuterol (VENTOLIN HFA) 108 (90 Base) MCG/ACT inhaler 2 puff (has no administration in time range)    ED Course/ Medical Decision Making/ A&P                           Medical Decision Making Amount and/or Complexity of Data Reviewed Radiology: ordered.  Risk Prescription drug management.    Patient presents with recurrent respiratory symptoms and minimal wheezing consistent with viral syndrome and possibly secondary bronchospasm or undiagnosed COPD.  Patient is overall well-appearing, well-hydrated, vital signs reviewed normal and normal oxygenation with normal work of breathing.  No concern at this time for more significant pathology such as heart failure or blood clots.  Supportive care discussed and chest x-ray ordered to look for any infiltrate.  Patient is also had right lateral femoral head pain since she accidentally kicked a table 3 weeks ago x-ray ordered to look for any occult fracture.  Supportive care discussed and  reasons to return with patient is comfortable this plan.  Chest x-ray independently reviewed showing signs of atypical infection with influenza positive plan for supportive care and repeat chest x-ray by primary doctor in 1 to 2 weeks.  X-ray of the right lower extremity showed no acute fracture independently reviewed by myself.        Final Clinical Impression(s) / ED Diagnoses Final diagnoses:  Contusion of right leg, initial encounter  Wheezing  Influenza A    Rx / DC Orders ED Discharge Orders     None         Elnora Morrison, MD 11/09/22 [id number]

## 2022-11-09 NOTE — Discharge Instructions (Addendum)
Have your chest x-ray repeated by primary care doctor in a few weeks. Stay well-hydrated and use Tylenol or ibuprofen as needed every 6 hours for body aches, fevers or pain.  Return for breathing difficulty or new concerns.  You can try honey or tea with lemon for cough.

## 2022-11-13 ENCOUNTER — Inpatient Hospital Stay (HOSPITAL_BASED_OUTPATIENT_CLINIC_OR_DEPARTMENT_OTHER)
Admission: EM | Admit: 2022-11-13 | Discharge: 2022-11-30 | DRG: 981 | Disposition: E | Payer: 59 | Attending: Student | Admitting: Student

## 2022-11-13 ENCOUNTER — Encounter (HOSPITAL_COMMUNITY): Payer: Self-pay

## 2022-11-13 ENCOUNTER — Other Ambulatory Visit: Payer: Self-pay

## 2022-11-13 ENCOUNTER — Emergency Department (HOSPITAL_BASED_OUTPATIENT_CLINIC_OR_DEPARTMENT_OTHER): Payer: 59

## 2022-11-13 ENCOUNTER — Encounter (HOSPITAL_BASED_OUTPATIENT_CLINIC_OR_DEPARTMENT_OTHER): Payer: Self-pay | Admitting: Emergency Medicine

## 2022-11-13 DIAGNOSIS — Z79899 Other long term (current) drug therapy: Secondary | ICD-10-CM

## 2022-11-13 DIAGNOSIS — E876 Hypokalemia: Secondary | ICD-10-CM | POA: Diagnosis not present

## 2022-11-13 DIAGNOSIS — J9601 Acute respiratory failure with hypoxia: Secondary | ICD-10-CM | POA: Diagnosis not present

## 2022-11-13 DIAGNOSIS — J440 Chronic obstructive pulmonary disease with acute lower respiratory infection: Secondary | ICD-10-CM | POA: Diagnosis present

## 2022-11-13 DIAGNOSIS — I10 Essential (primary) hypertension: Secondary | ICD-10-CM | POA: Diagnosis present

## 2022-11-13 DIAGNOSIS — J101 Influenza due to other identified influenza virus with other respiratory manifestations: Secondary | ICD-10-CM | POA: Diagnosis not present

## 2022-11-13 DIAGNOSIS — R4701 Aphasia: Secondary | ICD-10-CM | POA: Diagnosis not present

## 2022-11-13 DIAGNOSIS — F1011 Alcohol abuse, in remission: Secondary | ICD-10-CM | POA: Diagnosis present

## 2022-11-13 DIAGNOSIS — K219 Gastro-esophageal reflux disease without esophagitis: Secondary | ICD-10-CM | POA: Diagnosis present

## 2022-11-13 DIAGNOSIS — I6601 Occlusion and stenosis of right middle cerebral artery: Secondary | ICD-10-CM | POA: Diagnosis present

## 2022-11-13 DIAGNOSIS — Z87892 Personal history of anaphylaxis: Secondary | ICD-10-CM

## 2022-11-13 DIAGNOSIS — J189 Pneumonia, unspecified organism: Secondary | ICD-10-CM

## 2022-11-13 DIAGNOSIS — F17210 Nicotine dependence, cigarettes, uncomplicated: Secondary | ICD-10-CM | POA: Diagnosis present

## 2022-11-13 DIAGNOSIS — E785 Hyperlipidemia, unspecified: Secondary | ICD-10-CM | POA: Diagnosis present

## 2022-11-13 DIAGNOSIS — J1008 Influenza due to other identified influenza virus with other specified pneumonia: Secondary | ICD-10-CM | POA: Diagnosis not present

## 2022-11-13 DIAGNOSIS — R1110 Vomiting, unspecified: Secondary | ICD-10-CM

## 2022-11-13 DIAGNOSIS — F419 Anxiety disorder, unspecified: Secondary | ICD-10-CM | POA: Diagnosis present

## 2022-11-13 DIAGNOSIS — Z529 Donor of unspecified organ or tissue: Secondary | ICD-10-CM

## 2022-11-13 DIAGNOSIS — J441 Chronic obstructive pulmonary disease with (acute) exacerbation: Secondary | ICD-10-CM | POA: Diagnosis present

## 2022-11-13 DIAGNOSIS — I161 Hypertensive emergency: Secondary | ICD-10-CM | POA: Diagnosis not present

## 2022-11-13 DIAGNOSIS — R739 Hyperglycemia, unspecified: Secondary | ICD-10-CM | POA: Diagnosis not present

## 2022-11-13 DIAGNOSIS — Z885 Allergy status to narcotic agent status: Secondary | ICD-10-CM

## 2022-11-13 DIAGNOSIS — Z8719 Personal history of other diseases of the digestive system: Secondary | ICD-10-CM

## 2022-11-13 DIAGNOSIS — Z886 Allergy status to analgesic agent status: Secondary | ICD-10-CM

## 2022-11-13 DIAGNOSIS — E871 Hypo-osmolality and hyponatremia: Secondary | ICD-10-CM | POA: Diagnosis present

## 2022-11-13 DIAGNOSIS — I63531 Cerebral infarction due to unspecified occlusion or stenosis of right posterior cerebral artery: Secondary | ICD-10-CM | POA: Diagnosis present

## 2022-11-13 DIAGNOSIS — J09X1 Influenza due to identified novel influenza A virus with pneumonia: Secondary | ICD-10-CM | POA: Diagnosis present

## 2022-11-13 DIAGNOSIS — I771 Stricture of artery: Secondary | ICD-10-CM | POA: Diagnosis present

## 2022-11-13 DIAGNOSIS — Z1152 Encounter for screening for COVID-19: Secondary | ICD-10-CM

## 2022-11-13 DIAGNOSIS — G894 Chronic pain syndrome: Secondary | ICD-10-CM | POA: Diagnosis present

## 2022-11-13 DIAGNOSIS — G936 Cerebral edema: Secondary | ICD-10-CM | POA: Diagnosis not present

## 2022-11-13 DIAGNOSIS — R1310 Dysphagia, unspecified: Secondary | ICD-10-CM | POA: Diagnosis present

## 2022-11-13 DIAGNOSIS — I63541 Cerebral infarction due to unspecified occlusion or stenosis of right cerebellar artery: Secondary | ICD-10-CM | POA: Diagnosis present

## 2022-11-13 DIAGNOSIS — I63511 Cerebral infarction due to unspecified occlusion or stenosis of right middle cerebral artery: Secondary | ICD-10-CM | POA: Diagnosis present

## 2022-11-13 DIAGNOSIS — E870 Hyperosmolality and hypernatremia: Secondary | ICD-10-CM | POA: Diagnosis not present

## 2022-11-13 DIAGNOSIS — I998 Other disorder of circulatory system: Secondary | ICD-10-CM | POA: Diagnosis present

## 2022-11-13 DIAGNOSIS — J449 Chronic obstructive pulmonary disease, unspecified: Principal | ICD-10-CM

## 2022-11-13 DIAGNOSIS — J111 Influenza due to unidentified influenza virus with other respiratory manifestations: Secondary | ICD-10-CM | POA: Diagnosis present

## 2022-11-13 DIAGNOSIS — Z789 Other specified health status: Secondary | ICD-10-CM

## 2022-11-13 DIAGNOSIS — G935 Compression of brain: Secondary | ICD-10-CM | POA: Diagnosis not present

## 2022-11-13 DIAGNOSIS — G8194 Hemiplegia, unspecified affecting left nondominant side: Secondary | ICD-10-CM | POA: Diagnosis not present

## 2022-11-13 DIAGNOSIS — Z66 Do not resuscitate: Secondary | ICD-10-CM | POA: Diagnosis not present

## 2022-11-13 DIAGNOSIS — G9349 Other encephalopathy: Secondary | ICD-10-CM | POA: Diagnosis not present

## 2022-11-13 DIAGNOSIS — I63411 Cerebral infarction due to embolism of right middle cerebral artery: Secondary | ICD-10-CM | POA: Diagnosis not present

## 2022-11-13 DIAGNOSIS — Z888 Allergy status to other drugs, medicaments and biological substances status: Secondary | ICD-10-CM

## 2022-11-13 DIAGNOSIS — J159 Unspecified bacterial pneumonia: Secondary | ICD-10-CM | POA: Diagnosis present

## 2022-11-13 DIAGNOSIS — Z803 Family history of malignant neoplasm of breast: Secondary | ICD-10-CM

## 2022-11-13 DIAGNOSIS — I7772 Dissection of iliac artery: Secondary | ICD-10-CM | POA: Diagnosis present

## 2022-11-13 DIAGNOSIS — Z515 Encounter for palliative care: Secondary | ICD-10-CM

## 2022-11-13 DIAGNOSIS — Z781 Physical restraint status: Secondary | ICD-10-CM

## 2022-11-13 DIAGNOSIS — Z833 Family history of diabetes mellitus: Secondary | ICD-10-CM

## 2022-11-13 DIAGNOSIS — F32A Depression, unspecified: Secondary | ICD-10-CM | POA: Diagnosis present

## 2022-11-13 DIAGNOSIS — Z90710 Acquired absence of both cervix and uterus: Secondary | ICD-10-CM

## 2022-11-13 DIAGNOSIS — F1010 Alcohol abuse, uncomplicated: Secondary | ICD-10-CM | POA: Diagnosis present

## 2022-11-13 DIAGNOSIS — F17200 Nicotine dependence, unspecified, uncomplicated: Secondary | ICD-10-CM | POA: Diagnosis present

## 2022-11-13 DIAGNOSIS — R29727 NIHSS score 27: Secondary | ICD-10-CM | POA: Diagnosis not present

## 2022-11-13 LAB — CBC WITH DIFFERENTIAL/PLATELET
Abs Immature Granulocytes: 0.03 10*3/uL (ref 0.00–0.07)
Basophils Absolute: 0 10*3/uL (ref 0.0–0.1)
Basophils Relative: 0 %
Eosinophils Absolute: 0 10*3/uL (ref 0.0–0.5)
Eosinophils Relative: 0 %
HCT: 41.9 % (ref 36.0–46.0)
Hemoglobin: 14.2 g/dL (ref 12.0–15.0)
Immature Granulocytes: 0 %
Lymphocytes Relative: 10 %
Lymphs Abs: 0.9 10*3/uL (ref 0.7–4.0)
MCH: 30.1 pg (ref 26.0–34.0)
MCHC: 33.9 g/dL (ref 30.0–36.0)
MCV: 88.8 fL (ref 80.0–100.0)
Monocytes Absolute: 0.6 10*3/uL (ref 0.1–1.0)
Monocytes Relative: 6 %
Neutro Abs: 7.6 10*3/uL (ref 1.7–7.7)
Neutrophils Relative %: 84 %
Platelets: 174 10*3/uL (ref 150–400)
RBC: 4.72 MIL/uL (ref 3.87–5.11)
RDW: 13.6 % (ref 11.5–15.5)
WBC: 9.2 10*3/uL (ref 4.0–10.5)
nRBC: 0 % (ref 0.0–0.2)

## 2022-11-13 LAB — COMPREHENSIVE METABOLIC PANEL
ALT: 53 U/L — ABNORMAL HIGH (ref 0–44)
AST: 89 U/L — ABNORMAL HIGH (ref 15–41)
Albumin: 2.9 g/dL — ABNORMAL LOW (ref 3.5–5.0)
Alkaline Phosphatase: 97 U/L (ref 38–126)
Anion gap: 11 (ref 5–15)
BUN: 13 mg/dL (ref 8–23)
CO2: 26 mmol/L (ref 22–32)
Calcium: 8.4 mg/dL — ABNORMAL LOW (ref 8.9–10.3)
Chloride: 95 mmol/L — ABNORMAL LOW (ref 98–111)
Creatinine, Ser: 0.88 mg/dL (ref 0.44–1.00)
GFR, Estimated: >60 mL/min (ref >60)
Glucose, Bld: 135 mg/dL — ABNORMAL HIGH (ref 70–99)
Potassium: 4 mmol/L (ref 3.5–5.1)
Sodium: 132 mmol/L — ABNORMAL LOW (ref 135–145)
Total Bilirubin: 0.6 mg/dL (ref 0.3–1.2)
Total Protein: 7.3 g/dL (ref 6.5–8.1)

## 2022-11-13 LAB — RESP PANEL BY RT-PCR (RSV, FLU A&B, COVID)  RVPGX2
Influenza A by PCR: POSITIVE — AB
Influenza B by PCR: NEGATIVE
Resp Syncytial Virus by PCR: NEGATIVE
SARS Coronavirus 2 by RT PCR: NEGATIVE

## 2022-11-13 LAB — CBC
HCT: 41 % (ref 36.0–46.0)
Hemoglobin: 13.5 g/dL (ref 12.0–15.0)
MCH: 30.4 pg (ref 26.0–34.0)
MCHC: 32.9 g/dL (ref 30.0–36.0)
MCV: 92.3 fL (ref 80.0–100.0)
Platelets: 176 10*3/uL (ref 150–400)
RBC: 4.44 MIL/uL (ref 3.87–5.11)
RDW: 13.9 % (ref 11.5–15.5)
WBC: 10.7 10*3/uL — ABNORMAL HIGH (ref 4.0–10.5)
nRBC: 0 % (ref 0.0–0.2)

## 2022-11-13 LAB — HIV ANTIBODY (ROUTINE TESTING W REFLEX): HIV Screen 4th Generation wRfx: NONREACTIVE

## 2022-11-13 LAB — CREATININE, SERUM
Creatinine, Ser: 0.8 mg/dL (ref 0.44–1.00)
GFR, Estimated: >60 mL/min (ref >60)

## 2022-11-13 MED ORDER — SODIUM CHLORIDE 0.9 % IV BOLUS
500.0000 mL | Freq: Once | INTRAVENOUS | Status: AC
  Administered 2022-11-13: 500 mL via INTRAVENOUS

## 2022-11-13 MED ORDER — ACETAMINOPHEN 325 MG PO TABS
650.0000 mg | ORAL_TABLET | Freq: Once | ORAL | Status: AC
  Administered 2022-11-13: 650 mg via ORAL
  Filled 2022-11-13: qty 2

## 2022-11-13 MED ORDER — PANTOPRAZOLE SODIUM 40 MG PO TBEC
40.0000 mg | DELAYED_RELEASE_TABLET | Freq: Every day | ORAL | Status: DC
  Administered 2022-11-13 – 2022-11-15 (×3): 40 mg via ORAL
  Filled 2022-11-13 (×3): qty 1

## 2022-11-13 MED ORDER — ALBUTEROL SULFATE (2.5 MG/3ML) 0.083% IN NEBU
2.5000 mg | INHALATION_SOLUTION | RESPIRATORY_TRACT | Status: DC | PRN
  Administered 2022-11-15: 2.5 mg via RESPIRATORY_TRACT
  Filled 2022-11-13 (×2): qty 3

## 2022-11-13 MED ORDER — ENOXAPARIN SODIUM 40 MG/0.4ML IJ SOSY
40.0000 mg | PREFILLED_SYRINGE | INTRAMUSCULAR | Status: DC
  Administered 2022-11-13 – 2022-11-14 (×2): 40 mg via SUBCUTANEOUS
  Filled 2022-11-13 (×2): qty 0.4

## 2022-11-13 MED ORDER — IPRATROPIUM-ALBUTEROL 0.5-2.5 (3) MG/3ML IN SOLN
RESPIRATORY_TRACT | Status: AC
  Filled 2022-11-13: qty 3

## 2022-11-13 MED ORDER — SODIUM CHLORIDE 0.9 % IV SOLN
2.0000 g | INTRAVENOUS | Status: AC
  Administered 2022-11-14 – 2022-11-17 (×4): 2 g via INTRAVENOUS
  Filled 2022-11-13 (×4): qty 20

## 2022-11-13 MED ORDER — ACETAMINOPHEN 325 MG PO TABS
650.0000 mg | ORAL_TABLET | Freq: Four times a day (QID) | ORAL | Status: DC | PRN
  Administered 2022-11-13 – 2022-11-14 (×2): 650 mg via ORAL
  Filled 2022-11-13 (×2): qty 2

## 2022-11-13 MED ORDER — IPRATROPIUM-ALBUTEROL 0.5-2.5 (3) MG/3ML IN SOLN
3.0000 mL | Freq: Four times a day (QID) | RESPIRATORY_TRACT | Status: DC
  Administered 2022-11-13 (×3): 3 mL via RESPIRATORY_TRACT
  Filled 2022-11-13 (×3): qty 3

## 2022-11-13 MED ORDER — SODIUM CHLORIDE 0.9 % IV SOLN
500.0000 mg | INTRAVENOUS | Status: AC
  Administered 2022-11-14 – 2022-11-17 (×4): 500 mg via INTRAVENOUS
  Filled 2022-11-13 (×5): qty 5

## 2022-11-13 MED ORDER — SODIUM CHLORIDE 0.9 % IV SOLN
1.0000 g | Freq: Once | INTRAVENOUS | Status: AC
  Administered 2022-11-13: 1 g via INTRAVENOUS
  Filled 2022-11-13: qty 10

## 2022-11-13 MED ORDER — BENZONATATE 100 MG PO CAPS
100.0000 mg | ORAL_CAPSULE | Freq: Once | ORAL | Status: AC
  Administered 2022-11-13: 100 mg via ORAL
  Filled 2022-11-13: qty 1

## 2022-11-13 MED ORDER — BENZONATATE 100 MG PO CAPS
100.0000 mg | ORAL_CAPSULE | Freq: Three times a day (TID) | ORAL | Status: DC | PRN
  Administered 2022-11-13 – 2022-11-14 (×2): 100 mg via ORAL
  Filled 2022-11-13 (×2): qty 1

## 2022-11-13 MED ORDER — IPRATROPIUM-ALBUTEROL 0.5-2.5 (3) MG/3ML IN SOLN
3.0000 mL | Freq: Once | RESPIRATORY_TRACT | Status: AC
  Administered 2022-11-13: 3 mL via RESPIRATORY_TRACT

## 2022-11-13 MED ORDER — ONDANSETRON HCL 4 MG/2ML IJ SOLN
4.0000 mg | Freq: Once | INTRAMUSCULAR | Status: AC
  Administered 2022-11-13: 4 mg via INTRAVENOUS
  Filled 2022-11-13: qty 2

## 2022-11-13 MED ORDER — ORAL CARE MOUTH RINSE: 15.0000 mL | OROMUCOSAL | Status: DC | PRN

## 2022-11-13 MED ORDER — ALBUTEROL SULFATE (2.5 MG/3ML) 0.083% IN NEBU
INHALATION_SOLUTION | RESPIRATORY_TRACT | Status: AC
  Administered 2022-11-15: 2.5 mg via RESPIRATORY_TRACT
  Filled 2022-11-13: qty 3

## 2022-11-13 MED ORDER — OSELTAMIVIR PHOSPHATE 75 MG PO CAPS
75.0000 mg | ORAL_CAPSULE | Freq: Two times a day (BID) | ORAL | Status: DC
  Administered 2022-11-13 – 2022-11-15 (×4): 75 mg via ORAL
  Filled 2022-11-13 (×6): qty 1

## 2022-11-13 MED ORDER — GUAIFENESIN-DM 100-10 MG/5ML PO SYRP
5.0000 mL | ORAL_SOLUTION | ORAL | Status: DC | PRN
  Administered 2022-11-14 (×2): 5 mL via ORAL
  Filled 2022-11-13 (×2): qty 5

## 2022-11-13 MED ORDER — SODIUM CHLORIDE 0.9 % IV SOLN
500.0000 mg | Freq: Once | INTRAVENOUS | Status: AC
  Administered 2022-11-13: 500 mg via INTRAVENOUS
  Filled 2022-11-13: qty 5

## 2022-11-13 MED ORDER — ALBUTEROL SULFATE (2.5 MG/3ML) 0.083% IN NEBU
5.0000 mg | INHALATION_SOLUTION | Freq: Once | RESPIRATORY_TRACT | Status: AC
  Administered 2022-11-13: 5 mg via RESPIRATORY_TRACT
  Filled 2022-11-13: qty 6

## 2022-11-13 MED ORDER — SODIUM CHLORIDE 0.9 % IV SOLN: INTRAVENOUS | Status: DC

## 2022-11-13 NOTE — ED Notes (Signed)
.  edh

## 2022-11-13 NOTE — ED Notes (Signed)
Patient requesting something for HA and for cough.  New orders per Sherrill Raring, PA.

## 2022-11-13 NOTE — ED Notes (Signed)
Pt given a ginger ale per request.

## 2022-11-13 NOTE — ED Notes (Signed)
ED TO INPATIENT HANDOFF REPORT  ED Nurse Name and Phone #: Dereck Leep [phone]  S Name/Age/Gender Melinda [name] 67 y.o. female Room/Bed: MH06/MH06  Code Status   Code Status: Not on file  Home/SNF/Other Home Patient oriented to: self, place, time, and situation Is this baseline? Yes   Triage Complete: Triage complete  Chief Complaint Acute respiratory failure with hypoxia ([place]) [J96.01]  Triage Note Dx with Flu A on 11/09/22, reports "I haven't gotten better. I would like for the doctor to put me on an IV so I can get some fluids. I can't stop coughing. My ribs are hurting so bad. My left eye has pressure and I keep dizzy, and have a numbness down to my chin." She states she needs an upper GI but hasn't gotten called by GI docs yet. She states she has trouble eating due to it "burning all the way down" x 1 month. When asked  what is different since the last time she was in the ED pt states "nothing has changed, its just not better". She reports vomiting over night the last two days. She states she tries to sip on drinks but "it doesn't work". Reports continued fever, states she's taking Nyquil, Robitussin, and Tylenol cold for her sx.    Allergies Allergies  Allergen Reactions   Aspirin Anaphylaxis   Succinylcholine Anaphylaxis    coma   Tramadol Itching   Tizanidine Anxiety    Level of Care/Admitting Diagnosis ED Disposition     ED Disposition  Admit   Condition  --   Comment  Hospital Area: [place] [[id number]]  Level of Care: Telemetry [5]  Admit to tele based on following criteria: Monitor QTC interval  Interfacility transfer: Yes  May place patient in observation at [facility] or [facility] if equivalent level of care is available:: Yes  Covid Evaluation: Asymptomatic - no recent exposure (last 10 days) testing not required  Diagnosis: Acute respiratory failure with hypoxia [facility]) [[id number]]  Admitting Physician: Vianne Bulls  [[id number]]  Attending Physician: Vianne Bulls [[id number]]          B Medical/Surgery History Past Medical History:  Diagnosis Date   Reflux    Sprain of neck 11/24/2013   Past Surgical History:  Procedure Laterality Date   ABDOMINAL HYSTERECTOMY     BREAST BIOPSY Right 02/03/2013   stereotactic biopsy negative   BREAST BIOPSY Left 2006   negative   BREAST BIOPSY Left 07/14/2009   negative with clip   BREAST CYST ASPIRATION Left 2006   negative   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   CERVICAL FUSION     CESAREAN SECTION     ESOPHAGEAL DILATION       A IV Location/Drains/Wounds Patient Lines/Drains/Airways Status     Active Line/Drains/Airways     Name Placement date Placement time Site Days   Peripheral IV 11/17/2022 20 G Right Antecubital 11/04/2022  0525  Antecubital  less than 1            Intake/Output Last 24 hours No intake or output data in the 24 hours ending 11/12/2022 1704  Labs/Imaging Results for orders placed or performed during the hospital encounter of 10/30/2022 (from the past 48 hour(s))  CBC with Differential     Status: None   Collection Time: 11/27/2022  5:34 AM  Result Value Ref Range   WBC 9.2 4.0 - 10.5 K/uL   RBC 4.72 3.87 - 5.11 MIL/uL   Hemoglobin 14.2  12.0 - 15.0 g/dL   HCT 09.2 33.0 - 07.6 %   MCV 88.8 80.0 - 100.0 fL   MCH 30.1 26.0 - 34.0 pg   MCHC 33.9 30.0 - 36.0 g/dL   RDW 22.6 33.3 - 54.5 %   Platelets 174 150 - 400 K/uL   nRBC 0.0 0.0 - 0.2 %   Neutrophils Relative % 84 %   Neutro Abs 7.6 1.7 - 7.7 K/uL   Lymphocytes Relative 10 %   Lymphs Abs 0.9 0.7 - 4.0 K/uL   Monocytes Relative 6 %   Monocytes Absolute 0.6 0.1 - 1.0 K/uL   Eosinophils Relative 0 %   Eosinophils Absolute 0.0 0.0 - 0.5 K/uL   Basophils Relative 0 %   Basophils Absolute 0.0 0.0 - 0.1 K/uL   Immature Granulocytes 0 %   Abs Immature Granulocytes 0.03 0.00 - 0.07 K/uL    Comment: Performed at [facility], [address] [place], [place] [address]   Comprehensive metabolic panel     Status: Abnormal   Collection Time: 11-24-2022  5:34 AM  Result Value Ref Range   Sodium 132 (L) 135 - 145 mmol/L   Potassium 4.0 3.5 - 5.1 mmol/L   Chloride 95 (L) 98 - 111 mmol/L   CO2 26 22 - 32 mmol/L   Glucose, Bld 135 (H) 70 - 99 mg/dL    Comment: Glucose reference range applies only to samples taken after fasting for at least 8 hours.   BUN 13 8 - 23 mg/dL   Creatinine, Ser 8.93 0.44 - 1.00 mg/dL   Calcium 8.4 (L) 8.9 - 10.3 mg/dL   Total Protein 7.3 6.5 - 8.1 g/dL   Albumin 2.9 (L) 3.5 - 5.0 g/dL   AST 89 (H) 15 - 41 U/L   ALT 53 (H) 0 - 44 U/L   Alkaline Phosphatase 97 38 - 126 U/L   Total Bilirubin 0.6 0.3 - 1.2 mg/dL   GFR, Estimated >73 >42 mL/min    Comment: (NOTE) Calculated using the CKD-EPI Creatinine Equation (2021)    Anion gap 11 5 - 15    Comment: Performed at [facility], [address] [place], [place] [address]  Resp panel by RT-PCR (RSV, Flu A&B, Covid) Nasopharyngeal Swab     Status: Abnormal   Collection Time: 2022/11/24  6:19 AM   Specimen: Nasopharyngeal Swab; Nasal Swab  Result Value Ref Range   SARS Coronavirus 2 by RT PCR NEGATIVE NEGATIVE    Comment: (NOTE) SARS-CoV-2 target nucleic acids are NOT DETECTED.  The SARS-CoV-2 RNA is generally detectable in upper respiratory specimens during the acute phase of infection. The lowest concentration of SARS-CoV-2 viral copies this assay can detect is 138 copies/mL. A negative result does not preclude SARS-Cov-2 infection and should not be used as the sole basis for treatment or other patient management decisions. A negative result may occur with  improper specimen collection/handling, submission of specimen other than nasopharyngeal swab, presence of viral mutation(s) within the areas targeted by this assay, and inadequate number of viral copies(<138 copies/mL). A negative result must be combined with clinical observations, patient history, and  epidemiological information. The expected result is Negative.  Fact Sheet for Patients:  BloggerCourse.com  Fact Sheet for Healthcare Providers:  SeriousBroker.it  This test is no t yet approved or cleared by the [place] FDA and  has been authorized for detection and/or diagnosis of SARS-CoV-2 by FDA under an Emergency Use Authorization (  EUA). This EUA will remain  in effect (meaning this test can be used) for the duration of the COVID-19 declaration under Section 564(b)(1) of the Act, 21 U.S.C.section 360bbb-3(b)(1), unless the authorization is terminated  or revoked sooner.       Influenza A by PCR POSITIVE (A) NEGATIVE   Influenza B by PCR NEGATIVE NEGATIVE    Comment: (NOTE) The Xpert Xpress SARS-CoV-2/FLU/RSV plus assay is intended as an aid in the diagnosis of influenza from Nasopharyngeal swab specimens and should not be used as a sole basis for treatment. Nasal washings and aspirates are unacceptable for Xpert Xpress SARS-CoV-2/FLU/RSV testing.  Fact Sheet for Patients: EntrepreneurPulse.com.au  Fact Sheet for Healthcare Providers: IncredibleEmployment.be  This test is not yet approved or cleared by the [place] FDA and has been authorized for detection and/or diagnosis of SARS-CoV-2 by FDA under an Emergency Use Authorization (EUA). This EUA will remain in effect (meaning this test can be used) for the duration of the COVID-19 declaration under Section 564(b)(1) of the Act, 21 U.S.C. section 360bbb-3(b)(1), unless the authorization is terminated or revoked.     Resp Syncytial Virus by PCR NEGATIVE NEGATIVE    Comment: (NOTE) Fact Sheet for Patients: EntrepreneurPulse.com.au  Fact Sheet for Healthcare Providers: IncredibleEmployment.be  This test is not yet approved or cleared by the [place] FDA and has been authorized for  detection and/or diagnosis of SARS-CoV-2 by FDA under an Emergency Use Authorization (EUA). This EUA will remain in effect (meaning this test can be used) for the duration of the COVID-19 declaration under Section 564(b)(1) of the Act, 21 U.S.C. section 360bbb-3(b)(1), unless the authorization is terminated or revoked.  Performed at [facility], [address] [place], [place] [address]    DG Chest Portable 1 View  Result Date: 11/12/2022 CLINICAL DATA:  Flu.  Hypoxia.  Coughing and rib pain. EXAM: PORTABLE CHEST 1 VIEW COMPARISON:  11/09/2022 FINDINGS: Stable cardiomediastinal contours. Marked central airway thickening. No pleural effusion. Interval development multifocal, bilateral airspace and interstitial opacities. IMPRESSION: Interval development of multifocal bilateral airspace and interstitial opacities compatible with multifocal infection. Marked central airway thickening. Electronically Signed   By: Kerby Moors M.D.   On: 11/09/2022 06:42    Pending Labs Unresulted Labs (From admission, onward)     Start     Ordered   11/02/2022 0507  Urinalysis, Routine w reflex microscopic Urine, Clean Catch  Once,   URGENT       Question:  Release to patient  Answer:  Immediate   11/03/2022 0506            Vitals/Pain Today's Vitals   11/10/2022 1535 11/21/2022 1541 11/23/2022 1624 11/17/2022 1630  BP:    95/81  Pulse:    81  Resp:    (!) 22  Temp:  98.7 F (37.1 C)    TempSrc:  Oral    SpO2:   94% 96%  Weight:      Height:      PainSc: 7        Isolation Precautions No active isolations  Medications Medications  ipratropium-albuterol (DUONEB) 0.5-2.5 (3) MG/3ML nebulizer solution 3 mL (3 mLs Nebulization Given 11/12/2022 1624)  albuterol (PROVENTIL) (2.5 MG/3ML) 0.083% nebulizer solution 2.5 mg (has no administration in time range)  sodium chloride 0.9 % bolus 500 mL (0 mLs Intravenous Stopped 11/12/2022 0735)  ipratropium-albuterol (DUONEB) 0.5-2.5 (3) MG/3ML nebulizer  solution 3 mL (3 mLs Nebulization Given 11/28/2022 0515)  ondansetron (ZOFRAN) injection  4 mg (4 mg Intravenous Given 11/21/2022 0538)  albuterol (PROVENTIL) (2.5 MG/3ML) 0.083% nebulizer solution 5 mg (5 mg Nebulization Given 11/12/2022 0555)  cefTRIAXone (ROCEPHIN) 1 g in sodium chloride 0.9 % 100 mL IVPB (0 g Intravenous Stopped 11/24/2022 0714)  azithromycin (ZITHROMAX) 500 mg in sodium chloride 0.9 % 250 mL IVPB (0 mg Intravenous Stopped 11/17/2022 0911)  benzonatate (TESSALON) capsule 100 mg (100 mg Oral Given 11/03/2022 0754)  acetaminophen (TYLENOL) tablet 650 mg (650 mg Oral Given 11/27/2022 1055)  benzonatate (TESSALON) capsule 100 mg (100 mg Oral Given 11/17/2022 1541)  acetaminophen (TYLENOL) tablet 650 mg (650 mg Oral Given 11/26/2022 1541)    Mobility walks with person assist Low fall risk   Focused Assessments Pulmonary Assessment Handoff:  Lung sounds: Bilateral Breath Sounds: Rhonchi O2 Device: Nasal Cannula O2 Flow Rate (L/min): 3 L/min    R Recommendations: See Admitting Provider Note  Report given to:   Additional Notes:

## 2022-11-13 NOTE — ED Provider Notes (Signed)
[name] EMERGENCY DEPARTMENT Provider Note   CSN: [id number] Arrival date & time: 2022-11-29  0449     History  Chief Complaint  Patient presents with   Shortness of Breath    Melinda [name] is a 67 y.o. female.  The history is provided by the patient.  Shortness of Breath Severity:  Severe Onset quality:  Gradual Timing:  Constant Progression:  Worsening Context: URI   Relieved by:  Nothing Worsened by:  Nothing Ineffective treatments:  Inhaler Associated symptoms: fever, vomiting and wheezing   Associated symptoms comment:  Vomiting and diarrhea 4 each  Risk factors: tobacco use   Risk factors comment:  Just quit smoking Patient with COPD who recently quit smoking who presents with nausea and emesis and diarrhea and was recently diagnosed with flu.       Home Medications Prior to Admission medications   Medication Sig Start Date End Date Taking? Authorizing Provider  benzonatate (TESSALON) 100 MG capsule Take 1 capsule (100 mg total) by mouth every 8 (eight) hours. 07/18/20   Carlisle Cater, PA-C  cephALEXin (KEFLEX) 500 MG capsule Take 2 capsules (1,000 mg total) by mouth 2 (two) times daily. 09/02/22   Azucena Cecil, PA-C  dicyclomine (BENTYL) 20 MG tablet Take 1 tablet (20 mg total) by mouth 2 (two) times daily. 01/18/21   Tedd Sias, PA  methylPREDNISolone (MEDROL DOSEPAK) 4 MG TBPK tablet 6 day dose pack - take as directed 06/11/20   Criselda Peaches, DPM  omeprazole (PRILOSEC) 20 MG capsule Take 1 capsule (20 mg total) by mouth daily. 01/18/21   Tedd Sias, PA  ondansetron (ZOFRAN ODT) 4 MG disintegrating tablet Take 1 tablet (4 mg total) by mouth every 8 (eight) hours as needed for nausea or vomiting. 01/18/21   Tedd Sias, PA      Allergies    Aspirin, Succinylcholine, Tramadol, and Tizanidine    Review of Systems   Review of Systems  Unable to perform ROS: Acuity of condition  Constitutional:  Positive for fever.  HENT:   Negative for trouble swallowing.   Respiratory:  Positive for shortness of breath and wheezing.   Gastrointestinal:  Positive for diarrhea, nausea and vomiting.    Physical Exam Updated Vital Signs BP 120/71 (BP Location: Right Arm)   Pulse 86   Temp 99.3 F (37.4 C) (Oral)   Resp (!) 22   Ht 5\' 6"  (1.676 m)   Wt 70.4 kg   SpO2 95%   BMI 25.05 kg/m  Physical Exam Vitals and nursing note reviewed. Exam conducted with a chaperone present.  Constitutional:      General: She is not in acute distress.    Appearance: Normal appearance. She is well-developed. She is ill-appearing.  HENT:     Head: Normocephalic and atraumatic.     Nose: Nose normal.  Eyes:     Pupils: Pupils are equal, round, and reactive to light.  Cardiovascular:     Rate and Rhythm: Normal rate and regular rhythm.     Pulses: Normal pulses.     Heart sounds: Normal heart sounds.  Pulmonary:     Effort: Pulmonary effort is normal. Tachypnea present. No respiratory distress.     Breath sounds: Decreased air movement present. Wheezing and rhonchi present.  Abdominal:     General: Bowel sounds are normal. There is no distension.     Palpations: Abdomen is soft.     Tenderness: There is no abdominal tenderness. There  is no guarding or rebound.  Genitourinary:    Vagina: No vaginal discharge.  Musculoskeletal:        General: Normal range of motion.     Cervical back: Neck supple.  Skin:    General: Skin is warm and dry.     Capillary Refill: Capillary refill takes less than 2 seconds.     Findings: No erythema or rash.  Neurological:     General: No focal deficit present.     Mental Status: She is alert.     Deep Tendon Reflexes: Reflexes normal.  Psychiatric:        Mood and Affect: Mood normal.     ED Results / Procedures / Treatments   Labs (all labs ordered are listed, but only abnormal results are displayed) Results for orders placed or performed during the hospital encounter of 11/19/2022  CBC  with Differential  Result Value Ref Range   WBC 9.2 4.0 - 10.5 K/uL   RBC 4.72 3.87 - 5.11 MIL/uL   Hemoglobin 14.2 12.0 - 15.0 g/dL   HCT 41.9 36.0 - 46.0 %   MCV 88.8 80.0 - 100.0 fL   MCH 30.1 26.0 - 34.0 pg   MCHC 33.9 30.0 - 36.0 g/dL   RDW 13.6 11.5 - 15.5 %   Platelets 174 150 - 400 K/uL   nRBC 0.0 0.0 - 0.2 %   Neutrophils Relative % 84 %   Neutro Abs 7.6 1.7 - 7.7 K/uL   Lymphocytes Relative 10 %   Lymphs Abs 0.9 0.7 - 4.0 K/uL   Monocytes Relative 6 %   Monocytes Absolute 0.6 0.1 - 1.0 K/uL   Eosinophils Relative 0 %   Eosinophils Absolute 0.0 0.0 - 0.5 K/uL   Basophils Relative 0 %   Basophils Absolute 0.0 0.0 - 0.1 K/uL   Immature Granulocytes 0 %   Abs Immature Granulocytes 0.03 0.00 - 0.07 K/uL  Comprehensive metabolic panel  Result Value Ref Range   Sodium 132 (L) 135 - 145 mmol/L   Potassium 4.0 3.5 - 5.1 mmol/L   Chloride 95 (L) 98 - 111 mmol/L   CO2 26 22 - 32 mmol/L   Glucose, Bld 135 (H) 70 - 99 mg/dL   BUN 13 8 - 23 mg/dL   Creatinine, Ser 0.88 0.44 - 1.00 mg/dL   Calcium 8.4 (L) 8.9 - 10.3 mg/dL   Total Protein 7.3 6.5 - 8.1 g/dL   Albumin 2.9 (L) 3.5 - 5.0 g/dL   AST 89 (H) 15 - 41 U/L   ALT 53 (H) 0 - 44 U/L   Alkaline Phosphatase 97 38 - 126 U/L   Total Bilirubin 0.6 0.3 - 1.2 mg/dL   GFR, Estimated >60 >60 mL/min   Anion gap 11 5 - 15   DG Tibia/Fibula Right  Result Date: 11/09/2022 CLINICAL DATA:  67 year old female with fall, pain. EXAM: RIGHT TIBIA AND FIBULA - 2 VIEW COMPARISON:  Right tib fib series 08/19/2017. FINDINGS: Bone mineralization is within normal limits. Maintained alignment at the right knee and ankle. Calcaneus degenerative spurring. No knee or ankle joint effusion is evident. No acute osseous abnormality identified. No discrete soft tissue injury. IMPRESSION: No acute fracture or dislocation identified about the right tib fib. Electronically Signed   By: Genevie Ann M.D.   On: 11/09/2022 09:15   DG Chest 2 View  Result Date:  11/09/2022 CLINICAL DATA:  Fall.  Cough. EXAM: CHEST - 2 VIEW COMPARISON:  CXR 08/19/17 FINDINGS: No  pleural effusion. No pneumothorax. Normal cardiac and mediastinal contours. Partially visualized cervical spinal fusion hardware in place. Compared to prior exam there is interval increase in prominence of bilateral interstitial opacities, which can be seen in the setting of atypical infection. Vertebral body heights are maintained. No displaced rib fractures. IMPRESSION: 1. Interval increase in prominence of bilateral interstitial opacities, which can be seen in the setting of atypical infection. 2. No displaced rib fractures. No pneumothorax. Electronically Signed   By: Lorenza Cambridge M.D.   On: 11/09/2022 08:08    EKG EKG Interpretation  Date/Time:  Monday November 13 2022 05:16:32 EST Ventricular Rate:  84 PR Interval:  140 QRS Duration: 72 QT Interval:  349 QTC Calculation: 413 R Axis:   69 Text Interpretation: Sinus rhythm Consider left ventricular hypertrophy Confirmed by Dorsey Authement ([id number]) on 11/16/2022 5:49:30 AM  Radiology No results found.  Procedures Procedures    Medications Ordered in ED Medications  albuterol (PROVENTIL) (2.5 MG/3ML) 0.083% nebulizer solution (has no administration in time range)  cefTRIAXone (ROCEPHIN) 1 g in sodium chloride 0.9 % 100 mL IVPB (has no administration in time range)  azithromycin (ZITHROMAX) 500 mg in sodium chloride 0.9 % 250 mL IVPB (has no administration in time range)  sodium chloride 0.9 % bolus 500 mL (500 mLs Intravenous New Bag/Given 11/12/2022 0531)  ipratropium-albuterol (DUONEB) 0.5-2.5 (3) MG/3ML nebulizer solution 3 mL (3 mLs Nebulization Given 11/24/2022 0515)  ondansetron (ZOFRAN) injection 4 mg (4 mg Intravenous Given 11/02/2022 0538)  albuterol (PROVENTIL) (2.5 MG/3ML) 0.083% nebulizer solution 5 mg (5 mg Nebulization Given 11/15/2022 0555)    ED Course/ Medical Decision Making/ A&P                             Medical Decision  Making Patient diagnosed with Flu with SOB and wheezing and n/v/d   Amount and/or Complexity of Data Reviewed External Data Reviewed: labs, radiology and notes.    Details: Previous notes and labs and imaging reviewed  Labs: ordered.    Details: All labs reviewed:  sodium low 132, normal potassium 4.0, normal creatinine .88, normal LFTs, white count normal 9.2, normal hemoglobin 14.2 Normal platelet count Radiology: ordered and independent interpretation performed.    Details: B PNA by me on CXR  Risk Prescription drug management. Decision regarding hospitalization.    Final Clinical Impression(s) / ED Diagnoses Final diagnoses:  Chronic obstructive pulmonary disease, unspecified COPD type (HCC)  Influenza A  Vomiting and diarrhea  Pneumonia due to infectious organism, unspecified laterality, unspecified part of lung   The patient appears reasonably stabilized for admission considering the current resources, flow, and capabilities available in the ED at this time, and I doubt any other [facility] requiring further screening and/or treatment in the ED prior to admission.  Rx / DC Orders ED Discharge Orders     None         Jamespaul Secrist, MD 11/22/2022 [id number]

## 2022-11-13 NOTE — ED Notes (Signed)
Report given to [facility] RN.  

## 2022-11-13 NOTE — H&P (Signed)
History and Physical    Patient: Melinda [name] DOB: 09-Apr-1956 DOA: 11/21/2022 DOS: the patient was seen and examined on 11/07/2022 PCP: Pcp, No  Patient coming from: {Point_of_Origin:26777}  Chief Complaint:  Chief Complaint  Patient presents with   Shortness of Breath   HPI: Melinda [name] is a 67 y.o. 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 Her smoking use included cigarettes. She has never used smokeless tobacco. She reports that she does not drink alcohol and does not use drugs.  Allergies  Allergen Reactions   Aspirin Anaphylaxis   Succinylcholine Anaphylaxis    coma   Tramadol Itching   Tizanidine Anxiety    Family History  Problem Relation Age of Onset   Diabetes Father    Breast cancer Maternal Aunt    Breast cancer Maternal Grandmother    Breast cancer Cousin    Breast cancer Cousin     Prior to Admission medications   Medication Sig Start Date End Date Taking? Authorizing Provider  benzonatate (TESSALON) 100 MG capsule Take 1 capsule (100 mg total) by mouth every 8 (eight) hours. 07/18/20   Carlisle Cater, PA-C  cephALEXin (KEFLEX) 500 MG capsule Take 2 capsules (1,000 mg total) by mouth 2 (two) times daily. 09/02/22   Azucena Cecil, PA-C  dicyclomine (BENTYL) 20 MG tablet Take 1 tablet (20 mg total) by mouth 2 (two) times daily. 01/18/21   Tedd Sias, PA  methylPREDNISolone (MEDROL  DOSEPAK) 4 MG TBPK tablet 6 day dose pack - take as directed 06/11/20   Criselda Peaches, DPM  omeprazole (PRILOSEC) 20 MG capsule Take 1 capsule (20 mg total) by mouth daily. 01/18/21   Tedd Sias, PA  ondansetron (ZOFRAN ODT) 4 MG disintegrating tablet Take 1 tablet (4 mg total) by mouth every 8 (eight) hours as needed for nausea or vomiting. 01/18/21   Tedd Sias, PA    Physical Exam: Vitals:   11/01/2022 1541 11/12/2022 1624 11/09/2022 1630 10/31/2022 1900  BP:   95/81 132/71  Pulse:   81 85  Resp:   (!) 22 20  Temp: 98.7 F (37.1 C)   98.5 F (36.9 C)  TempSrc: Oral   Oral  SpO2:  94% 96% 98%  Weight:      Height:       *** Data Reviewed: {Tip this will not be part of the note when signed- Document your independent interpretation of telemetry tracing, EKG, lab, Radiology test or any other diagnostic tests. Add any new diagnostic test ordered today. (Optional):26781} {Results:26384}  Assessment and Plan: No notes have been filed under this hospital service. Service: Hospitalist     Advance Care Planning:   Code Status: Full Code ***  Consults: ***  Family Communication: ***  Severity of Illness: {Observation/Inpatient:21159}  AuthorBarbette Merino, MD 11/22/2022 7:36 PM  For on call review [email or web address].

## 2022-11-13 NOTE — Progress Notes (Signed)
Plan of Care Note for accepted transfer   Patient: Melinda [name] MRN: [id number]   [place]: 12/03/22  Facility requesting transfer: [facility]   Requesting Provider: Dr. Randal Buba   Reason for transfer: Acute hypoxic respiratory failure   Facility course: 67 yr old female smoker who was diagnosed with flu A on 1/11 after 2 days of symptoms presents with persistent cough and posttussive vomiting. She is afebrile in ED with RR 22 and O2 saturation mid-90s on 2 Lpm supplemental O2. CBC is unremarkable and CMP notable for AST 89 and AST 53. There are increased opacities bilaterally on CXR which has not yet been read. COVID/influenza/RSV pcr is pending. She was treated with nebs, Rocephin, and azithromycin.    Plan of care: The patient is accepted for admission to Telemetry unit, at [facility].   Author: Vianne Bulls, MD Dec 03, 2022  Check [email or web address] for on-call coverage.  Nursing staff, Please call [place] number on Amion as soon as patient's arrival, so appropriate admitting provider can evaluate the pt.

## 2022-11-13 NOTE — ED Triage Notes (Signed)
Dx with Flu A on 11/09/22, reports "I haven't gotten better. I would like for the doctor to put me on an IV so I can get some fluids. I can't stop coughing. My ribs are hurting so bad. My left eye has pressure and I keep dizzy, and have a numbness down to my chin." She states she needs an upper GI but hasn't gotten called by GI docs yet. She states she has trouble eating due to it "burning all the way down" x 1 month. When asked  what is different since the last time she was in the ED pt states "nothing has changed, its just not better". She reports vomiting over night the last two days. She states she tries to sip on drinks but "it doesn't work". Reports continued fever, states she's taking Nyquil, Robitussin, and Tylenol cold for her sx.

## 2022-11-14 DIAGNOSIS — G9349 Other encephalopathy: Secondary | ICD-10-CM | POA: Diagnosis not present

## 2022-11-14 DIAGNOSIS — G935 Compression of brain: Secondary | ICD-10-CM | POA: Diagnosis not present

## 2022-11-14 DIAGNOSIS — I7772 Dissection of iliac artery: Secondary | ICD-10-CM | POA: Diagnosis present

## 2022-11-14 DIAGNOSIS — I6521 Occlusion and stenosis of right carotid artery: Secondary | ICD-10-CM | POA: Diagnosis not present

## 2022-11-14 DIAGNOSIS — G8194 Hemiplegia, unspecified affecting left nondominant side: Secondary | ICD-10-CM | POA: Diagnosis not present

## 2022-11-14 DIAGNOSIS — G936 Cerebral edema: Secondary | ICD-10-CM | POA: Diagnosis not present

## 2022-11-14 DIAGNOSIS — I63411 Cerebral infarction due to embolism of right middle cerebral artery: Secondary | ICD-10-CM | POA: Diagnosis not present

## 2022-11-14 DIAGNOSIS — I639 Cerebral infarction, unspecified: Secondary | ICD-10-CM | POA: Diagnosis not present

## 2022-11-14 DIAGNOSIS — I6601 Occlusion and stenosis of right middle cerebral artery: Secondary | ICD-10-CM | POA: Diagnosis not present

## 2022-11-14 DIAGNOSIS — R4701 Aphasia: Secondary | ICD-10-CM | POA: Diagnosis not present

## 2022-11-14 DIAGNOSIS — I63531 Cerebral infarction due to unspecified occlusion or stenosis of right posterior cerebral artery: Secondary | ICD-10-CM | POA: Diagnosis present

## 2022-11-14 DIAGNOSIS — J1008 Influenza due to other identified influenza virus with other specified pneumonia: Secondary | ICD-10-CM | POA: Diagnosis present

## 2022-11-14 DIAGNOSIS — E871 Hypo-osmolality and hyponatremia: Secondary | ICD-10-CM | POA: Diagnosis present

## 2022-11-14 DIAGNOSIS — E785 Hyperlipidemia, unspecified: Secondary | ICD-10-CM | POA: Diagnosis present

## 2022-11-14 DIAGNOSIS — E870 Hyperosmolality and hypernatremia: Secondary | ICD-10-CM | POA: Diagnosis not present

## 2022-11-14 DIAGNOSIS — J159 Unspecified bacterial pneumonia: Secondary | ICD-10-CM | POA: Diagnosis present

## 2022-11-14 DIAGNOSIS — I10 Essential (primary) hypertension: Secondary | ICD-10-CM | POA: Diagnosis present

## 2022-11-14 DIAGNOSIS — F32A Depression, unspecified: Secondary | ICD-10-CM | POA: Diagnosis present

## 2022-11-14 DIAGNOSIS — J440 Chronic obstructive pulmonary disease with acute lower respiratory infection: Secondary | ICD-10-CM | POA: Diagnosis present

## 2022-11-14 DIAGNOSIS — Z1152 Encounter for screening for COVID-19: Secondary | ICD-10-CM | POA: Diagnosis not present

## 2022-11-14 DIAGNOSIS — I161 Hypertensive emergency: Secondary | ICD-10-CM | POA: Diagnosis not present

## 2022-11-14 DIAGNOSIS — J111 Influenza due to unidentified influenza virus with other respiratory manifestations: Secondary | ICD-10-CM | POA: Diagnosis present

## 2022-11-14 DIAGNOSIS — I63511 Cerebral infarction due to unspecified occlusion or stenosis of right middle cerebral artery: Secondary | ICD-10-CM | POA: Diagnosis not present

## 2022-11-14 DIAGNOSIS — F1010 Alcohol abuse, uncomplicated: Secondary | ICD-10-CM | POA: Diagnosis present

## 2022-11-14 DIAGNOSIS — Z66 Do not resuscitate: Secondary | ICD-10-CM | POA: Diagnosis not present

## 2022-11-14 DIAGNOSIS — J441 Chronic obstructive pulmonary disease with (acute) exacerbation: Secondary | ICD-10-CM | POA: Diagnosis present

## 2022-11-14 DIAGNOSIS — J9601 Acute respiratory failure with hypoxia: Secondary | ICD-10-CM | POA: Diagnosis present

## 2022-11-14 DIAGNOSIS — I63541 Cerebral infarction due to unspecified occlusion or stenosis of right cerebellar artery: Secondary | ICD-10-CM | POA: Diagnosis present

## 2022-11-14 DIAGNOSIS — J101 Influenza due to other identified influenza virus with other respiratory manifestations: Secondary | ICD-10-CM | POA: Diagnosis present

## 2022-11-14 DIAGNOSIS — Z515 Encounter for palliative care: Secondary | ICD-10-CM | POA: Diagnosis not present

## 2022-11-14 LAB — COMPREHENSIVE METABOLIC PANEL
ALT: 48 U/L — ABNORMAL HIGH (ref 0–44)
AST: 56 U/L — ABNORMAL HIGH (ref 15–41)
Albumin: 2.2 g/dL — ABNORMAL LOW (ref 3.5–5.0)
Alkaline Phosphatase: 77 U/L (ref 38–126)
Anion gap: 7 (ref 5–15)
BUN: 9 mg/dL (ref 8–23)
CO2: 28 mmol/L (ref 22–32)
Calcium: 8.1 mg/dL — ABNORMAL LOW (ref 8.9–10.3)
Chloride: 103 mmol/L (ref 98–111)
Creatinine, Ser: 0.73 mg/dL (ref 0.44–1.00)
GFR, Estimated: >60 mL/min (ref >60)
Glucose, Bld: 104 mg/dL — ABNORMAL HIGH (ref 70–99)
Potassium: 4.3 mmol/L (ref 3.5–5.1)
Sodium: 138 mmol/L (ref 135–145)
Total Bilirubin: 0.7 mg/dL (ref 0.3–1.2)
Total Protein: 5.8 g/dL — ABNORMAL LOW (ref 6.5–8.1)

## 2022-11-14 LAB — CBC
HCT: 37.1 % (ref 36.0–46.0)
Hemoglobin: 12.2 g/dL (ref 12.0–15.0)
MCH: 30.2 pg (ref 26.0–34.0)
MCHC: 32.9 g/dL (ref 30.0–36.0)
MCV: 91.8 fL (ref 80.0–100.0)
Platelets: 174 10*3/uL (ref 150–400)
RBC: 4.04 MIL/uL (ref 3.87–5.11)
RDW: 14 % (ref 11.5–15.5)
WBC: 10.1 10*3/uL (ref 4.0–10.5)
nRBC: 0 % (ref 0.0–0.2)

## 2022-11-14 LAB — BRAIN NATRIURETIC PEPTIDE: B Natriuretic Peptide: 100.2 pg/mL — ABNORMAL HIGH (ref 0.0–100.0)

## 2022-11-14 LAB — PROCALCITONIN: Procalcitonin: 0.32 ng/mL

## 2022-11-14 MED ORDER — MORPHINE SULFATE (PF) 2 MG/ML IV SOLN
2.0000 mg | INTRAVENOUS | Status: DC | PRN
  Administered 2022-11-14 – 2022-11-15 (×8): 2 mg via INTRAVENOUS
  Filled 2022-11-14 (×8): qty 1

## 2022-11-14 MED ORDER — SUCRALFATE 1 GM/10ML PO SUSP
1.0000 g | Freq: Two times a day (BID) | ORAL | Status: DC
  Administered 2022-11-14 – 2022-11-15 (×3): 1 g via ORAL
  Filled 2022-11-14 (×5): qty 10

## 2022-11-14 MED ORDER — DEXTROMETHORPHAN POLISTIREX ER 30 MG/5ML PO SUER
30.0000 mg | Freq: Two times a day (BID) | ORAL | Status: DC
  Administered 2022-11-14 – 2022-11-15 (×3): 30 mg via ORAL
  Filled 2022-11-14 (×6): qty 5

## 2022-11-14 MED ORDER — MORPHINE SULFATE (PF) 2 MG/ML IV SOLN
1.0000 mg | Freq: Once | INTRAVENOUS | Status: AC
  Administered 2022-11-14: 1 mg via INTRAVENOUS
  Filled 2022-11-14: qty 1

## 2022-11-14 MED ORDER — METHYLPREDNISOLONE SODIUM SUCC 125 MG IJ SOLR
81.2500 mg | Freq: Two times a day (BID) | INTRAMUSCULAR | Status: DC
  Administered 2022-11-15: 81.25 mg via INTRAVENOUS
  Filled 2022-11-14: qty 2

## 2022-11-14 MED ORDER — GUAIFENESIN ER 600 MG PO TB12
600.0000 mg | ORAL_TABLET | Freq: Two times a day (BID) | ORAL | Status: DC
  Administered 2022-11-14 – 2022-11-15 (×3): 600 mg via ORAL
  Filled 2022-11-14 (×4): qty 1

## 2022-11-14 MED ORDER — METHYLPREDNISOLONE SODIUM SUCC 40 MG IJ SOLR
40.0000 mg | Freq: Once | INTRAMUSCULAR | Status: AC
  Administered 2022-11-14: 40 mg via INTRAVENOUS
  Filled 2022-11-14: qty 1

## 2022-11-14 MED ORDER — IPRATROPIUM-ALBUTEROL 0.5-2.5 (3) MG/3ML IN SOLN
3.0000 mL | Freq: Three times a day (TID) | RESPIRATORY_TRACT | Status: DC
  Administered 2022-11-14 – 2022-11-16 (×7): 3 mL via RESPIRATORY_TRACT
  Filled 2022-11-14 (×9): qty 3

## 2022-11-14 MED ORDER — METHYLPREDNISOLONE SODIUM SUCC 40 MG IJ SOLR
40.0000 mg | Freq: Every day | INTRAMUSCULAR | Status: DC
  Administered 2022-11-14: 40 mg via INTRAVENOUS
  Filled 2022-11-14: qty 1

## 2022-11-14 MED ORDER — BENZONATATE 100 MG PO CAPS
100.0000 mg | ORAL_CAPSULE | Freq: Three times a day (TID) | ORAL | Status: DC
  Administered 2022-11-14 – 2022-11-15 (×4): 100 mg via ORAL
  Filled 2022-11-14 (×5): qty 1

## 2022-11-14 NOTE — Hospital Course (Signed)
PMH of GERD, anxiety, smoking present to the hospital with complaints of cough and shortness of breath. 1/11 diagnosed with influenza A. 02-Dec-2022 presented to hospital with complaints of worsening cough and shortness of breath found to have bacterial pneumonia on top of influenza

## 2022-11-14 NOTE — Progress Notes (Signed)
[facility] Progress Note Patient: Melinda [name] WPV:[id number] DOB: [birth year] DOA: 10/30/2022  DOS: the patient was seen and examined on 11/14/2022  Brief hospital course: PMH of GERD, anxiety, smoking present to the hospital with complaints of cough and shortness of breath. 1/11 diagnosed with influenza A. 1/15 presented to hospital with complaints of worsening cough and shortness of breath found to have bacterial pneumonia on top of influenza Assessment and Plan: Acute hypoxic respiratory failure. Multifocal pneumonia.  Likely combination of influenza A as well as bacterial pneumonia. COPD exacerbation. Currently on Tamiflu for 5 days. Does not use any oxygen at her baseline. Currently needing 5 LPM. Pro-Cal 0.32. Mild leukocytosis currently resolved. Bilateral expiratory wheezing with severe respiratory distress. Will continue to treat with IV antibiotics, IV steroids, DuoNebs, cough suppressant medications. Monitor.  GERD. Continue PPI. Patient was actually supposed to see GI outpatient for odynophagia.  Will get Carafate.  Active smoker. Patient recently quit smoking 1 week ago due to illness. Nicotine patch with Patient willing to quit.   Subjective: No nausea no vomiting no fever no chills.  Have severe shortness of breath.  No chest pain.  Physical Exam: General: in moderate distress, No Rash Cardiovascular: S1 and S2 Present, No Murmur Respiratory: Increased respiratory effort, Bilateral Air entry present.  Bilateral crackles, bilateral wheezes Abdomen: Bowel Sound present, No tenderness Extremities: No edema Neuro: Alert and oriented x3, no new focal deficit  Data Reviewed: I have Reviewed nursing notes, Vitals, and Lab results. Since last encounter, pertinent lab results CBC and BMP   . I have ordered test including CBC and BMP and Pro-Cal  .   Disposition: Status is: Inpatient Remains inpatient appropriate because: Need IV steroids, IV antibiotics and  improvement in oxygenation  enoxaparin (LOVENOX) injection 40 mg Start: 11/29/2022 2200   Family Communication: No one at bedside Level of care: Telemetry Continue telemetry due to severe distress.  At risk for worsening. Vitals:   11/14/22 0906 11/14/22 0909 11/14/22 1326 11/14/22 1352  BP:    117/70  Pulse:    95  Resp:    20  Temp:    98.3 F (36.8 C)  TempSrc:      SpO2: (!) 88% 95% 90% 93%  Weight:      Height:         Author: Berle Mull, MD 11/14/2022 8:50 PM  Please look on [email or web address] to find out who is on call.

## 2022-11-15 ENCOUNTER — Inpatient Hospital Stay (HOSPITAL_COMMUNITY): Payer: 59 | Admitting: Certified Registered Nurse Anesthetist

## 2022-11-15 ENCOUNTER — Inpatient Hospital Stay (HOSPITAL_COMMUNITY): Payer: 59

## 2022-11-15 ENCOUNTER — Encounter (HOSPITAL_COMMUNITY): Admission: EM | Disposition: E | Payer: Self-pay | Source: Home / Self Care | Attending: Neurology

## 2022-11-15 DIAGNOSIS — G935 Compression of brain: Secondary | ICD-10-CM | POA: Diagnosis not present

## 2022-11-15 DIAGNOSIS — I6601 Occlusion and stenosis of right middle cerebral artery: Secondary | ICD-10-CM

## 2022-11-15 DIAGNOSIS — J9601 Acute respiratory failure with hypoxia: Secondary | ICD-10-CM | POA: Diagnosis not present

## 2022-11-15 DIAGNOSIS — I63511 Cerebral infarction due to unspecified occlusion or stenosis of right middle cerebral artery: Secondary | ICD-10-CM | POA: Diagnosis present

## 2022-11-15 DIAGNOSIS — Z66 Do not resuscitate: Secondary | ICD-10-CM | POA: Diagnosis not present

## 2022-11-15 DIAGNOSIS — Z515 Encounter for palliative care: Secondary | ICD-10-CM | POA: Diagnosis not present

## 2022-11-15 DIAGNOSIS — I6521 Occlusion and stenosis of right carotid artery: Secondary | ICD-10-CM | POA: Diagnosis not present

## 2022-11-15 DIAGNOSIS — J1008 Influenza due to other identified influenza virus with other specified pneumonia: Secondary | ICD-10-CM | POA: Diagnosis not present

## 2022-11-15 HISTORY — PX: RADIOLOGY WITH ANESTHESIA: SHX6223

## 2022-11-15 LAB — BLOOD GAS, ARTERIAL
Acid-Base Excess: 5.2 mmol/L — ABNORMAL HIGH (ref 0.0–2.0)
Bicarbonate: 29.9 mmol/L — ABNORMAL HIGH (ref 20.0–28.0)
Drawn by: 23281
O2 Saturation: 94.1 %
Patient temperature: 36.1
pCO2 arterial: 41 mmHg (ref 32–48)
pH, Arterial: 7.46 — ABNORMAL HIGH (ref 7.35–7.45)
pO2, Arterial: 62 mmHg — ABNORMAL LOW (ref 83–108)

## 2022-11-15 LAB — CBC
HCT: 36.5 % (ref 36.0–46.0)
Hemoglobin: 12.1 g/dL (ref 12.0–15.0)
MCH: 30.4 pg (ref 26.0–34.0)
MCHC: 33.2 g/dL (ref 30.0–36.0)
MCV: 91.7 fL (ref 80.0–100.0)
Platelets: 230 10*3/uL (ref 150–400)
RBC: 3.98 MIL/uL (ref 3.87–5.11)
RDW: 14.2 % (ref 11.5–15.5)
WBC: 13.8 10*3/uL — ABNORMAL HIGH (ref 4.0–10.5)
nRBC: 0 % (ref 0.0–0.2)

## 2022-11-15 LAB — BASIC METABOLIC PANEL
Anion gap: 11 (ref 5–15)
BUN: 6 mg/dL — ABNORMAL LOW (ref 8–23)
CO2: 26 mmol/L (ref 22–32)
Calcium: 8.3 mg/dL — ABNORMAL LOW (ref 8.9–10.3)
Chloride: 99 mmol/L (ref 98–111)
Creatinine, Ser: 0.58 mg/dL (ref 0.44–1.00)
GFR, Estimated: >60 mL/min (ref >60)
Glucose, Bld: 156 mg/dL — ABNORMAL HIGH (ref 70–99)
Potassium: 3.8 mmol/L (ref 3.5–5.1)
Sodium: 136 mmol/L (ref 135–145)

## 2022-11-15 LAB — PROCALCITONIN: Procalcitonin: 0.17 ng/mL

## 2022-11-15 LAB — MRSA NEXT GEN BY PCR, NASAL: MRSA by PCR Next Gen: NOT DETECTED

## 2022-11-15 LAB — GLUCOSE, CAPILLARY: Glucose-Capillary: 155 mg/dL — ABNORMAL HIGH (ref 70–99)

## 2022-11-15 LAB — MAGNESIUM: Magnesium: 2.3 mg/dL (ref 1.7–2.4)

## 2022-11-15 SURGERY — IR WITH ANESTHESIA
Anesthesia: General

## 2022-11-15 MED ORDER — PROPOFOL 500 MG/50ML IV EMUL
INTRAVENOUS | Status: DC | PRN
  Administered 2022-11-15: 75 ug/kg/min via INTRAVENOUS

## 2022-11-15 MED ORDER — SUCCINYLCHOLINE CHLORIDE 200 MG/10ML IV SOSY
PREFILLED_SYRINGE | INTRAVENOUS | Status: DC | PRN
  Administered 2022-11-15: 140 mg via INTRAVENOUS

## 2022-11-15 MED ORDER — IOHEXOL 300 MG/ML  SOLN
150.0000 mL | Freq: Once | INTRAMUSCULAR | Status: AC | PRN
  Administered 2022-11-16: 75 mL via INTRA_ARTERIAL

## 2022-11-15 MED ORDER — SODIUM CHLORIDE 0.9 % IV SOLN: INTRAVENOUS | Status: DC

## 2022-11-15 MED ORDER — NITROGLYCERIN 1 MG/10 ML FOR IR/CATH LAB
INTRA_ARTERIAL | Status: AC
  Administered 2022-11-15 (×4): 25 ug via INTRA_ARTERIAL
  Filled 2022-11-15: qty 10

## 2022-11-15 MED ORDER — ALPRAZOLAM 0.5 MG PO TABS
0.5000 mg | ORAL_TABLET | Freq: Two times a day (BID) | ORAL | Status: DC | PRN
  Administered 2022-11-15: 0.5 mg via ORAL
  Filled 2022-11-15: qty 1

## 2022-11-15 MED ORDER — PROPOFOL 10 MG/ML IV BOLUS
INTRAVENOUS | Status: DC | PRN
  Administered 2022-11-15: 150 mg via INTRAVENOUS

## 2022-11-15 MED ORDER — ONDANSETRON HCL 4 MG/2ML IJ SOLN: 4.0000 mg | Freq: Four times a day (QID) | INTRAMUSCULAR | Status: DC | PRN

## 2022-11-15 MED ORDER — IPRATROPIUM-ALBUTEROL 0.5-2.5 (3) MG/3ML IN SOLN: 3.0000 mL | RESPIRATORY_TRACT | Status: DC | PRN

## 2022-11-15 MED ORDER — NALOXONE HCL 0.4 MG/ML IJ SOLN
INTRAMUSCULAR | Status: AC
  Filled 2022-11-15: qty 1

## 2022-11-15 MED ORDER — PHENYLEPHRINE HCL (PRESSORS) 10 MG/ML IV SOLN
INTRAVENOUS | Status: DC | PRN
  Administered 2022-11-15: 40 ug via INTRAVENOUS
  Administered 2022-11-15 (×2): 80 ug via INTRAVENOUS

## 2022-11-15 MED ORDER — LIDOCAINE HCL (CARDIAC) PF 100 MG/5ML IV SOSY
PREFILLED_SYRINGE | INTRAVENOUS | Status: DC | PRN
  Administered 2022-11-15: 60 mg via INTRAVENOUS

## 2022-11-15 MED ORDER — CEFAZOLIN SODIUM-DEXTROSE 2-4 GM/100ML-% IV SOLN
INTRAVENOUS | Status: AC
  Filled 2022-11-15: qty 100

## 2022-11-15 MED ORDER — HYDRALAZINE HCL 20 MG/ML IJ SOLN: 10.0000 mg | INTRAMUSCULAR | Status: DC | PRN

## 2022-11-15 MED ORDER — CEFAZOLIN SODIUM-DEXTROSE 2-3 GM-%(50ML) IV SOLR
INTRAVENOUS | Status: DC | PRN
  Administered 2022-11-15: 2 g via INTRAVENOUS

## 2022-11-15 MED ORDER — METHYLPREDNISOLONE SODIUM SUCC 40 MG IJ SOLR
40.0000 mg | Freq: Two times a day (BID) | INTRAMUSCULAR | Status: DC
  Administered 2022-11-15 – 2022-11-17 (×4): 40 mg via INTRAVENOUS
  Filled 2022-11-15 (×4): qty 1

## 2022-11-15 MED ORDER — SENNOSIDES-DOCUSATE SODIUM 8.6-50 MG PO TABS: 1.0000 | ORAL_TABLET | Freq: Every evening | ORAL | Status: DC | PRN

## 2022-11-15 MED ORDER — ROCURONIUM BROMIDE 100 MG/10ML IV SOLN
INTRAVENOUS | Status: DC | PRN
  Administered 2022-11-15: 100 mg via INTRAVENOUS
  Administered 2022-11-16: 20 mg via INTRAVENOUS

## 2022-11-15 MED ORDER — CHLORHEXIDINE GLUCONATE CLOTH 2 % EX PADS
6.0000 | MEDICATED_PAD | Freq: Every day | CUTANEOUS | Status: DC
  Administered 2022-11-15: 6 via TOPICAL

## 2022-11-15 MED ORDER — PHENYLEPHRINE HCL-NACL 20-0.9 MG/250ML-% IV SOLN
INTRAVENOUS | Status: DC | PRN
  Administered 2022-11-15: 40 ug/min via INTRAVENOUS

## 2022-11-15 MED ORDER — NICOTINE 21 MG/24HR TD PT24
21.0000 mg | MEDICATED_PATCH | Freq: Every day | TRANSDERMAL | Status: DC
  Administered 2022-11-16 – 2022-11-17 (×2): 21 mg via TRANSDERMAL
  Filled 2022-11-15 (×3): qty 1

## 2022-11-15 MED ORDER — METOPROLOL TARTRATE 5 MG/5ML IV SOLN: 5.0000 mg | INTRAVENOUS | Status: DC | PRN

## 2022-11-15 MED ORDER — TRAZODONE HCL 50 MG PO TABS: 50.0000 mg | ORAL_TABLET | Freq: Every evening | ORAL | Status: DC | PRN

## 2022-11-15 MED ORDER — IOHEXOL 350 MG/ML SOLN
100.0000 mL | Freq: Once | INTRAVENOUS | Status: AC | PRN
  Administered 2022-11-15: 100 mL via INTRAVENOUS

## 2022-11-15 MED ORDER — NALOXONE HCL 0.4 MG/ML IJ SOLN
0.4000 mg | INTRAMUSCULAR | Status: DC | PRN
  Administered 2022-11-15: 0.2 mg via INTRAVENOUS

## 2022-11-15 MED ORDER — GUAIFENESIN 100 MG/5ML PO LIQD: 5.0000 mL | ORAL | Status: DC | PRN

## 2022-11-15 MED ORDER — LORAZEPAM 2 MG/ML IJ SOLN
1.0000 mg | INTRAMUSCULAR | Status: AC
  Administered 2022-11-15: 1 mg via INTRAVENOUS

## 2022-11-15 MED ORDER — STROKE: EARLY STAGES OF RECOVERY BOOK
Freq: Once | Status: AC
  Filled 2022-11-15 (×2): qty 1

## 2022-11-15 MED ORDER — LACTATED RINGERS IV SOLN: INTRAVENOUS | Status: DC | PRN

## 2022-11-15 NOTE — Significant Event (Signed)
Rapid Response Event Note   Reason for Call :  At started of shift, the staff nurse found patient with altered mental status and decreased level of consciousness and called rapid response.   Initial Focused Assessment:  Primary nurse reported that patient had complained at 1820 of left arm tingling/numbness. No interventions were done at that time for the left arm tingling. Patients last know well at this time was reported to be 1730 from daytime nurse. Upon assessment of the patient she had altered mental status, decreased level of consciousness, only responsive to painful stimuli on the left sided extremities with no resistance to gravity. Non-rebreather was in place at 15L. On call provider was called to bedside by the primary nurse. Noted morphine was given at 1500 in afternoon. Tele monitor was initiated at 1955 for [place].   Interventions:  Vitals at bedside, CBG was normal, EKG done, CODE STROKE order set initiated and on call provider at bedside. Administered 0.2 mg of narcan per provider, no response. Transported to CT with tele-neuro monitor in progress. Administered 1 mg ativan per order from provider for CTA. CXR preformed while in radiology. Remained with patient throughout diagnostic study until transfer to SD level care. Provider in communication with neurologist throughout event.   Plan of Care:  Orders from neurology to transfer to [facility], for Thrombectomy. Care-link present immediately and transferred care to [facility].   Event Summary:   MD Notified: Lester [place], NP Call Time: [phone] Arrival Time: 1939 End Time: 2158  Stevenson Clinch, RN

## 2022-11-15 NOTE — Progress Notes (Signed)
Mobility Specialist - Progress Note   11/24/2022 1324  Oxygen Therapy  SpO2 90 %  O2 Device Nasal Cannula  O2 Flow Rate (L/min) 5 L/min  Mobility  Activity Ambulated with assistance in hallway  Level of Assistance Modified independent, requires aide device or extra time  Assistive Device None  Distance Ambulated (ft) 60 ft  Activity Response Tolerated well  Mobility Referral Yes  $Mobility charge 1 Mobility   Nurse requested Mobility Specialist to perform oxygen saturation test with pt which includes removing pt from oxygen both at rest and while ambulating.  Below are the results from that testing.     Patient Saturations on Room Air at Rest = spO2 85%  Rested and performed pursed lip breathing for 1 minute with sp02 at 89%.  Patient Saturations on 5 Liters of oxygen while Ambulating = sp02 91%  At end of testing pt left in room on 5 Liters of oxygen.  Reported results to nurse.   Pt received in bed and agreed to mobility, felt winded upon standing. Continued with session and returned to chair with all needs met.   Roderick Pee Mobility Specialist

## 2022-11-15 NOTE — Progress Notes (Signed)
Pt transferred to [facility] at this time with Care[facility]. Report given to [facility] staff. Unable to contact [facility] at this time. Noted alterations in Neuro status as well as acute HTN of 200/100 at this time. [facility] NP on call, Raenette Rover aware of manual BP 180/100 obtained and no orders given. Lovenox held at this time. Only Solumedrol and NS infusion given per HS orders.

## 2022-11-15 NOTE — TOC Initial Note (Signed)
Transition of Care [facility]) - Initial/Assessment Note    Patient Details  Name: Melinda [name] MRN: [id number] Date of Birth: 01/07/1956  [facility] of [facility] CM/SW Contact:    Vassie Moselle, LCSW Phone Number: 11/08/2022, 12:28 PM  Clinical Narrative:        TOC following for possible home O2 need.             Expected Discharge Plan: Home/Self Care Barriers to Discharge: Continued Medical Work up   Patient Goals and CMS Choice            Expected Discharge Plan and Services In-house Referral: NA Discharge Planning Services: NA   Living arrangements for the past 2 months: Single Family Home                                      Prior Living Arrangements/Services Living arrangements for the past 2 months: Single Family Home   Patient language and need for interpreter reviewed:: Yes Do you feel safe going back to the place where you live?: Yes      Need for Family Participation in Patient Care: No (Comment) Care giver support system in place?: No (comment)   Criminal Activity/Legal Involvement Pertinent to Current Situation/Hospitalization: No - Comment as needed  Activities of Daily Living Home Assistive Devices/Equipment: None ADL Screening (condition at time of admission) Patient's cognitive ability adequate to safely complete daily activities?: No Is the patient deaf or have difficulty hearing?: No Does the patient have difficulty seeing, even when wearing glasses/contacts?: No Does the patient have difficulty concentrating, remembering, or making decisions?: No Patient able to express need for assistance with ADLs?: No Does the patient have difficulty dressing or bathing?: No Independently performs ADLs?: Yes (appropriate for developmental age) Does the patient have difficulty walking or climbing stairs?: No Weakness of Legs: None Weakness of Arms/Hands: None  Permission Sought/Granted   Permission granted to share information with :  No              Emotional Assessment   Attitude/Demeanor/Rapport: Unable to Assess Affect (typically observed): Unable to Assess   Alcohol / Substance Use: Not Applicable Psych Involvement: No (comment)  Admission diagnosis:  Influenza A [J10.1] Acute respiratory failure with hypoxia (HCC) [J96.01] Vomiting and diarrhea [R11.10, R19.7] Pneumonia due to infectious organism, unspecified laterality, unspecified part of lung [J18.9] Chronic obstructive pulmonary disease, unspecified COPD type ([place]) [J44.9] Influenza [J11.1] Patient Active Problem List   Diagnosis Date Noted   Influenza 11/14/2022   Acute respiratory failure with hypoxia ([place]) 11/06/2022   Influenza A with pneumonia 11/23/2022   Routine adult health maintenance 08/28/2018   Encounter for postoperative care 11/27/2017   Closed fracture of scaphoid bone of wrist 11/16/2017   Anxiety 08/13/2017   Cervical spondylosis with radiculopathy 08/13/2017   Depression 08/13/2017   H/O gastroesophageal reflux (GERD) 08/13/2017   Hyperlipidemia LDL goal <130 06/29/2017   Chronic pain syndrome 06/28/2017   History of alcohol abuse 06/28/2017   Risk for falls 05/11/2016   S/P cervical spinal fusion 02/10/2016   Right foot injury 02/11/2015   Right foot pain 02/11/2015   Sprain of neck 11/24/2013   Tobacco use disorder 03/30/2011   PCP:  Pcp, No Pharmacy:   [place], [place]. [place] [place] [address] Phone: [phone] Fax: [place] [phone] -  [place], [place] [address] [place] [address] Phone: [phone] Fax: [phone]     Social Determinants of Health (SDOH) Social History: [place]: No Food Insecurity (11/14/2022)  Housing: Low Risk  (11/14/2022)  Transportation Needs: No Transportation Needs (11/14/2022)  Utilities:  Not At Risk (11/14/2022)  Tobacco Use: Medium Risk (11/12/2022)   SDOH Interventions:     Readmission Risk Interventions    11-24-22   12:26 PM  Readmission Risk Prevention Plan  Post Dischage Appt Complete  Medication Screening Complete  Transportation Screening Complete

## 2022-11-15 NOTE — Anesthesia Procedure Notes (Signed)
Procedure Name: Intubation Date/Time: 11/22/2022 10:19 PM  Performed by: Verlaine Embry T, CRNAPre-anesthesia Checklist: Patient identified, Emergency Drugs available, Suction available and Patient being monitored Patient Re-evaluated:Patient Re-evaluated prior to induction Oxygen Delivery Method: Circle system utilized Preoxygenation: Pre-oxygenation with 100% oxygen Induction Type: IV induction, Rapid sequence and Cricoid Pressure applied Ventilation: Mask ventilation without difficulty Laryngoscope Size: Miller and 3 Grade View: Grade II Tube type: Oral Tube size: 7.5 mm Number of attempts: 1 Airway Equipment and Method: Stylet and Oral airway Placement Confirmation: ETT inserted through vocal cords under direct vision, positive ETCO2 and breath sounds checked- equal and bilateral Secured at: 22 cm Tube secured with: Tape Dental Injury: Teeth and Oropharynx as per pre-operative assessment

## 2022-11-15 NOTE — Progress Notes (Signed)
Patient arrived to unit, comfort afforded. Primary RN at bedside.

## 2022-11-15 NOTE — Plan of Care (Signed)

## 2022-11-15 NOTE — Procedures (Signed)
INR.  Status post right common carotid arteriogram.   Right CFA approach. Findings.   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 Status post revascularization of the right middle cerebral artery distribution with 2 passes with complex aspiration, and 1 pass with contact aspiration and use of a 3 mm x 20 mm Solitaire X retrieval device achieving aTICI 2C revascularization. Manual compression held at the right groin puncture site with quick clot for approximately 30 minutes for hemostasis due to failed 8 French Angio-Seal closure device.  Distal pulses. Non dopplerable RT DP  and posterior tibial pulse prior to the procedure.  Dopplerable DP and PT on the left unchanged. 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  Mild right perisylvian subarachnoid hyperattenuation.   Patient left intubated due to her medical accommodation. Arlean Hopping MD

## 2022-11-15 NOTE — Progress Notes (Signed)
PROGRESS NOTE    NADALYN DERINGER  TDV:[id number] DOB: 26-Feb-1956 DOA: 12/06/22 PCP: Pcp, No   Brief Narrative:  67 year old with history of GERD, anxiety, tobacco use comes to the hospital with shortness of breath.  She was diagnosed with influenza pneumonia in 1/11.  But due to worsening of symptoms she ended up coming to the hospital.  Upon arrival she was diagnosed with COPD exacerbation which is likely triggered by underlying influenza infection.   Assessment & Plan:  Principal Problem:   Acute respiratory failure with hypoxia (HCC) Active Problems:   Anxiety   Chronic pain syndrome   Depression   H/O gastroesophageal reflux (GERD)   History of alcohol abuse   Hyperlipidemia LDL goal <130   Tobacco use disorder   Influenza A with pneumonia   Influenza   Acute hypoxic respiratory failure with COPD exacerbation Influenza A infection -Completed 5-day course of Tamiflu.  Bronchodilators, I-S/flutter valve.  Continue steroids.  Supportive care.  Empiric Rocephin and azithromycin.   GERD. -PPI and Carafate.  Supposed to see outpatient GI   Active smoker -Nicotine patch  Anxiety - As needed Xanax  DVT prophylaxis: Lovenox Code Status: Full code Family Communication:    Status is: Inpatient Still has quite a bit of abnormal breath sounds and remains on 5 L nasal cannula.  Continue hospital stay   Subjective: Seen and been at bedside.  Feels little better but still has quite a bit of exertional dyspnea and coughing   Examination: General exam: Appears calm and comfortable, 5 L nasal cannula Respiratory system: Diffuse rhonchi especially with expiratory wheezing Cardiovascular system: S1 & S2 heard, RRR. No JVD, murmurs, rubs, gallops or clicks. No pedal edema. Gastrointestinal system: Abdomen is nondistended, soft and nontender. No organomegaly or masses felt. Normal bowel sounds heard. Central nervous system: Alert and oriented. No focal neurological  deficits. Extremities: Symmetric 5 x 5 power. Skin: No rashes, lesions or ulcers Psychiatry: Judgement and insight appear normal. Mood & affect appropriate.   Objective: Vitals:   11/14/22 1326 11/14/22 1352 11/14/22 2055 11/14/2022 0408  BP:  117/70 111/80 (!) 152/83  Pulse:  95 100 89  Resp:  20 20 20   Temp:  98.3 F (36.8 C) 99.1 F (37.3 C) 97.9 F (36.6 C)  TempSrc:    Oral  SpO2: 90% 93% 96% 90%  Weight:      Height:        Intake/Output Summary (Last 24 hours) at 11/02/2022 0913 Last data filed at 11/10/2022 0550 Gross per 24 hour  Intake 983 ml  Output --  Net 983 ml   Filed Weights   12-06-22 0508  Weight: 70.4 kg     Data Reviewed:   CBC: Recent Labs  Lab 06-Dec-2022 0534 2022/12/06 1956 11/14/22 0534 10/30/2022 0603  WBC 9.2 10.7* 10.1 13.8*  NEUTROABS 7.6  --   --   --   HGB 14.2 13.5 12.2 12.1  HCT 41.9 41.0 37.1 36.5  MCV 88.8 92.3 91.8 91.7  PLT 174 176 174 230   Basic Metabolic Panel: Recent Labs  Lab 2022/12/06 0534 12/06/2022 1956 11/14/22 0534 11/20/2022 0603  NA 132*  --  138 136  K 4.0  --  4.3 3.8  CL 95*  --  103 99  CO2 26  --  28 26  GLUCOSE 135*  --  104* 156*  BUN 13  --  9 6*  CREATININE 0.88 0.80 0.73 0.58  CALCIUM 8.4*  --  8.1* 8.3*  MG  --   --   --  2.3   GFR: Estimated Creatinine Clearance: 65.6 mL/min (by C-G formula based on SCr of 0.58 mg/dL). Liver Function Tests: Recent Labs  Lab Nov 14, 2022 0534 11/14/22 0534  AST 89* 56*  ALT 53* 48*  ALKPHOS 97 77  BILITOT 0.6 0.7  PROT 7.3 5.8*  ALBUMIN 2.9* 2.2*   No results for input(s): "LIPASE", "AMYLASE" in the last 168 hours. No results for input(s): "AMMONIA" in the last 168 hours. Coagulation Profile: No results for input(s): "INR", "PROTIME" in the last 168 hours. Cardiac Enzymes: No results for input(s): "CKTOTAL", "CKMB", "CKMBINDEX", "TROPONINI" in the last 168 hours. BNP (last 3 results) No results for input(s): "PROBNP" in the last 8760 hours. HbA1C: No  results for input(s): "HGBA1C" in the last 72 hours. CBG: No results for input(s): "GLUCAP" in the last 168 hours. Lipid Profile: No results for input(s): "CHOL", "HDL", "LDLCALC", "TRIG", "CHOLHDL", "LDLDIRECT" in the last 72 hours. Thyroid Function Tests: No results for input(s): "TSH", "T4TOTAL", "FREET4", "T3FREE", "THYROIDAB" in the last 72 hours. Anemia Panel: No results for input(s): "VITAMINB12", "FOLATE", "FERRITIN", "TIBC", "IRON", "RETICCTPCT" in the last 72 hours. Sepsis Labs: Recent Labs  Lab 11/14/22 0814 11/14/2022 0603  PROCALCITON 0.32 0.17    Recent Results (from the past 240 hour(s))  Resp panel by RT-PCR (RSV, Flu A&B, Covid) Anterior Nasal Swab     Status: Abnormal   Collection Time: 11/09/22  7:14 AM   Specimen: Anterior Nasal Swab  Result Value Ref Range Status   SARS Coronavirus 2 by RT PCR NEGATIVE NEGATIVE Final    Comment: (NOTE) SARS-CoV-2 target nucleic acids are NOT DETECTED.  The SARS-CoV-2 RNA is generally detectable in upper respiratory specimens during the acute phase of infection. The lowest concentration of SARS-CoV-2 viral copies this assay can detect is 138 copies/mL. A negative result does not preclude SARS-Cov-2 infection and should not be used as the sole basis for treatment or other patient management decisions. A negative result may occur with  improper specimen collection/handling, submission of specimen other than nasopharyngeal swab, presence of viral mutation(s) within the areas targeted by this assay, and inadequate number of viral copies(<138 copies/mL). A negative result must be combined with clinical observations, patient history, and epidemiological information. The expected result is Negative.  Fact Sheet for Patients:  EntrepreneurPulse.com.au  Fact Sheet for Healthcare Providers:  IncredibleEmployment.be  This test is no t yet approved or cleared by the [place] FDA and  has been  authorized for detection and/or diagnosis of SARS-CoV-2 by FDA under an Emergency Use Authorization (EUA). This EUA will remain  in effect (meaning this test can be used) for the duration of the COVID-19 declaration under Section 564(b)(1) of the Act, 21 U.S.C.section 360bbb-3(b)(1), unless the authorization is terminated  or revoked sooner.       Influenza A by PCR POSITIVE (A) NEGATIVE Final   Influenza B by PCR NEGATIVE NEGATIVE Final    Comment: (NOTE) The Xpert Xpress SARS-CoV-2/FLU/RSV plus assay is intended as an aid in the diagnosis of influenza from Nasopharyngeal swab specimens and should not be used as a sole basis for treatment. Nasal washings and aspirates are unacceptable for Xpert Xpress SARS-CoV-2/FLU/RSV testing.  Fact Sheet for Patients: EntrepreneurPulse.com.au  Fact Sheet for Healthcare Providers: IncredibleEmployment.be  This test is not yet approved or cleared by the [place] FDA and has been authorized for detection and/or diagnosis of SARS-CoV-2 by FDA under an Emergency Use Authorization (EUA). This  EUA will remain in effect (meaning this test can be used) for the duration of the COVID-19 declaration under Section 564(b)(1) of the Act, 21 U.S.C. section 360bbb-3(b)(1), unless the authorization is terminated or revoked.     Resp Syncytial Virus by PCR NEGATIVE NEGATIVE Final    Comment: (NOTE) Fact Sheet for Patients: EntrepreneurPulse.com.au  Fact Sheet for Healthcare Providers: IncredibleEmployment.be  This test is not yet approved or cleared by the [place] FDA and has been authorized for detection and/or diagnosis of SARS-CoV-2 by FDA under an Emergency Use Authorization (EUA). This EUA will remain in effect (meaning this test can be used) for the duration of the COVID-19 declaration under Section 564(b)(1) of the Act, 21 U.S.C. section 360bbb-3(b)(1), unless the  authorization is terminated or revoked.  Performed at [facility], [address] [place], [place] [address]   Resp panel by RT-PCR (RSV, Flu A&B, Covid) Nasopharyngeal Swab     Status: Abnormal   Collection Time: 11/11/2022  6:19 AM   Specimen: Nasopharyngeal Swab; Nasal Swab  Result Value Ref Range Status   SARS Coronavirus 2 by RT PCR NEGATIVE NEGATIVE Final    Comment: (NOTE) SARS-CoV-2 target nucleic acids are NOT DETECTED.  The SARS-CoV-2 RNA is generally detectable in upper respiratory specimens during the acute phase of infection. The lowest concentration of SARS-CoV-2 viral copies this assay can detect is 138 copies/mL. A negative result does not preclude SARS-Cov-2 infection and should not be used as the sole basis for treatment or other patient management decisions. A negative result may occur with  improper specimen collection/handling, submission of specimen other than nasopharyngeal swab, presence of viral mutation(s) within the areas targeted by this assay, and inadequate number of viral copies(<138 copies/mL). A negative result must be combined with clinical observations, patient history, and epidemiological information. The expected result is Negative.  Fact Sheet for Patients:  EntrepreneurPulse.com.au  Fact Sheet for Healthcare Providers:  IncredibleEmployment.be  This test is no t yet approved or cleared by the [place] FDA and  has been authorized for detection and/or diagnosis of SARS-CoV-2 by FDA under an Emergency Use Authorization (EUA). This EUA will remain  in effect (meaning this test can be used) for the duration of the COVID-19 declaration under Section 564(b)(1) of the Act, 21 U.S.C.section 360bbb-3(b)(1), unless the authorization is terminated  or revoked sooner.       Influenza A by PCR POSITIVE (A) NEGATIVE Final   Influenza B by PCR NEGATIVE NEGATIVE Final    Comment: (NOTE) The Xpert  Xpress SARS-CoV-2/FLU/RSV plus assay is intended as an aid in the diagnosis of influenza from Nasopharyngeal swab specimens and should not be used as a sole basis for treatment. Nasal washings and aspirates are unacceptable for Xpert Xpress SARS-CoV-2/FLU/RSV testing.  Fact Sheet for Patients: EntrepreneurPulse.com.au  Fact Sheet for Healthcare Providers: IncredibleEmployment.be  This test is not yet approved or cleared by the [place] FDA and has been authorized for detection and/or diagnosis of SARS-CoV-2 by FDA under an Emergency Use Authorization (EUA). This EUA will remain in effect (meaning this test can be used) for the duration of the COVID-19 declaration under Section 564(b)(1) of the Act, 21 U.S.C. section 360bbb-3(b)(1), unless the authorization is terminated or revoked.     Resp Syncytial Virus by PCR NEGATIVE NEGATIVE Final    Comment: (NOTE) Fact Sheet for Patients: EntrepreneurPulse.com.au  Fact Sheet for Healthcare Providers: IncredibleEmployment.be  This test is not yet approved or cleared by the [place]  FDA and has been authorized for detection and/or diagnosis of SARS-CoV-2 by FDA under an Emergency Use Authorization (EUA). This EUA will remain in effect (meaning this test can be used) for the duration of the COVID-19 declaration under Section 564(b)(1) of the Act, 21 U.S.C. section 360bbb-3(b)(1), unless the authorization is terminated or revoked.  Performed at [facility], [address], [place], [place] [address]          Radiology Studies: No results found.      Scheduled Meds:  benzonatate  100 mg Oral TID   dextromethorphan  30 mg Oral BID   enoxaparin (LOVENOX) injection  40 mg Subcutaneous Q24H   guaiFENesin  600 mg Oral BID   ipratropium-albuterol  3 mL Nebulization TID   methylPREDNISolone (SOLU-MEDROL) injection  81.25 mg Intravenous  Q12H   nicotine  21 mg Transdermal Daily   oseltamivir  75 mg Oral BID   pantoprazole  40 mg Oral Daily   sucralfate  1 g Oral BID   Continuous Infusions:  azithromycin Stopped (11/14/22 1045)   cefTRIAXone (ROCEPHIN)  IV 2 g (12/03/2022 0459)     LOS: 1 day   Time spent= 35 mins    Jadin Creque Joline Maxcy, MD [facility]  If 7PM-7AM, please contact night-coverage  03-Dec-2022, 9:13 AM

## 2022-11-15 NOTE — Progress Notes (Signed)
RN went into patients room and found patient lying in bed, with her oxygen off. O2 sats in the 60s-70s. RN awoke patient and assisted patient with putting her oxygen back on. Pt arousable to pain only, mumbles her name and name of hospital while swiftly drifting back off to sleep. RN asked patient if she could squeeze my hands, pt unable to follow commands. Left arm with no movement, responded to painful stimuli.   Rapid Response initiated, On call provider notified.

## 2022-11-15 NOTE — Anesthesia Procedure Notes (Signed)
Arterial Line Insertion Start/End2024/01/29 10:21 PM, 11-27-2022 10:29 PM Performed by: Tai Skelly T, Immunologist, CRNA  Patient location: OOR procedure area. Emergency situation Patient sedated Left, radial was placed Catheter size: 20 G Hand hygiene performed  and maximum sterile barriers used   Attempts: 2 Procedure performed without using ultrasound guided technique. Following insertion, dressing applied and Biopatch. Post procedure assessment: normal  Patient tolerated the procedure well with no immediate complications.

## 2022-11-15 NOTE — Progress Notes (Addendum)
1957: Code stroke cart activated at this time. Pt currently enroute to CT. LKW 1730. ROS 1945. Pt with AMS and left sided weakness. Raenette Rover, NP at bedside.   2006: TSP paged at this time.  2009: TSP on camera at this time. Limited assessment completed. Advanced imaging ordered at this time. Attempted to complete, but patient is agitated.  2029: TSP completing assessment at this time. Ativan ordered.  2044: Ativan given at this time for sedation for advanced imaging to be completed.  2048: Advanced imaging completed at this time.  2056:Advanced imaging results provided to Dr. Owens Shark, TSP by radiologist.  2057: [facility] called at this time to connect with neurohospitalist for possible transfer.  2105: Dr. Lorrin Goodell, Dr. Sabas Sous, and Dr. Owens Shark on call at this time.  2138: Decision to transfer patient made by neurology team with family.  2140: At baseline pt walks with a walker. MRS 2 2144: Off call with [facility] at this time. Raenette Rover, NP updated on plan of care.  2148: [facility] at bedside to transport patient.

## 2022-11-15 NOTE — Consult Note (Signed)
[facility] Tele[facility]urology Consult Services   Patient Name:   Melinda [name], Melinda [name] Date of Birth:   May 13, 1956 Identification Number:   MRN - [id number] Date of Service:   11/14/2022 20:06:12  Diagnosis:       I63.311 - Cerebrovascular accident (CVA) due to thrombosis of right middle cerebral artery [facility])  Impression:      Pt is a 67 yo F with COPD who was admitted on 1/15 with Influenzas A and hypoxic respiratory failure who was noted by nursing staff to have left sided weakness. CT Head and it showed an area of hypoattenuation in the anterior right temporal lobe and insula concerning for an area of acute infarction. ASEPCTS score was initially estimated to be a 6 but then when reviewed with radiology there was also felt to be some hypoattenuation in the left parieto-occipital lobe with possible ASPECTS score of 6. The risks of iv-tNK was therefore felt to outweigh the potential benefits as the time last known well of 17:30 was unclear as the day shift nursing staff was not available and the pt's daughter noted that the patient had slurred speech earlier in the day. CTA head showed a right M1and P2 occlusion. CT perfusion did an area of CBF < 30% of 147 as well as a 189 ml area of Tmax > 6 ml. However, since the area of hypoattenuation of the non-contrast CT was not frank hypodensity but more loss of the gray-white matter junction , the area of of care infarct on CTP was felt to be possibly be an overestimation when reviewed with neuroradiology and neuro-IR. While it was felt that pt did have a risk of ICH and could potentially be left with deficits based on the are of core infarct seen on non-contrast CT, the risks of mechanical thrombectomy was discussed with the patient's daughter over the phone and she agreed to mechanical thrombectomy and so patient is to be transferred.  Discussed with NIR Text: CTA head showed a right M1and P2 occlusion. CT perfusion did an area of CBF < 30% of  147 as well as a 189 ml area of Tmax > 6 ml. However, since the area of hypoattenuation of the non-contrast CT was not frank hypodensity but more loss of the gray-white matter junction , the area of of care infarct on CTP was felt to be possibly be an overestimation when reviewed with neuroradiology and neuro-IR.  Our recommendations are outlined below.  Recommendations:        Stroke/Telemetry Floor       Neuro Checks       Bedside Swallow Eval       DVT Prophylaxis       IV Fluids, Normal Saline       Head of Bed 30 Degrees       Euglycemia and Avoid Hyperthermia (PRN Acetaminophen)  Sign Out:       Discussed with Emergency Department Provider    ------------------------------------------------------------------------------  Advanced Imaging: CTA Head and Neck Completed.  CTP Completed.  LVO:Yes  Discussed with NIR :Yes  Initial Call Time To NIR : 11/14/2022 21:00:23  NIR Accept/Decision Time : 11/23/2022 21:38:03  Discussed with NIR Time : 11/26/2022 21:12:32  Discussed with NIR Text : CTA head showed a right M1and P2 occlusion. CT perfusion did an area of CBF < 30% of 147 as well as a 189 ml area of Tmax > 6 ml. However, since the area of hypoattenuation of the non-contrast CT was not frank hypodensity but more  loss of the gray-white matter junction , the area of of care infarct on CTP was felt to be possibly be an overestimation when reviewed with neuroradiology and neuro-IR.  Neurointerventionalist Accepted Case : Case accepted for intervention.   Metrics: Last Known Well: 11/29/22 17:30:00 [facility] Notification Time: 11/29/22 20:06:12 Stamp Time: 2022-11-29 20:06:12 Initial Response Time: 2022-11-29 27:78:24 Symptoms: Left sided weakness. . Initial patient interaction: 11-29-22 20:30:11 NIHSS Assessment Completed: November 29, 2022 20:12:35 Patient is not a candidate for Thrombolytic. Thrombolytic Medical Decision: 11/29/2022 20:12:36 Patient was not  deemed candidate for Thrombolytic because of following reasons: CT demonstrating multilobar infarction (hypodensity greater than 1/3 cerebral hemisphere).  I personally Reviewed the CT Head and it showed an area of hypoattenuation in the anterior right temporal lobe and insula concerning for an area of acute infarction. ASEPCTS score was initially estimated to be a 6 but then when reviewed with radiology there was also felt to be some hypoattenuation in the left parieto-occipital lobe with possible ASPECTS score of 6.  Primary Provider Notified of Diagnostic Impression and Management Plan on: 29-Nov-2022 21:13:01 Spoke With: Anthoney Harada Able to Reach 29-Nov-2022 21:13:01    ------------------------------------------------------------------------------  History of Present Illness: Patient is a 67 year old Female.  Inpatient stroke alert was called for symptoms of Left sided weakness. . Pt is a 67 yo F with COPD who was admitted on 1/15 with Influenzas A and hypoxic respiratory failure who was noted by nursing staff to have left sided weakness. PT was reportedly last known well by day shift at 17:30. Evening shift reports that she was found at 19:45 off her Oxygen with decreased level of responsiveness and left sided weakness. Pt had PT around 13:30. I spoke with the patient's daughter daughter, she spoke with her mother shortly after her PT and her mother seemed slightly 'off" with slurred speech.    Past Medical History:      There is no history of Hypertension      There is no history of Diabetes Mellitus      There is no history of Hyperlipidemia      There is no history of Atrial Fibrillation      There is no history of Coronary Artery Disease      There is no history of Stroke [place] PMH:  COPD. GERD  Medications:  No Anticoagulant use  No Antiplatelet use Reviewed EMR for current medications Other Medications Pertinent To Assessment Include: Lovenox 40 mg SQ  Allergies:   Reviewed Description: Aspirin, Succinylcholine, Tramadol, Tizanidine  Social History: Smoking: Former  Family History:  There is no family history of premature cerebrovascular disease pertinent to this consultation  ROS : ROS Cannot Be Obtained Because:  Patient Is Obtunded/ Comatose  Past Surgical History: Past Surgical History Cannot Be Obtained Because: Patient Is Obtunded/ Comatose     Examination: BP(137/65), Pulse(85), 1A: Level of Consciousness - Movements to Pain + 2 1B: Ask Month and Age - Aphasic + 2 1C: Blink Eyes & Squeeze Hands - Performs 0 Tasks + 2 2: Test Horizontal Extraocular Movements - Forced Gaze Palsy: Cannot Be Overcome + 2 3: Test Visual Fields - No Visual Loss + 0 4: Test Facial Palsy (Use Grimace if Obtunded) - Normal symmetry + 0 5A: Test Left Arm Motor Drift - No Movement + 4 5B: Test Right Arm Motor Drift - Drift, but doesn't hit bed + 1 6A: Test Left Leg Motor Drift - No Movement + 4 6B: Test Right Leg Motor Drift -  Drift, but doesn't hit bed + 1 7: Test Limb Ataxia (FNF/Heel-Shin) - No Ataxia + 0 8: Test Sensation - Complete Loss: Cannot Sense Being Touched At All + 2 9: Test Language/Aphasia - Mute/Global Aphasia: No Usable Speech/Auditory Comprehension + 3 10: Test Dysarthria - Mute/Anarthric + 2 11: Test Extinction/Inattention - Profound hemi-inattention (ex: does not recognize own hand) + 2  NIHSS Score: 27   Pre-Morbid Modified Rankin Scale: Unable to assess  Spoke with : Raenette Rover  Patient/Family was informed the Neurology Consult would occur via TeleHealth consult by way of interactive audio and video telecommunications and consented to receiving care in this manner.   Patient is being evaluated for possible acute neurologic impairment and high probability of imminent or life-threatening deterioration. I spent total of 75 minutes providing care to this patient, including time for face to face visit via telemedicine, review of  medical records, imaging studies and discussion of findings with providers, the patient and/or family.   Dr Sherri Rad   [facility] For Inpatient follow-up with [facility] physician please call [facility] [phone]. This is not an outpatient service. Post hospital discharge, please contact hospital directly.  Please do not communicate with [facility] physicians via secure chat. If you have any questions, Please contact [facility]. Please call or reconsult our service if there are any clinical or diagnostic changes.

## 2022-11-15 NOTE — H&P (Addendum)
NEUROLOGY CONSULTATION NOTE   Date of service: November 15, 2022 Patient Name: Melinda [name] MRN:  [id number] DOB:  05-Jun-1956  _ _ _   _ __   _ __ _ _  __ __   _ __   __ _  History of Present Illness  Melinda [name] is a 67 y.o. female with PMH significant for GERD, tobacco use, anxiety, who is admitted at [facility] for hypoxic respiratory failure and influenza A positive.  In the evening, she was more confused and altered and took of her oxygen and satting 60-70. Rapid response called and improve with putting her back on oxygen. She was evaluated by hospitalist team and noted to have trouble moving her left side.  A code stroke was activated. LKW somnewhat unclear but discussed with dayshift RN over phone and was felt to be 1730.  CTH, CTA, CTP obtained and she was evaluated by teleneurology. CT Head with develping R MCA stroke with loss of graywhite differentiation in R MCA and R occipital lobe with no frank hypodensitywith ASPECTS of 6. CTA with R MCA M1 occlusion and R PCA P2 occlusion. CTP with core of 147 and penumbra of 42. However, perfusion felt to somewhat over-estimate core. Not felt to be a candidate for tnkase by teleneurology 2/2 somewhat unclear LKW and patient's daughter had noted some slurred speech earlier in the afternoon around 1430. Thrombectomy was discussed with daughter Ms.Caprice Beaver and Ms. Dalbert Batman over phone with me on the line, teleneurologist Dr. Owens Shark and Dr. Juliet Rude. Details of thrombectomy, risks, benefits and alternatives were discussed. It was discussed with daughter in detail that patient will probably have deficit on the left given the appearance of stroke on the CT concerning for a large core. It was also discussed with daughter that patient is at higher risk of ICH than usual thrombectomy patients. Daughter understood the risks and benefits and consented to thrombectomy.  LKW: ?1430 vs 1730 mRS: 0 tNKASE: not offered, unclear LKW Thrombectomy: transferred to  [facility] for thrombectomy after extensive discussion with daughters documented above. Some delay to activating IR due to extensive discussion with family given the complexity of the situation. Thrombectomy was initially discussed only with daughter Ms. Caprice Beaver but she wanted to have this discussion revisited with her other sister Ms. Dalbert Batman over phone which led to additional delay to activation of IR. NIHSS components Score: Comment  1a Level of Conscious 0[]  1[]  2[x]  3[]      1b LOC Questions 0[]  1[]  2[x]       1c LOC Commands 0[x]  1[]  2[]       2 Best Gaze 0[]  1[]  2[x]       3 Visual 0[]  1[]  2[x]  3[]      4 Facial Palsy 0[]  1[]  2[x]  3[]      5a Motor Arm - left 0[]  1[]  2[]  3[x]  4[]  UN[]    5b Motor Arm - Right 0[x]  1[]  2[]  3[]  4[]  UN[]    6a Motor Leg - Left 0[]  1[]  2[]  3[x]  4[]  UN[]    6b Motor Leg - Right 0[x]  1[]  2[]  3[]  4[]  UN[]    7 Limb Ataxia 0[x]  1[]  2[]  3[]  UN[]     8 Sensory 0[]  1[]  2[x]  UN[]      9 Best Language 0[]  1[]  2[x]  3[]      10 Dysarthria 0[]  1[]  2[x]  UN[]      11 Extinct. and Inattention 0[]  1[]  2[x]       TOTAL: 24       ROS   Unable to obtain 2/2 acuity of  the situation.  Past History   Past Medical History:  Diagnosis Date   Reflux    Sprain of neck 11/24/2013   Past Surgical History:  Procedure Laterality Date   ABDOMINAL HYSTERECTOMY     BREAST BIOPSY Right 02/03/2013   stereotactic biopsy negative   BREAST BIOPSY Left 2006   negative   BREAST BIOPSY Left 07/14/2009   negative with clip   BREAST CYST ASPIRATION Left 2006   negative   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   CERVICAL FUSION     CESAREAN SECTION     ESOPHAGEAL DILATION     Family History  Problem Relation Age of Onset   Diabetes Father    Breast cancer Maternal Aunt    Breast cancer Maternal Grandmother    Breast cancer Cousin    Breast cancer Cousin    Social History   Socioeconomic History   Marital status: Married    Spouse name: Not on file   Number of children: 4   Years of education: 12    Highest education level: Not on file  Occupational History   Occupation: WAITRESS    Employer: [employer]  Tobacco Use   Smoking status: Former    Types: Cigarettes   Smokeless tobacco: Never  Substance and Sexual Activity   Alcohol use: No    Alcohol/week: 0.0 standard drinks of alcohol   Drug use: No   Sexual activity: Yes  Other Topics Concern   Not on file  Social History Narrative   Patient lives at home with daughter.    Patient is single.    Patient has 4 children.    Patient has a high school education.    Patient is right handed.   Patient is currently unemployed.          Social Determinants of Health   Financial Resource Strain: Not on file  Food Insecurity: No Food Insecurity (11/14/2022)   Hunger Vital Sign    Worried About Running Out of Food in the Last Year: Never true    Ran Out of Food in the Last Year: Never true  Transportation Needs: No Transportation Needs (11/14/2022)   PRAPARE - Administrator, Civil Service (Medical): No    Lack of Transportation (Non-Medical): No  Physical Activity: Not on file  Stress: Not on file  Social Connections: Not on file   Allergies  Allergen Reactions   Aspirin Anaphylaxis   Succinylcholine Anaphylaxis    coma   Tramadol Itching   Tizanidine Anxiety    Medications   Medications Prior to Admission  Medication Sig Dispense Refill Last Dose   Calcium Carbonate Antacid (TUMS PO) Take 2 tablets by mouth 2 (two) times daily as needed (heartburn).   Past Week   omeprazole (PRILOSEC) 20 MG capsule Take 1 capsule (20 mg total) by mouth daily. (Patient not taking: Reported on 11/14/2022) 21 capsule 0 Not Taking     Vitals   Vitals:   11/27/2022 2106 11/27/2022 2114 10/31/2022 2137 11/13/2022 2139  BP:  (!) 206/103 (!) 196/106 (!) 202/103  Pulse:  (!) 105 97 100  Resp:  (!) 38 (!) 29 (!) 29  Temp: (!) 97 F (36.1 C)     TempSrc: Axillary     SpO2:  91% 96% 98%  Weight: 72.3 kg     Height: 5\' 6"   (1.676 m)        Body mass index is 25.73 kg/m.  Physical  Exam   General: Laying comfortably in bed; in no acute distress. HENT: Normal oropharynx and mucosa. Normal external appearance of ears and nose.  Neck: Supple, no pain or tenderness  CV: No JVD. No peripheral edema.  Pulmonary: Symmetric Chest rise. Tachypneic and on high flow oxygen. Abdomen: Soft to touch, non-tender.  Ext: No cyanosis, edema, or deformity  Skin: No rash. Normal palpation of skin.   Musculoskeletal: Normal digits and nails by inspection. No clubbing.   Neurologic Examination  Mental status/Cognition: eyes closed, partially opens eyes to loud voice.oriented to self, but disoriented to place, month. Gives a thumbs up on commands but not much else. Speech/language: Non fluent, dysarthric speech. Intermittently follows 1 step commands. Does not identify objects. Cranial nerves:   CN II Pupils equal and reactive to light, does not blink to threat on the left.   CN III,IV,VI R gaze deviation, did not cross midline but hard to assess as she keeps her eyes closed.   CN V normal sensation in V1, V2, and V3 segments bilaterally   CN VII L facial droop   CN VIII normal hearing to speech   CN IX & X Seems to be protecting her airway for now.   CN XI Head turned to the right   CN XII midline tongue protrusion   Motor:  Muscle bulk: normal, tone flaccid in LUE Keeps RUE off the bed for more than 10 secs without any drift. 5/5 hand grip on the right. LUE flaccid with some movement at the elbow. RLE: drifts down when held up off the bed. LLE: flaccid with very little movement.  Sensation:  Light touch    Pin prick No response to pinch in LUE and LLE.   Temperature    Vibration   Proprioception    Coordination/Complex Motor:  Unable to assess. - Gait: deferred for patient safety.  Labs   CBC:  Recent Labs  Lab Nov 17, 2022 0534 11-17-22 1956 11/14/22 0534 11/02/2022 0603  WBC 9.2   < > 10.1 13.8*   NEUTROABS 7.6  --   --   --   HGB 14.2   < > 12.2 12.1  HCT 41.9   < > 37.1 36.5  MCV 88.8   < > 91.8 91.7  PLT 174   < > 174 230   < > = values in this interval not displayed.    Basic Metabolic Panel:  Lab Results  Component Value Date   NA 136 11/29/2022   K 3.8 11/10/2022   CO2 26 11/14/2022   GLUCOSE 156 (H) 10/31/2022   BUN 6 (L) 11/29/2022   CREATININE 0.58 11/29/2022   CALCIUM 8.3 (L) 11/01/2022   GFRNONAA >60 11/17/2022   GFRAA >60 07/18/2020   Lipid Panel: No results found for: "LDLCALC" HgbA1c: No results found for: "HGBA1C" Urine Drug Screen:     Component Value Date/Time   LABOPIA NEGATIVE 09/19/2013 1545   COCAINSCRNUR NEGATIVE 09/19/2013 1545   LABBENZ NEGATIVE 09/19/2013 1545   AMPHETMU NEGATIVE 09/19/2013 1545   THCU NEGATIVE 09/19/2013 1545   LABBARB NEGATIVE 09/19/2013 1545    Alcohol Level No results found for: "ETH"  CT Head without contrast(Personally reviewed): Developing R MCA stroke with ASPECTS of 6.  CT angio Head and Neck with contrast(Personally reviewed): R MCA M1 occlusion with poor flow distally. R PCA P2 occlusion.  MRI Brain: pending  Impression   DENYLA CORTESE is a 67 y.o. female  with PMH significant for GERD, tobacco use, anxiety,  who is admitted at [facility] for hypoxic respiratory failure and influenza A positive.  In the evening, she was more confused and altered and took of her oxygen and satting 60-70. Rapid response called and improve with putting her back on oxygen. She was evaluated by hospitalist team and noted to have trouble moving her left side.  A code stroke was activated and she was found to have R MCA M1 occlusion and R PCA P2 occlusion. Orient with aspects of 6. She was not deemed a candidate for tnkase by teleneurology due to somewhat unclear LKW. She is high risk for hemorrhage and malignant R MCA stroke and risks benefits and alternatives discussed with daughter over phone very extensively. Daughter cosented to  thrombectomy.  Primary Diagnosis:  Cerebral infarction due to embolism of  right middle cerebral artery.   Secondary Diagnosis: Brain compression, Cerebral edema, Essential (primary) hypertension, and Hypertension Emergency (SBP > 180 or DBP > 120 & end organ damage) Acute respiratory failure with hypoxia. Encephalopathy due to embolism of right MCA  Recommendations  Acute R MCA stroke with cerebral edema, brain compression s/p thrombectomy: - Frequent Neuro checks per stroke unit protocol - Brain imaging with MRI Brain without contrast - Recommend obtaining TTE - Recommend obtaining Lipid panel with LDL - Please start statin if LDL > 70 - Recommend HbA1c to evaluate for diabetes and how well it is controlled. - Antithrombotic - per Neuro IR ofr the first 24 hours after intervention. - Recommend DVT ppx - SBP goal - per Neuro IR for the first 24 hours after intervention. - Recommend Telemetry monitoring for arrythmia - Recommend bedside swallow screen prior to PO intake. - Stroke education booklet - Recommend PT/OT/SLP consult - Thrombectomy per Neuro IR - Close monitoring in the Neuro ICU and hemicrani watch. - repeat CT Head in a few hours to monitor developing cerebral edema.  Hypoxic respiratory failure with COPD and influenza A and CTA head and neck demonstrating diffuse nodular and ground-glass opacities concerning for Pneumonia. - PCCM consulted. - s/p tamiflu. - on azithromycin and Ceftriaxone.  GERD: - PPI   ______________________________________________________________________  CODE STATUS: Discussed with daughters over phone and patient is full code. Allergies verified with daughters and updated.   This patient is critically ill and at significant risk of neurological worsening, death and care requires constant monitoring of vital signs, hemodynamics,respiratory and cardiac monitoring, neurological assessment, discussion with family, other specialists and  medical decision making of high complexity. I spent 105 minutes of neurocritical care time  in the care of  this patient. This was time spent independent of any time provided by nurse practitioner or PA.  Donnetta Simpers [facility] Pager Number [phone] 11/16/2022  12:47 AM   Thank you for the opportunity to take part in the care of this patient. If you have any further questions, please contact the neurology consultation attending.  Signed,  [place] Pager Number [phone] _ _ _   _ __   _ __ _ _  __ __   _ __   __ _

## 2022-11-16 ENCOUNTER — Inpatient Hospital Stay (HOSPITAL_COMMUNITY): Payer: 59

## 2022-11-16 ENCOUNTER — Encounter (HOSPITAL_COMMUNITY): Payer: Self-pay | Admitting: Interventional Radiology

## 2022-11-16 DIAGNOSIS — I63511 Cerebral infarction due to unspecified occlusion or stenosis of right middle cerebral artery: Secondary | ICD-10-CM | POA: Diagnosis not present

## 2022-11-16 DIAGNOSIS — I639 Cerebral infarction, unspecified: Secondary | ICD-10-CM | POA: Diagnosis not present

## 2022-11-16 DIAGNOSIS — I6601 Occlusion and stenosis of right middle cerebral artery: Secondary | ICD-10-CM | POA: Diagnosis present

## 2022-11-16 DIAGNOSIS — J9601 Acute respiratory failure with hypoxia: Secondary | ICD-10-CM | POA: Diagnosis not present

## 2022-11-16 HISTORY — PX: IR PERCUTANEOUS ART THROMBECTOMY/INFUSION INTRACRANIAL INC DIAG ANGIO: IMG6087

## 2022-11-16 HISTORY — PX: IR CT HEAD LTD: IMG2386

## 2022-11-16 LAB — POCT I-STAT 7, (LYTES, BLD GAS, ICA,H+H)
Acid-Base Excess: 1 mmol/L (ref 0.0–2.0)
Acid-base deficit: 2 mmol/L (ref 0.0–2.0)
Bicarbonate: 27.8 mmol/L (ref 20.0–28.0)
Bicarbonate: 28.6 mmol/L — ABNORMAL HIGH (ref 20.0–28.0)
Calcium, Ion: 1.16 mmol/L (ref 1.15–1.40)
Calcium, Ion: 1.17 mmol/L (ref 1.15–1.40)
HCT: 36 % (ref 36.0–46.0)
HCT: 30 % — ABNORMAL LOW (ref 36.0–46.0)
Hemoglobin: 12.2 g/dL (ref 12.0–15.0)
Hemoglobin: 10.2 g/dL — ABNORMAL LOW (ref 12.0–15.0)
O2 Saturation: 99 %
O2 Saturation: 99 %
Patient temperature: 98.6
Patient temperature: 34.6
Potassium: 3.9 mmol/L (ref 3.5–5.1)
Potassium: 4.1 mmol/L (ref 3.5–5.1)
Sodium: 140 mmol/L (ref 135–145)
Sodium: 139 mmol/L (ref 135–145)
TCO2: 30 mmol/L (ref 22–32)
TCO2: 31 mmol/L (ref 22–32)
pCO2 arterial: 70.7 mmHg (ref 32–48)
pCO2 arterial: 57.8 mmHg — ABNORMAL HIGH (ref 32–48)
pH, Arterial: 7.202 — ABNORMAL LOW (ref 7.35–7.45)
pH, Arterial: 7.29 — ABNORMAL LOW (ref 7.35–7.45)
pO2, Arterial: 158 mmHg — ABNORMAL HIGH (ref 83–108)
pO2, Arterial: 164 mmHg — ABNORMAL HIGH (ref 83–108)

## 2022-11-16 LAB — CBC
HCT: 32.4 % — ABNORMAL LOW (ref 36.0–46.0)
Hemoglobin: 10.6 g/dL — ABNORMAL LOW (ref 12.0–15.0)
MCH: 30.5 pg (ref 26.0–34.0)
MCHC: 32.7 g/dL (ref 30.0–36.0)
MCV: 93.4 fL (ref 80.0–100.0)
Platelets: 232 10*3/uL (ref 150–400)
RBC: 3.47 MIL/uL — ABNORMAL LOW (ref 3.87–5.11)
RDW: 14.6 % (ref 11.5–15.5)
WBC: 9.2 10*3/uL (ref 4.0–10.5)
nRBC: 0 % (ref 0.0–0.2)

## 2022-11-16 LAB — BASIC METABOLIC PANEL
Anion gap: 6 (ref 5–15)
Anion gap: 8 (ref 5–15)
BUN: 5 mg/dL — ABNORMAL LOW (ref 8–23)
BUN: 6 mg/dL — ABNORMAL LOW (ref 8–23)
CO2: 27 mmol/L (ref 22–32)
CO2: 26 mmol/L (ref 22–32)
Calcium: 7.6 mg/dL — ABNORMAL LOW (ref 8.9–10.3)
Calcium: 7.5 mg/dL — ABNORMAL LOW (ref 8.9–10.3)
Chloride: 104 mmol/L (ref 98–111)
Chloride: 106 mmol/L (ref 98–111)
Creatinine, Ser: 0.54 mg/dL (ref 0.44–1.00)
Creatinine, Ser: 0.58 mg/dL (ref 0.44–1.00)
GFR, Estimated: >60 mL/min (ref >60)
GFR, Estimated: >60 mL/min (ref >60)
Glucose, Bld: 158 mg/dL — ABNORMAL HIGH (ref 70–99)
Glucose, Bld: 129 mg/dL — ABNORMAL HIGH (ref 70–99)
Potassium: 4.1 mmol/L (ref 3.5–5.1)
Potassium: 4.2 mmol/L (ref 3.5–5.1)
Sodium: 137 mmol/L (ref 135–145)
Sodium: 140 mmol/L (ref 135–145)

## 2022-11-16 LAB — ECHOCARDIOGRAM COMPLETE
Area-P 1/2: 4.8 cm2
Height: 63 in
S' Lateral: 2.6 cm
Weight: 2550.28 oz

## 2022-11-16 LAB — GLUCOSE, CAPILLARY
Glucose-Capillary: 146 mg/dL — ABNORMAL HIGH (ref 70–99)
Glucose-Capillary: 177 mg/dL — ABNORMAL HIGH (ref 70–99)
Glucose-Capillary: 187 mg/dL — ABNORMAL HIGH (ref 70–99)

## 2022-11-16 LAB — CBC WITH DIFFERENTIAL/PLATELET
Abs Immature Granulocytes: 0.1 10*3/uL — ABNORMAL HIGH (ref 0.00–0.07)
Basophils Absolute: 0 10*3/uL (ref 0.0–0.1)
Basophils Relative: 0 %
Eosinophils Absolute: 0 10*3/uL (ref 0.0–0.5)
Eosinophils Relative: 0 %
HCT: 31.8 % — ABNORMAL LOW (ref 36.0–46.0)
Hemoglobin: 10.6 g/dL — ABNORMAL LOW (ref 12.0–15.0)
Immature Granulocytes: 1 %
Lymphocytes Relative: 8 %
Lymphs Abs: 0.7 10*3/uL (ref 0.7–4.0)
MCH: 31.4 pg (ref 26.0–34.0)
MCHC: 33.3 g/dL (ref 30.0–36.0)
MCV: 94.1 fL (ref 80.0–100.0)
Monocytes Absolute: 0.7 10*3/uL (ref 0.1–1.0)
Monocytes Relative: 8 %
Neutro Abs: 8 10*3/uL — ABNORMAL HIGH (ref 1.7–7.7)
Neutrophils Relative %: 83 %
Platelets: 231 10*3/uL (ref 150–400)
RBC: 3.38 MIL/uL — ABNORMAL LOW (ref 3.87–5.11)
RDW: 14.4 % (ref 11.5–15.5)
WBC: 9.6 10*3/uL (ref 4.0–10.5)
nRBC: 0 % (ref 0.0–0.2)

## 2022-11-16 LAB — LIPID PANEL
Cholesterol: 153 mg/dL (ref 0–200)
HDL: 22 mg/dL — ABNORMAL LOW (ref >40)
LDL Cholesterol: 65 mg/dL (ref 0–99)
Total CHOL/HDL Ratio: 7 RATIO
Triglycerides: 330 mg/dL — ABNORMAL HIGH (ref <150)
VLDL: 66 mg/dL — ABNORMAL HIGH (ref 0–40)

## 2022-11-16 LAB — HEMOGLOBIN A1C
Hgb A1c MFr Bld: 5.9 % — ABNORMAL HIGH (ref 4.8–5.6)
Mean Plasma Glucose: 122.63 mg/dL

## 2022-11-16 LAB — PHOSPHORUS
Phosphorus: 4 mg/dL (ref 2.5–4.6)
Phosphorus: 2.1 mg/dL — ABNORMAL LOW (ref 2.5–4.6)

## 2022-11-16 LAB — MAGNESIUM
Magnesium: 2.1 mg/dL (ref 1.7–2.4)
Magnesium: 2.1 mg/dL (ref 1.7–2.4)

## 2022-11-16 LAB — SODIUM
Sodium: 143 mmol/L (ref 135–145)
Sodium: 148 mmol/L — ABNORMAL HIGH (ref 135–145)

## 2022-11-16 LAB — PROCALCITONIN: Procalcitonin: 0.12 ng/mL

## 2022-11-16 MED ORDER — VITAL HIGH PROTEIN PO LIQD: 1000.0000 mL | ORAL | Status: DC

## 2022-11-16 MED ORDER — GUAIFENESIN 100 MG/5ML PO LIQD: 5.0000 mL | ORAL | Status: DC | PRN

## 2022-11-16 MED ORDER — SODIUM CHLORIDE 0.9 % IV SOLN: INTRAVENOUS | Status: DC

## 2022-11-16 MED ORDER — ACETAMINOPHEN 650 MG RE SUPP: 650.0000 mg | RECTAL | Status: DC | PRN

## 2022-11-16 MED ORDER — FENTANYL 2500MCG IN NS 250ML (10MCG/ML) PREMIX INFUSION
0.0000 ug/h | INTRAVENOUS | Status: DC
  Administered 2022-11-16: 25 ug/h via INTRAVENOUS
  Administered 2022-11-17: 150 ug/h via INTRAVENOUS
  Filled 2022-11-16 (×2): qty 250

## 2022-11-16 MED ORDER — CLEVIDIPINE BUTYRATE 0.5 MG/ML IV EMUL
0.0000 mg/h | INTRAVENOUS | Status: AC
  Administered 2022-11-16: 6 mg/h via INTRAVENOUS
  Administered 2022-11-16: 2 mg/h via INTRAVENOUS
  Administered 2022-11-16 (×2): 6 mg/h via INTRAVENOUS
  Filled 2022-11-16: qty 100
  Filled 2022-11-16 (×3): qty 50

## 2022-11-16 MED ORDER — CLEVIDIPINE BUTYRATE 0.5 MG/ML IV EMUL
0.0000 mg/h | INTRAVENOUS | Status: DC
  Administered 2022-11-16: 4 mg/h via INTRAVENOUS
  Filled 2022-11-16: qty 50

## 2022-11-16 MED ORDER — ALPRAZOLAM 0.5 MG PO TABS: 0.5000 mg | ORAL_TABLET | Freq: Two times a day (BID) | ORAL | Status: DC | PRN

## 2022-11-16 MED ORDER — OSMOLITE 1.5 CAL PO LIQD
1000.0000 mL | ORAL | Status: DC
  Administered 2022-11-16: 1000 mL

## 2022-11-16 MED ORDER — CHLORHEXIDINE GLUCONATE CLOTH 2 % EX PADS
6.0000 | MEDICATED_PAD | Freq: Every day | CUTANEOUS | Status: DC
  Administered 2022-11-17: 6 via TOPICAL

## 2022-11-16 MED ORDER — FENTANYL CITRATE PF 50 MCG/ML IJ SOSY
50.0000 ug | PREFILLED_SYRINGE | INTRAMUSCULAR | Status: DC | PRN
  Administered 2022-11-16 (×2): 50 ug via INTRAVENOUS
  Filled 2022-11-16 (×2): qty 1

## 2022-11-16 MED ORDER — FENTANYL BOLUS VIA INFUSION: 25.0000 ug | INTRAVENOUS | Status: DC | PRN

## 2022-11-16 MED ORDER — ACETAMINOPHEN 325 MG PO TABS: 650.0000 mg | ORAL_TABLET | ORAL | Status: DC | PRN

## 2022-11-16 MED ORDER — SENNOSIDES-DOCUSATE SODIUM 8.6-50 MG PO TABS: 1.0000 | ORAL_TABLET | Freq: Every evening | ORAL | Status: DC | PRN

## 2022-11-16 MED ORDER — ACETAMINOPHEN 160 MG/5ML PO SOLN: 650.0000 mg | ORAL | Status: DC | PRN

## 2022-11-16 MED ORDER — SODIUM CHLORIDE 3 % IV SOLN
INTRAVENOUS | Status: DC
  Filled 2022-11-16 (×4): qty 500

## 2022-11-16 MED ORDER — FAMOTIDINE 20 MG PO TABS
20.0000 mg | ORAL_TABLET | Freq: Two times a day (BID) | ORAL | Status: DC
  Administered 2022-11-16 – 2022-11-17 (×3): 20 mg
  Filled 2022-11-16 (×3): qty 1

## 2022-11-16 MED ORDER — PROPOFOL 1000 MG/100ML IV EMUL
0.0000 ug/kg/min | INTRAVENOUS | Status: DC
  Administered 2022-11-16: 50 ug/kg/min via INTRAVENOUS
  Administered 2022-11-16: 40 ug/kg/min via INTRAVENOUS
  Administered 2022-11-16: 20 ug/kg/min via INTRAVENOUS
  Administered 2022-11-16: 77.916 ug/kg/min via INTRAVENOUS
  Administered 2022-11-17: 25 ug/kg/min via INTRAVENOUS
  Administered 2022-11-17: 20 ug/kg/min via INTRAVENOUS
  Filled 2022-11-16 (×3): qty 100

## 2022-11-16 MED ORDER — CLEVIDIPINE BUTYRATE 0.5 MG/ML IV EMUL
INTRAVENOUS | Status: AC
  Administered 2022-11-17: 6 mg/h via INTRAVENOUS
  Filled 2022-11-16: qty 50

## 2022-11-16 MED ORDER — IOHEXOL 350 MG/ML SOLN
100.0000 mL | Freq: Once | INTRAVENOUS | Status: AC | PRN
  Administered 2022-11-16: 100 mL via INTRAVENOUS

## 2022-11-16 MED ORDER — PROSOURCE TF20 ENFIT COMPATIBL EN LIQD
60.0000 mL | Freq: Every day | ENTERAL | Status: DC
  Administered 2022-11-16 – 2022-11-17 (×2): 60 mL
  Filled 2022-11-16 (×2): qty 60

## 2022-11-16 MED ORDER — SUCRALFATE 1 GM/10ML PO SUSP
1.0000 g | Freq: Two times a day (BID) | ORAL | Status: DC
  Administered 2022-11-16 – 2022-11-17 (×3): 1 g
  Filled 2022-11-16 (×6): qty 10

## 2022-11-16 MED ORDER — ORAL CARE MOUTH RINSE
15.0000 mL | OROMUCOSAL | Status: DC
  Administered 2022-11-16 – 2022-11-18 (×25): 15 mL via OROMUCOSAL

## 2022-11-16 MED ORDER — OSELTAMIVIR PHOSPHATE 75 MG PO CAPS
75.0000 mg | ORAL_CAPSULE | Freq: Two times a day (BID) | ORAL | Status: AC
  Administered 2022-11-16 – 2022-11-17 (×3): 75 mg
  Filled 2022-11-16 (×5): qty 1

## 2022-11-16 MED ORDER — TRAZODONE HCL 50 MG PO TABS: 50.0000 mg | ORAL_TABLET | Freq: Every evening | ORAL | Status: DC | PRN

## 2022-11-16 MED ORDER — ORAL CARE MOUTH RINSE: 15.0000 mL | OROMUCOSAL | Status: DC | PRN

## 2022-11-16 MED ORDER — FENTANYL CITRATE PF 50 MCG/ML IJ SOSY
50.0000 ug | PREFILLED_SYRINGE | INTRAMUSCULAR | Status: DC | PRN
  Administered 2022-11-16: 100 ug via INTRAVENOUS
  Filled 2022-11-16: qty 2

## 2022-11-16 MED ORDER — IPRATROPIUM-ALBUTEROL 0.5-2.5 (3) MG/3ML IN SOLN
3.0000 mL | Freq: Four times a day (QID) | RESPIRATORY_TRACT | Status: DC | PRN
  Administered 2022-11-17: 3 mL via RESPIRATORY_TRACT
  Filled 2022-11-16: qty 3

## 2022-11-16 MED ORDER — POLYETHYLENE GLYCOL 3350 17 G PO PACK
17.0000 g | PACK | Freq: Every day | ORAL | Status: DC
  Administered 2022-11-16 – 2022-11-17 (×2): 17 g
  Filled 2022-11-16 (×2): qty 1

## 2022-11-16 MED ORDER — DOCUSATE SODIUM 50 MG/5ML PO LIQD
100.0000 mg | Freq: Two times a day (BID) | ORAL | Status: DC
  Administered 2022-11-16 – 2022-11-17 (×3): 100 mg
  Filled 2022-11-16 (×3): qty 10

## 2022-11-16 MED ORDER — SODIUM PHOSPHATES 45 MMOLE/15ML IV SOLN
30.0000 mmol | Freq: Once | INTRAVENOUS | Status: AC
  Administered 2022-11-16: 30 mmol via INTRAVENOUS
  Filled 2022-11-16: qty 10

## 2022-11-16 NOTE — Progress Notes (Signed)
This nurse notified by nurse technician that patient was complaining about not being able to move left hand. Charge nurse Azzie Almas RN and this nurse went into patients room. Patient is stated she can't move her left hand. Patient was alert and oriented x 4, patient could squeeze nurses fingers. Patient stated that there was no numbness, tingling, or pain in left arm. Patient reported no chest pain. Patients smile is symmetrical, no facial drooping, and no slurred speech. MD paged, no new orders were given.

## 2022-11-16 NOTE — Progress Notes (Signed)
[facility] RN Report to floor given  Gave report to  [employer]. Room: 4N16   Discussed surgery, meds given in OR and Pacu, VS, IV fluids given, EBL, urine output, pain and other pertinent information. Also discussed if pt had any family or friends here or belongings with them.   Pt is vented on PRVC TV420, R18, changed to 30, P10, Fio2 100%. Pt is not breathing over vent.  Unable to do NIHSS r/t pt being sedated and Nimbex was still in her system, no movement noted.  VSS, Aline is approx 10-20 pt above Bp. Pts temp was 96.5, used warm blankets.  R groin site remained level 0, no bleeding or hematoma noted. [place] remain diminished lower lobes bilaterally, slight crackles, chest xrays have gotten progressively worse.   Belongings brought to 4N16 from Pacu. Sent text message to her daughter Randell Patient with update.   Pt exits my care.

## 2022-11-16 NOTE — Progress Notes (Signed)
[facility] Progress Note Patient Name: Melinda [name] DOB: 02/19/1956 MRN: [id number]   Date of Service  11/16/2022  HPI/Events of Note  Patient originally admitted for influenza infection, then developed left sided weakness while on the Hospitalist service so stroke imaging performed and confirmed right MCA territory occlusion with infarction, patient transferred from [facility] to [facility], had IR thrombectomy, then admitted to the ICU intubated and sedated post-procedure.  eICU Interventions  New Patient Evaluation.        Kerry Kass Shatyra Becka 11/16/2022, 3:17 AM

## 2022-11-16 NOTE — Progress Notes (Signed)
Transported Pt to CT and back with RN. No complications.

## 2022-11-16 NOTE — Progress Notes (Signed)
SLP Cancellation Note  Patient Details Name: Melinda [name] MRN: [id number] DOB: 1956/08/08   Cancelled treatment:       Reason Eval/Treat Not Completed: Patient not medically ready (on vent). Will f/u as able.     Osie Bond., M.A. [place] Office [phone]  Secure chat preferred  11/16/2022, 7:53 AM

## 2022-11-16 NOTE — Progress Notes (Signed)
Lower extremity venous duplex has been completed.   Preliminary results in CV Proc.   Melinda [name] 11/16/2022 11:51 AM

## 2022-11-16 NOTE — Progress Notes (Signed)
Updated patient's daughter Randell Patient over the phone  She also had a discussion with Dr. 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  Abrupt occlusion of the P2 segment of the popliteal artery extending into the origin of the anterior tibial artery  On vent secondary to influenza pneumonia, community-acquired pneumonia  Continue lines of care, she will be coming in with a sister for further discussions  Resuscitation status changed to DNR  Will continue to follow

## 2022-11-16 NOTE — Progress Notes (Signed)
  Echocardiogram 2D Echocardiogram has been performed.  Melinda [name] 11/16/2022, 10:42 AM

## 2022-11-16 NOTE — Progress Notes (Signed)
OT Cancellation Note  Patient Details Name: Melinda [name] MRN: [id number] DOB: 18-May-1956   Cancelled Treatment:    Reason Eval/Treat Not Completed: Patient not medically ready (discussed with nsg. Will assess when medically appropriate.)  [name],HILLARY 11/16/2022, 7:45 AM Maurie Boettcher, OT/L   Acute OT Clinical Specialist Acute Rehabilitation Services Pager [phone] Office [phone]

## 2022-11-16 NOTE — Progress Notes (Signed)
CT head with midline shift. Right lower extremity with revascularization would require heparin.  This would magnify her current shift.  Unfortunately, after talking with Dr. Leonie Man, current clinical status does not appear to be a survivable. Will not offer right lower extremity revascularization. Recommend comfort care   Broadus John MD

## 2022-11-16 NOTE — Progress Notes (Signed)
[facility] Progress Note Patient Name: Melinda [name] DOB: 04-Aug-1956 MRN: [id number]   Date of Service  11/16/2022  HPI/Events of Note  Patient attempting to self-extubate.  eICU Interventions  Bilateral soft wrist restraints ordered.        Frederik Pear 11/16/2022, 6:33 AM

## 2022-11-16 NOTE — Progress Notes (Addendum)
STROKE TEAM PROGRESS NOTE   INTERVAL HISTORY No family at the bedside. CT Head demonstrates large R MCA and R PCA stroke along with a smaller R Cerebellar stroke with some petechial hemorrhage. Hold lovenox for today. MRI pending. Hypertonic saline initiated. Sedated with propofol and PRN fentanyl.   Update: 1246- Right lower extremity pulse non dopplerable, VVS consulted- CTA abd/pelvis with right LE runoff ordered. Vascular surgery initially recommending revascularization however this would also require heparin infusion.  Repeat CT shows massive right MCA infarct with cytotoxic edema and midline shift.  Vascular surgery feel revascularization would be futile and recommend comfort care.  Spoke to patient's daughter over the phone and explained poor prognosis and we agree with DNR do not escalate care.  Discussed with critical care team.  Vitals:   11/16/22 0630 11/16/22 0645 11/16/22 0700 11/16/22 0715  BP:   (!) 104/57 107/76  Pulse: 69 69 68 68  Resp: (!) 27 (!) 27 (!) 32 (!) 24  Temp: 97.9 F (36.6 C) 97.9 F (36.6 C) 97.7 F (36.5 C) 97.7 F (36.5 C)  TempSrc:      SpO2: 97% 98% 98% 98%  Weight:      Height:       CBC:  Recent Labs  Lab 11/12/2022 0534 11/02/2022 1956 11/05/2022 0603 11/16/22 0143 11/16/22 0318 11/16/22 0337  WBC 9.2   < > 13.8*  --   --  9.2  NEUTROABS 7.6  --   --   --   --   --   HGB 14.2   < > 12.1   < > 10.2* 10.6*  HCT 41.9   < > 36.5   < > 30.0* 32.4*  MCV 88.8   < > 91.7  --   --  93.4  PLT 174   < > 230  --   --  232   < > = values in this interval not displayed.   Basic Metabolic Panel:  Recent Labs  Lab 11/04/2022 0603 11/16/22 0143 11/16/22 0318 11/16/22 0337  NA 136   < > 139 137  K 3.8   < > 4.1 4.1  CL 99  --   --  104  CO2 26  --   --  27  GLUCOSE 156*  --   --  158*  BUN 6*  --   --  5*  CREATININE 0.58  --   --  0.54  CALCIUM 8.3*  --   --  7.6*  MG 2.3  --   --  2.1  PHOS  --   --   --  4.0   < > = values in this interval not  displayed.   Lipid Panel:  Recent Labs  Lab 11/16/22 0337  CHOL 153  TRIG 330*  HDL 22*  CHOLHDL 7.0  VLDL 66*  LDLCALC 65   HgbA1c:  Recent Labs  Lab 11/16/22 0337  HGBA1C 5.9*   Urine Drug Screen: No results for input(s): "LABOPIA", "COCAINSCRNUR", "LABBENZ", "AMPHETMU", "THCU", "LABBARB" in the last 168 hours.  Alcohol Level No results for input(s): "ETH" in the last 168 hours.  IMAGING past 24 hours CT HEAD WO CONTRAST (5MM)  Result Date: 11/16/2022 CLINICAL DATA:  Stroke suspected EXAM: CT HEAD WITHOUT CONTRAST TECHNIQUE: Contiguous axial images were obtained from the base of the skull through the vertex without intravenous contrast. RADIATION DOSE REDUCTION: This exam was performed according to the departmental dose-optimization program which includes automated exposure control, adjustment of the  mA and/or kV according to patient size and/or use of iterative reconstruction technique. COMPARISON:  Flat panel CT from earlier the same day. Head CT from yesterday. FINDINGS: Brain: Large established infarct in the right MCA and PCA cortex. Smaller acute infarct in the right cerebellum. Subarachnoid contrast and gas (which is likely intravascular) has diminished. Mild contrast staining or petechial hemorrhage at the right basal ganglia. Vascular: Not well assessed after recent contrast Skull: Normal. Negative for fracture or focal lesion. Sinuses/Orbits: No acute finding. IMPRESSION: 1. Established acute infarct involving most of the right MCA/PCA cortex. Smaller acute infarct in the right cerebellum. 2. Petechial hemorrhage or contrast staining at the right basal ganglia; subarachnoid contrast and intravascular gas is diminished from procedural CT. Electronically Signed   By: Jonathan  Watts M.D.   On: 11/16/2022 04:16   DG Abd Portable 1V  Result [id number] [id number] oiL EASUREM EST 1 VIEW COMPARISON:  01/17/206[place] [id number] XTTAG>>CHNIQUE: Contiguous axial images were obtained from the base of the skull through the vertex without intravenous contrast. Multidetector CT imaging of the head and neck was performed using the standard protocol during bolus administration of intravenous contrast. Multiplanar CT image reconstructions and MIPs were obtained to evaluate the vascular anatomy. Carotid stenosis measurements (when applicable) are obtained utilizing NASCET criteria, using the distal internal carotid diameter as the denominator. Multiphase CT imaging of the brain was performed following IV bolus contrast injection. Subsequent parametric perfusion maps were calculated using RAPID software. RADIATION DOSE REDUCTION: This exam was performed according to the departmental dose-optimization program which includes automated exposure control, adjustment of the mA and/or1228 Judi1940 Judie PetF295Lauret<MEASURE1(830)Judie [id number] HeiSebasticook33 Blue SprSShacklNovant Health Prince [facility] Me INDINGS Brain: Hypodensity in the right anterior  temporal lobe, including the insula (series 2, images 12-13). There is also possible loss of gray-white differentiation in the right occipital lobe (series 2, image 16). It no evidence of acute hemorrhage, mass, mass effect, or midline shift. No hydrocephalus or extra-axial collection. Vascular: Suspect a hyperdense right MCA (series 7, images 25-26). Skull: Negative for fracture or focal lesion. Sinuses/Orbits: No acute finding. Other: The mastoid air cells are well aerated. ASPECTS ([place] Stroke Program Early CT Score) - Ganglionic level infarction (caudate, lentiform nuclei, internal capsule, insula, M1-M3 cortex): 5 - Supraganglionic infarction (M4-M6 cortex): 3 Total score (0-10 with 10 being normal): 8 CTA NECK FINDINGS Evaluation is significantly limited by motion. Aortic arch: Standard branching. Imaged portion shows no evidence of aneurysm or dissection. No  significant stenosis of the major arch vessel origins. Right carotid system: Grossly patent, however the distal right ICA is not well seen due to motion. Left carotid system: Grossly patent, however the distal left ICAs not well seen due to motion. Vertebral arteries: Patent V1 and V2 segments. The V3 segments are not well evaluated due to motion. Skeleton: No acute osseous abnormality.  Status post ACDF C5-C7 Other neck: Motion limited but no acute finding. Upper chest: Motion limited but diffuse nodular and ground-glass opacities. Review of the MIP images confirms the above findings CTA HEAD FINDINGS Evaluation is significantly limited by motion. Anterior circulation: Both internal carotid arteries are patent to the termini, without significant stenosis. A1 segments patent. Normal anterior communicating artery. Anterior cerebral arteries are patent proximally, with limited evaluation of the distal vessels. Occlusion of the proximal right M1 (series 8, image 273), with minimal opacification of the remainder of the right MCA territory. No left M1  stenosis or occlusion. Left MCA branches are grossly perfused. Posterior circulation: The vertebral arteries cannot be evaluated due to motion. Basilar patent to its distal aspect. Superior cerebellar arteries or not well seen. Patent P1 segments. Near fetal origin of the right PCA for prominent right posterior communicating artery. The right PCA is not seeing past the proximal P2 segment (series 8, image 266-270). The left PCA is poorly visualized due to motion but appears patent proximally. Venous sinuses: As permitted by contrast timing, patent. Anatomic variants: Near fetal origin of the right PCA. Review of the MIP images confirms the above findings CT BRAIN PERFUSION FINDINGS: ASPECTS: 8 CBF (<30%) Volume: 149mL Perfusion (Tmax>6.0s) volume: 153mL Mismatch Volume: 53mL Infarction Location:Right MCA territory. IMPRESSION: 1. The CTA is significantly limited by motion artifact within this limitation, there is hypodensity in the right anterior temporal lobe on the noncontrast CT head, including the insula, concerning for acute infarct, with right M1 occlusion on CTA and CT perfusion consistent with right MCA territory infarct, with 147 mL infarct core and 42 mL mismatch volume. 2. Possible loss of gray-white differentiation in the right occipital lobe, with likely occlusion of the right P2 segment. 3. Evaluation of the distal ICAs, vertebral arteries, superior cerebellar arteries, and left PCA is significantly limited by motion. This study is essentially nondiagnostic for additional stenosis. 4. Motion limited evaluation of the lungs, with diffuse nodular and ground-glass opacities, which could be infectious or inflammatory. Code stroke imaging results involving the R MCA were communicated on 11/02/2022 at 8:32 pm to provider Sherri Rad via telephone, who verbally acknowledged these results. Additional findings were also discussed by telephone on 11/17/2022 at 8:56 pm with provider ABIGAIL CHAVEZ . Electronically  Signed   By: Merilyn Baba M.D.   On: 11/16/2022 21:04   CT ANGIO HEAD NECK W WO CM W PERF (CODE STROKE)  Result Date: 11/23/2022 CLINICAL DATA:  Stroke suspected EXAM: CT HEAD WITHOUT CONTRAST CT ANGIOGRAPHY HEAD AND NECK CT PERFUSION TECHNIQUE: Contiguous axial images were obtained from the base of the skull through the vertex without intravenous contrast. Multidetector CT imaging of the head and neck was performed using the standard protocol during bolus administration of intravenous contrast. Multiplanar CT image reconstructions and MIPs were obtained to evaluate the vascular anatomy. Carotid stenosis measurements (when applicable) are obtained utilizing NASCET criteria, using the distal internal carotid diameter as the denominator. Multiphase CT imaging of the brain was performed following IV bolus contrast injection. Subsequent parametric perfusion maps were calculated using RAPID software. RADIATION DOSE REDUCTION: This exam  was performed according to the departmental dose-optimization program which includes automated exposure control, adjustment of the mA and/or kV according to patient size and/or use of iterative reconstruction technique. CONTRAST:  OMNIPAQUE IOHEXOL 350 MG/ML SOLN COMPARISON:  CT head 09/13/2017, no prior CTA FINDINGS: CT HEAD FINDINGS Brain: Hypodensity in the right anterior temporal lobe, including the insula (series 2, images 12-13). There is also possible loss of gray-white differentiation in the right occipital lobe (series 2, image 16). It no evidence of acute hemorrhage, mass, mass effect, or midline shift. No hydrocephalus or extra-axial collection. Vascular: Suspect a hyperdense right MCA (series 7, images 25-26). Skull: Negative for fracture or focal lesion. Sinuses/Orbits: No acute finding. Other: The mastoid air cells are well aerated. ASPECTS [facility] Stroke Program Early CT Score) - Ganglionic level infarction (caudate, lentiform nuclei, internal capsule, insula,  M1-M3 cortex): 5 - Supraganglionic infarction (M4-M6 cortex): 3 Total score (0-10 with 10 being normal): 8 CTA NECK FINDINGS Evaluation is significantly limited by motion. Aortic arch: Standard branching. Imaged portion shows no evidence of aneurysm or dissection. No significant stenosis of the major arch vessel origins. Right carotid system: Grossly patent, however the distal right ICA is not well seen due to motion. Left carotid system: Grossly patent, however the distal left ICAs not well seen due to motion. Vertebral arteries: Patent V1 and V2 segments. The V3 segments are not well evaluated due to motion. Skeleton: No acute osseous abnormality.  Status post ACDF C5-C7 Other neck: Motion limited but no acute finding. Upper chest: Motion limited but diffuse nodular and ground-glass opacities. Review of the MIP images confirms the above findings CTA HEAD FINDINGS Evaluation is significantly limited by motion. Anterior circulation: Both internal carotid arteries are patent to the termini, without significant stenosis. A1 segments patent. Normal anterior communicating artery. Anterior cerebral arteries are patent proximally, with limited evaluation of the distal vessels. Occlusion of the proximal right M1 (series 8, image 273), with minimal opacification of the remainder of the right MCA territory. No left M1 stenosis or occlusion. Left MCA branches are grossly perfused. Posterior circulation: The vertebral arteries cannot be evaluated due to motion. Basilar patent to its distal aspect. Superior cerebellar arteries or not well seen. Patent P1 segments. Near fetal origin of the right PCA for prominent right posterior communicating artery. The right PCA is not seeing past the proximal P2 segment (series 8, image 266-270). The left PCA is poorly visualized due to motion but appears patent proximally. Venous sinuses: As permitted by contrast timing, patent. Anatomic variants: Near fetal origin of the right PCA. Review  of the MIP images confirms the above findings CT BRAIN PERFUSION FINDINGS: ASPECTS: 8 CBF (<30%) Volume: Perfusion (Tmax>6.0s) volume: Mismatch Volume: 27mL Infarction Location:Right MCA territory. IMPRESSION: 1. The CTA is significantly limited by motion artifact within this limitation, there is hypodensity in the right anterior temporal lobe on the noncontrast CT head, including the insula, concerning for acute infarct, with right M1 occlusion on CTA and CT perfusion consistent with right MCA territory infarct, with 147 mL infarct core and 42 mL mismatch volume. 2. Possible loss of gray-white differentiation in the right occipital lobe, with likely occlusion of the right P2 segment. 3. Evaluation of the distal ICAs, vertebral arteries, superior cerebellar arteries, and left PCA is significantly limited by motion. This study is essentially nondiagnostic for additional stenosis. 4. Motion limited evaluation of the lungs, with diffuse nodular and ground-glass opacities, which could be infectious or inflammatory. Code stroke imaging results  involving the R MCA were communicated on 11/01/2022 at 8:32 pm to provider Jacky Kindle via telephone, who verbally acknowledged these results. Additional findings were also discussed by telephone on 11/17/2022 at 8:56 pm with provider ABIGAIL CHAVEZ . Electronically Signed   By: Wiliam Ke M.D.   On: 11/11/2022 21:04   DG Chest Port 1 View  Result Date: 11/06/2022 CLINICAL DATA:  Acute respiratory distress. EXAM: PORTABLE CHEST 1 VIEW COMPARISON:  11/21/2022. FINDINGS: The heart size and mediastinal contours are stable. There is marked interval worsening of diffuse airspace opacities in the lungs bilaterally. No effusion or pneumothorax. No acute osseous abnormality. IMPRESSION: Marked interval worsening of diffuse airspace opacities in the lungs bilaterally, possible edema or infiltrate. Electronically Signed   By: Thornell Sartorius M.D.   On: 11/02/2022 20:55     PHYSICAL EXAM  Constitutional: Middle-age Caucasian lady is sedated and intubated cardiovascular: Normal rate and regular rhythm.  Respiratory: Mechanically ventilated, effort normal, non-labored breathing  Neuro: Mental Status: Intubated, sedated with propofol.  Eyes are closed.  Aphasic.  And not following any commands. Cranial Nerves: PERRL 37mm and sluggish, eyes are midline. Head to the right. Cough, gag, corneals intact Motor/Sensory: Withdraws to pain briskly on the right. Slower on the left lower extremity. No movement noted in left upper extremity    ASSESSMENT/PLAN Ms. Melinda [name] is a 67 y.o. female with history of GERD, tobacco use, anxiety, who is admitted at [facility] for hypoxic respiratory failure and influenza A positive presenting with dysarthria and left-sided weakness. She was initially admitted to [facility].  Yesterday she developed slurred speech and left-sided weakness.  A code stroke was subsequently called and she was found to have a right MCA occlusion.  She was transferred to [facility] for an emergent thrombectomy.  She is status post revascularization of the right MCA with tiki 2C revascularization.  Required manual compression of her puncture site.  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  Vascular surgery has been consulted  Stroke:  Right MCA infarct s/p mechanical thrombectomy with TICI2c revascularization Etiology: right MCA occlusion   Code Stroke CT head hypodensity in the right anterior temporal lobe, ASPECTS 8 CTA head & neck Right M1 occlusion CT perfusion consistent with right MCA territory infarct, with 147 mL infarct core and 42 mL mismatch volume. 1/18 - 0416 CT Head Established acute infarct involving most of the right MCA/PCA cortex. Smaller acute infarct in the right cerebellum. Petechial hemorrhage or contrast staining at the right basal ganglia Post IR CT  large evolving early infarct of the right MCA distribution with petechial hemorrhage, mild right perisylvian subarachnoid hyperattenuation MRI pending 2D Echo EF is 65-70%, left atrial size moderately dilated LDL 65 HgbA1c 5.9 VTE prophylaxis -SCDs    Diet   Diet NPO time specified   No antithrombotic prior to admission, now on No antithrombotic.  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 MD to update note as needed.   Melinda Picker, DNP, FNP-BC [facility] Pager: [phone] STROKE MD NOTE :  I have personally obtained history,examined this patient, reviewed notes, independently viewed imaging studies, participated in medical decision making and plan of care.ROS completed by me personally and pertinent positives fully documented  I have made any additions or clarifications directly to the above note. Agree with note above.  Patient presented with large right MCA infarct and underwent thrombectomy with partial revascularization and CT scan shows large right MCA infarct with already early cytotoxic edema and midline shift.  Prognosis seems quite poor.  She is also developed right lower extremity ischemia  and surgical revascularization required using IV heparin which is likely to increase chances of hemorrhagic transformation.  Unfortunately we do not have good choices here.  I spoke to both of patient's daughters over the phone and explained poor prognosis.  They agree with DNR and not escalating care further.  Will continue current medication and treatment till they arrive.  Would likely recommend comfort care and passionate extubation if family is agreeable.  Discussed with Dr. Wynona Neat critical care medicine and Dr. Sherral Hammers vascular surgery.This patient is critically ill and at significant risk of neurological worsening, death and care requires constant monitoring of vital signs, hemodynamics,respiratory and cardiac monitoring, extensive review of multiple databases, frequent neurological assessment, discussion with family, other specialists and medical decision making of high complexity.I have made any additions or clarifications directly to the above note.This critical care time does not reflect procedure time, or teaching time or supervisory time of PA/NP/Med Resident etc but could involve care discussion time.  I spent 60 minutes of neurocritical care time  in the care of  this patient.      Melinda Heady, MD Medical Director [facility] Pager: [phone] 11/16/2022 5:41 PM  To contact Stroke Continuity provider, please refer to [email or web address]. After hours, contact General Neurology

## 2022-11-16 NOTE — Transfer of Care (Signed)
Immediate Anesthesia Transfer of Care Note  Patient: Melinda [name] Procedure(s) Performed: IR WITH ANESTHESIA  Patient Location: [name]  Anesthesia Type:General  Level of Consciousness: Patient remains intubated per anesthesia plan  Airway & Oxygen Therapy: Patient remains intubated per anesthesia plan and Patient placed on Ventilator (see vital sign flow sheet for setting)  Post-op Assessment: Report given to RN and Post -op Vital signs reviewed and stable  Post vital signs: Reviewed and stable  Last Vitals:  Vitals Value Taken Time  BP    Temp    Pulse 81 11/16/22 0105  Resp 18 11/16/22 0105  SpO2 98 % 11/16/22 0105  Vitals shown include unvalidated device data.  Last Pain:  Vitals:   11/04/2022 2106  TempSrc: Axillary  PainSc:       Patients Stated Pain Goal: 0 (31/51/76 1607)  Complications: No notable events documented.

## 2022-11-16 NOTE — Progress Notes (Signed)
PT Cancellation Note  Patient Details Name: KYANNAH CLIMER MRN: [id number] DOB: [birth year]   Cancelled Treatment:    Reason Eval/Treat Not Completed: Patient not medically ready.  Per RN, definitely not ready for therapy.  Will check back tomorrow 1/19. 11/16/2022  Ginger Carne., PT Acute Rehabilitation Services [phone]  (office)   Tessie Fass Tyrann Donaho 11/16/2022, 10:04 AM

## 2022-11-16 NOTE — Progress Notes (Signed)
NAME:  Melinda [name], MRN:  [id number], DOB:  January 01, 1956, LOS: 2 ADMISSION DATE:  11/12/2022, CONSULTATION DATE:  11/16/2022 REFERRING MD:  Corliss Skains, CHIEF COMPLAINT: Left-sided weakness  History of Present Illness:  67 y/o female admitted on November 13, 2022 in the context of cough, vomiting.  Found to be flu a positive.  Admitted by the hospitalist, treated with 2 L nasal cannula, Tamiflu.  On January 30, 2024patient was noted to have new onset left-sided weakness.  During the day on 01/30/24she ambulated with assistance in the hallway and required 5 L nasal cannula oxygen.  This is around 1:30 in the afternoon.  Code stroke was called at 8 PM when she was noted to have left-sided weakness and confusion.  She had been last seen normal at 530pm.  Telemetry neuro was consulted (the patient was admitted to [facility]) and further head imaging was ordered including a CT scan, CT angiogram, and CT perfusion study.  This showed a right M1 and P2 occlusion of right MCA, area of infarct in right MCA territory noted.  Study was noted to be motion degraded.  Interventional radiology was consulted and she was transferred from [facility] to [facility] where she underwent a thrombectomy the evening of Nov 28, 2022.  The patient was intubated and sedated for the procedure, pulmonary and critical care medicine was consulted for ventilator management.   Pertinent  Medical History   Past Medical History:  Diagnosis Date   Reflux    Sprain of neck 11/24/2013   Significant Hospital Events: Including procedures, antibiotic start and stop dates in addition to other pertinent events   2022/11/28 Status post revascularization of the right middle cerebral artery distribution with 2 passes with complex aspiration, and 1 pass with contact aspiration- achieving aTICI 2C revascularization.   Interim History / Subjective:  Patient on vent, not following commands, moving right upper and lower  extremity  Objective   Blood pressure 113/63, pulse 73, temperature 97.7 F (36.5 C), resp. rate (!) 38, height 5\' 3"  (1.6 m), weight 72.3 kg, SpO2 98 %.    Vent Mode: PRVC FiO2 (%):  [70 %-100 %] 80 % Set Rate:  [18 bmp-30 bmp] 30 bmp Vt Set:  [420 mL] 420 mL PEEP:  [10 cmH20] 10 cmH20 Plateau Pressure:  [25 cmH20-29 cmH20] 29 cmH20   Intake/Output Summary (Last 24 hours) at 11/16/2022 0830 Last data filed at 11/16/2022 0700 Gross per 24 hour  Intake 2774.59 ml  Output 425 ml  Net 2349.59 ml   Filed Weights   11/10/2022 0508 2022/11/28 2106  Weight: 70.4 kg 72.3 kg    Examination: General: Middle-age, does not appear to be in distress HENT: Moist oral mucosa, endotracheal tube in place Lungs: Rales at the bases, occasional rhonchi Cardiovascular: S1-S2 appreciated Abdomen: Soft, bowel sounds appreciated Extremities: No clubbing, no edema Neuro: Not following commands, moving right upper and lower extremity GU:   Chest x-ray reviewed by myself showing severe bilateral infiltrative process  CT head reviewed showing right MCA/PCA cortex infarct, right cerebellum infarct  Resolved Hospital Problem list     Assessment & Plan:  Acute right MCA stroke s/p thrombectomy CT with extensive changes on the right -To have MRI today -Stroke attending following -Goal blood pressure 120-140 per neuro IR  Acute hypoxemic respiratory failure secondary to influenza pneumonia complicated by recent stroke Concern for ARDS -Continue full mechanical ventilation -Maintain current tidal volumes -Plateau pressure less than 30 -Goal driving pressure  less than 15 -VAP prevention -Daily WUA/SBT  Influenza A pneumonia -Continue Tamiflu -Follow respiratory cultures -Continue empiric antibiotics including ceftriaxone and azithromycin  History of GERD -Continue famotidine  Sedation and mechanical ventilator patient -Goal RASS -1 -Continue as needed fentanyl -Continue propofol  infusion  Best Practice (right click and "Reselect all SmartList Selections" daily)   Diet/type: tubefeeds DVT prophylaxis: SCD GI prophylaxis: PPI Lines: N/A Foley:  N/A Code Status:  full code Last date of multidisciplinary goals of care discussion [on admission 12-10-2022]  Labs   CBC: Recent Labs  Lab 11/14/2022 0534 11/01/2022 1956 11/14/22 0534 Dec 10, 2022 0603 11/16/22 0143 11/16/22 0318 11/16/22 0337  WBC 9.2 10.7* 10.1 13.8*  --   --  9.2  NEUTROABS 7.6  --   --   --   --   --   --   HGB 14.2 13.5 12.2 12.1 12.2 10.2* 10.6*  HCT 41.9 41.0 37.1 36.5 36.0 30.0* 32.4*  MCV 88.8 92.3 91.8 91.7  --   --  93.4  PLT 174 176 174 230  --   --  829    Basic Metabolic Panel: Recent Labs  Lab 11/05/2022 0534 11/22/2022 1956 11/14/22 0534 2022-12-10 0603 11/16/22 0143 11/16/22 0318 11/16/22 0337  NA 132*  --  138 136 140 139 137  K 4.0  --  4.3 3.8 3.9 4.1 4.1  CL 95*  --  103 99  --   --  104  CO2 26  --  28 26  --   --  27  GLUCOSE 135*  --  104* 156*  --   --  158*  BUN 13  --  9 6*  --   --  5*  CREATININE 0.88 0.80 0.73 0.58  --   --  0.54  CALCIUM 8.4*  --  8.1* 8.3*  --   --  7.6*  MG  --   --   --  2.3  --   --  2.1  PHOS  --   --   --   --   --   --  4.0   GFR: Estimated Creatinine Clearance: 66.8 mL/min (by C-G formula based on SCr of 0.54 mg/dL). Recent Labs  Lab 11/12/2022 1956 11/14/22 0534 11/14/22 0814 2022/12/10 0603 11/16/22 0337  PROCALCITON  --   --  0.32 0.17 0.12  WBC 10.7* 10.1  --  13.8* 9.2    Liver Function Tests: Recent Labs  Lab 11/22/2022 0534 11/14/22 0534  AST 89* 56*  ALT 53* 48*  ALKPHOS 97 77  BILITOT 0.6 0.7  PROT 7.3 5.8*  ALBUMIN 2.9* 2.2*   No results for input(s): "LIPASE", "AMYLASE" in the last 168 hours. No results for input(s): "AMMONIA" in the last 168 hours.  ABG    Component Value Date/Time   PHART 7.290 (L) 11/16/2022 0318   PCO2ART 57.8 (H) 11/16/2022 0318   PO2ART 164 (H) 11/16/2022 0318   HCO3 28.6 (H)  11/16/2022 0318   TCO2 31 11/16/2022 0318   ACIDBASEDEF 2.0 11/16/2022 0143   O2SAT 99 11/16/2022 0318     Coagulation Profile: No results for input(s): "INR", "PROTIME" in the last 168 hours.  Cardiac Enzymes: No results for input(s): "CKTOTAL", "CKMB", "CKMBINDEX", "TROPONINI" in the last 168 hours.  HbA1C: Hgb A1c MFr Bld  Date/Time Value Ref Range Status  11/16/2022 03:37 AM 5.9 (H) 4.8 - 5.6 % Final    Comment:    (NOTE) Pre diabetes:  5.7%-6.4%  Diabetes:              >6.4%  Glycemic control for   <7.0% adults with diabetes     CBG: Recent Labs  Lab 11/02/2022 1944 11/19/2022 2108  GLUCAP 155* 146*    Review of Systems:   Unable to provide  Past Medical History:  She,  has a past medical history of Reflux and Sprain of neck (11/24/2013).   Surgical History:   Past Surgical History:  Procedure Laterality Date   ABDOMINAL HYSTERECTOMY     BREAST BIOPSY Right 02/03/2013   stereotactic biopsy negative   BREAST BIOPSY Left 2006   negative   BREAST BIOPSY Left 07/14/2009   negative with clip   BREAST CYST ASPIRATION Left 2006   negative   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   CERVICAL FUSION     CESAREAN SECTION     ESOPHAGEAL DILATION       Social History:   reports that she has quit smoking. Her smoking use included cigarettes. She has never used smokeless tobacco. She reports that she does not drink alcohol and does not use drugs.   Family History:  Her family history includes Breast cancer in her cousin, cousin, maternal aunt, and maternal grandmother; Diabetes in her father.   Allergies Allergies  Allergen Reactions   Aspirin Anaphylaxis   Succinylcholine Anaphylaxis    coma   Tramadol Itching   Tizanidine Anxiety    The patient is critically ill with multiple organ systems failure and requires high complexity decision making for assessment and support, frequent evaluation and titration of therapies, application of advanced monitoring  technologies and extensive interpretation of multiple databases. Critical Care Time devoted to patient care services described in this note independent of APP/resident time (if applicable)  is 32 minutes.   Sherrilyn Rist MD [facility] Personal pager: See Amion If unanswered, please page CCM On-call: [phone]

## 2022-11-16 NOTE — Progress Notes (Signed)
Pt transported to CT and back to 2H16 via vent. No complications noted during transport.

## 2022-11-16 NOTE — Progress Notes (Signed)
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 Patient in bed with eyes closed, does not respond to commands.   Allergies: Aspirin, Succinylcholine, Tramadol, and Tizanidine  Medications: Prior to Admission medications   Medication Sig Start Date End Date Taking? Authorizing Provider  Calcium Carbonate Antacid (TUMS PO) Take 2 tablets by mouth 2 (two) times daily as needed (heartburn).   Yes [provider]  omeprazole (PRILOSEC) 20 MG capsule Take 1 capsule (20 mg total) by mouth daily. Patient not taking: Reported on 11/14/2022 01/18/21   Gailen ShelterFondaw, Wylder S, PA     Vital Signs: BP 118/68   Pulse (!) 117   Temp 98.1 F (36.7 C)   Resp (!) 32   Ht 5\' 3"  (1.6 m)   Wt 159 lb 6.3 oz (72.3 kg)   SpO2 97%   BMI 28.24 kg/m   Physical Exam Vitals reviewed.  Constitutional:      Interventions: She is intubated.  Cardiovascular:     Rate and Rhythm: Tachycardia present.  Pulmonary:     Effort: She is intubated.    Right CFA puncture site with mild bruising, soft, unable to assess if tender d/t patient being intubated. Distal pulses are Dopplerable on left lower extremity, non-Dopplerable on right at both posterior tibial site and dorsalis pedis site. Right popliteal pulse is palpable.  Right foot is cool with delayed capillary refill. Right calf is warm.    Imaging: VAS US LOWER EXTREMITY VENOUS (DVT)  Result Date: 11/16/2022  Lower Venous DVT Study Patient Name:  Melinda [name]  Date of Exam:   11/16/2022 Medical Rec #: [id number]        Accession #:    [id number] Date of Birth: 01-19-1956       Patient Gender: F Patient Age:   [age] years [facility] Procedure:      VAS US LOWER EXTREMITY VENOUS (DVT) Referring  Phys: Elmer PickerEVON SHAFER --------------------------------------------------------------------------------  Indications: Stroke.  Comparison Study: no prior Performing Technologist: [place] PonderMegan Stricklin RVS  Examination Guidelines: A complete evaluation includes B-mode imaging, spectral Doppler, color Doppler, and power Doppler as needed of all accessible portions of each vessel. Bilateral testing is considered an integral part of a complete examination. Limited examinations for reoccurring indications may be performed as noted. The reflux portion of the exam is performed with the patient in reverse Trendelenburg.  +---------+---------------+---------+-----------+----------+--------------+ RIGHT    CompressibilityPhasicitySpontaneityPropertiesThrombus Aging +---------+---------------+---------+-----------+----------+--------------+ CFV      Full           Yes      Yes                                 +---------+---------------+---------+-----------+----------+--------------+ SFJ      Full                                                        +---------+---------------+---------+-----------+----------+--------------+ FV Prox  Full                                                        +---------+---------------+---------+-----------+----------+--------------+  FV Mid   Full                                                        +---------+---------------+---------+-----------+----------+--------------+ FV DistalFull                                                        +---------+---------------+---------+-----------+----------+--------------+ PFV      Full                                                        +---------+---------------+---------+-----------+----------+--------------+ POP      Full           Yes      Yes                                 +---------+---------------+---------+-----------+----------+--------------+ PTV      Full                                                         +---------+---------------+---------+-----------+----------+--------------+ PERO     Full                                                        +---------+---------------+---------+-----------+----------+--------------+   +---------+---------------+---------+-----------+----------+--------------+ LEFT     CompressibilityPhasicitySpontaneityPropertiesThrombus Aging +---------+---------------+---------+-----------+----------+--------------+ CFV      Full           Yes      Yes                                 +---------+---------------+---------+-----------+----------+--------------+ SFJ      Full                                                        +---------+---------------+---------+-----------+----------+--------------+ FV Prox  Full                                                        +---------+---------------+---------+-----------+----------+--------------+ FV Mid   Full                                                        +---------+---------------+---------+-----------+----------+--------------+  FV DistalFull                                                        +---------+---------------+---------+-----------+----------+--------------+ PFV      Full                                                        +---------+---------------+---------+-----------+----------+--------------+ POP      Full           Yes      Yes                                 +---------+---------------+---------+-----------+----------+--------------+ PTV      Full                                                        +---------+---------------+---------+-----------+----------+--------------+ PERO     Full                                                        +---------+---------------+---------+-----------+----------+--------------+     Summary: BILATERAL: - No evidence of deep vein thrombosis seen in the lower extremities,  bilaterally. -No evidence of popliteal cyst, bilaterally.   *See table(s) above for measurements and observations.    Preliminary    ECHOCARDIOGRAM COMPLETE  Result Date: 11/16/2022    ECHOCARDIOGRAM REPORT   Patient Name:   Melinda [name] Date of Exam: 11/16/2022 Medical Rec #:  [id number]       Height:       63.0 in Accession #:    [id number]      Weight:       159.4 lb Date of Birth:  05/10/1956      BSA:          1.756 m Patient Age:    66 years        BP:           129/75 mmHg Patient Gender: F               HR:           91 bpm. Exam Location:  Inpatient Procedure: 2D Echo, Cardiac Doppler and Color Doppler Indications:    Stroke I63.9  History:        Patient has no prior history of Echocardiogram examinations.                 Risk Factors:Dyslipidemia and Current Smoker.  Sonographer:    Aron Baba Referring Phys: [id number] [facility]  Sonographer Comments: Echo performed with patient supine and on artificial respirator. Image acquisition challenging due to patient body habitus and Image acquisition challenging due to respiratory motion. IMPRESSIONS  1. Left ventricular ejection fraction, by estimation, is 65 to 70%. The left ventricle  has normal function. The left ventricle has no regional wall motion abnormalities. Indeterminate diastolic filling due to E-A fusion.  2. Right ventricular systolic function is normal. The right ventricular size is normal. There is normal pulmonary artery systolic pressure. The estimated right ventricular systolic pressure is 09.3 mmHg.  3. Left atrial size was moderately dilated.  4. Trivial mitral valve regurgitation.  5. Aortic valve regurgitation is not visualized.  6. The inferior vena cava is dilated in size with >50% respiratory variability, suggesting right atrial pressure of 8 mmHg. Conclusion(s)/Recommendation(s): Normal biventricular function without evidence of hemodynamically significant valvular heart disease. FINDINGS  Left Ventricle: Left ventricular  ejection fraction, by estimation, is 65 to 70%. The left ventricle has normal function. The left ventricle has no regional wall motion abnormalities. The left ventricular internal cavity size was normal in size. There is  no left ventricular hypertrophy. Indeterminate diastolic filling due to E-A fusion. Right Ventricle: The right ventricular size is normal. Right ventricular systolic function is normal. There is normal pulmonary artery systolic pressure. The tricuspid regurgitant velocity is 1.18 m/s, and with an assumed right atrial pressure of 8 mmHg,  the estimated right ventricular systolic pressure is 26.7 mmHg. Left Atrium: Left atrial size was moderately dilated. Right Atrium: Right atrial size was normal in size. Pericardium: There is no evidence of pericardial effusion. Mitral Valve: Mild mitral annular calcification. Trivial mitral valve regurgitation. Tricuspid Valve: Tricuspid valve regurgitation is trivial. Aortic Valve: Aortic valve regurgitation is not visualized. Pulmonic Valve: Pulmonic valve regurgitation is not visualized. Aorta: The aortic root and ascending aorta are structurally normal, with no evidence of dilitation. Venous: The inferior vena cava is dilated in size with greater than 50% respiratory variability, suggesting right atrial pressure of 8 mmHg. IAS/Shunts: The interatrial septum was not well visualized.  LEFT VENTRICLE PLAX 2D LVIDd:         4.20 cm   Diastology LVIDs:         2.60 cm   LV e' medial:    11.50 cm/s LV PW:         1.00 cm   LV E/e' medial:  12.2 LV IVS:        0.80 cm   LV e' lateral:   8.86 cm/s LVOT diam:     1.90 cm   LV E/e' lateral: 15.8 LV SV:         86 LV SV Index:   49 LVOT Area:     2.84 cm  RIGHT VENTRICLE RV S prime:     21.90 cm/s TAPSE (M-mode): 2.8 cm LEFT ATRIUM              Index        RIGHT ATRIUM           Index LA diam:        3.90 cm  2.22 cm/m   RA Area:     15.80 cm LA Vol (A2C):   105.0 ml 59.80 ml/m  RA Volume:   37.10 ml  21.13 ml/m LA  Vol (A4C):   81.9 ml  46.64 ml/m LA Biplane Vol: 94.4 ml  53.76 ml/m  AORTIC VALVE LVOT Vmax:   137.00 cm/s LVOT Vmean:  86.300 cm/s LVOT VTI:    0.302 m  AORTA Ao Root diam: 3.20 cm Ao Asc diam:  3.60 cm MITRAL VALVE                TRICUSPID VALVE MV Area (PHT): 4.80 cm  TR Peak grad:   5.6 mmHg MV Decel Time: 158 msec     TR Vmax:        118.00 cm/s MV E velocity: 140.00 cm/s MV A velocity: 133.00 cm/s  SHUNTS MV E/A ratio:  1.05         Systemic VTI:  0.30 m                             Systemic Diam: 1.90 cm Carolan Clines Electronically signed by Carolan Clines Signature Date/Time: 11/16/2022/11:21:21 AM    Final    CT HEAD WO CONTRAST ( )  Result Date: 11/16/2022 CLINICAL DATA:  Stroke suspected EXAM: CT HEAD WITHOUT CONTRAST TECHNIQUE: Contiguous axial images were obtained from the base of the skull through the vertex without intravenous contrast. RADIATION DOSE REDUCTION: This exam was performed according to the departmental dose-optimization program which includes automated exposure control, adjustment of the mA and/or kV according to patient size and/or use of iterative reconstruction technique. COMPARISON:  Flat panel CT from earlier the same day. Head CT from yesterday. FINDINGS: Brain: Large established infarct in the right MCA and PCA cortex. Smaller acute infarct in the right cerebellum. Subarachnoid contrast and gas (which is likely intravascular) has diminished. Mild contrast staining or petechial hemorrhage at the right basal ganglia. Vascular: Not well assessed after recent contrast Skull: Normal. Negative for fracture or focal lesion. Sinuses/Orbits: No acute finding. IMPRESSION: 1. Established acute infarct involving most of the right MCA/PCA cortex. Smaller acute infarct in the right cerebellum. 2. Petechial hemorrhage or contrast staining at the right basal ganglia; subarachnoid contrast and intravascular gas is diminished from procedural CT. Electronically Signed   By: Tiburcio Pea  M.D.   On: 11/16/2022 04:16   DG Abd Portable 1V  Result Date: 11/16/2022 CLINICAL DATA:  NG tube placement EXAM: PORTABLE ABDOMEN - 1 VIEW COMPARISON:  05/26/2008 FINDINGS: NG tube tip is in the stomach with the side port near the GE junction. IMPRESSION: NG tube in the stomach. Electronically Signed   By: Charlett Nose M.D.   On: 11/16/2022 03:17   DG CHEST PORT 1 VIEW  Result Date: 11/16/2022 CLINICAL DATA:  ET tube, NG tube EXAM: PORTABLE CHEST 1 VIEW COMPARISON:  12/03/22 FINDINGS: Endotracheal tube is 3.4 cm above the carina. NG tube is in the stomach. Severe diffuse bilateral airspace disease with slight improvement since prior study. Heart is normal size. No effusions or acute bony abnormality. IMPRESSION: Support devices as above. Severe diffuse bilateral airspace disease, slightly improved. Electronically Signed   By: Charlett Nose M.D.   On: 11/16/2022 03:16   CT HEAD WO CONTRAST ( )  Result Date: December 03, 2022 CLINICAL DATA:  Stroke suspected EXAM: CT HEAD WITHOUT CONTRAST CT ANGIOGRAPHY HEAD AND NECK CT PERFUSION TECHNIQUE: Contiguous axial images were obtained from the base of the skull through the vertex without intravenous contrast. Multidetector CT imaging of the head and neck was performed using the standard protocol during bolus administration of intravenous contrast. Multiplanar CT image reconstructions and MIPs were obtained to evaluate the vascular anatomy. Carotid stenosis measurements (when applicable) are obtained utilizing NASCET criteria, using the distal internal carotid diameter as the denominator. Multiphase CT imaging of the brain was performed following IV bolus contrast injection. Subsequent parametric perfusion maps were calculated using RAPID software. RADIATION DOSE REDUCTION: This exam was performed according to the departmental dose-optimization program which includes automated exposure control, adjustment of the mA and/or kV according  to patient size and/or use of  iterative reconstruction technique. CONTRAST:  100mL OMNIPAQUE IOHEXOL 350 MG/ML SOLN COMPARISON:  CT head 09/13/2017, no prior CTA FINDINGS: CT HEAD FINDINGS Brain: Hypodensity in the right anterior temporal lobe, including the insula (series 2, images 12-13). There is also possible loss of gray-white differentiation in the right occipital lobe (series 2, image 16). It no evidence of acute hemorrhage, mass, mass effect, or midline shift. No hydrocephalus or extra-axial collection. Vascular: Suspect a hyperdense right MCA (series 7, images 25-26). Skull: Negative for fracture or focal lesion. Sinuses/Orbits: No acute finding. Other: The mastoid air cells are well aerated. ASPECTS [facility]([place] Stroke Program Early CT Score) - Ganglionic level infarction (caudate, lentiform nuclei, internal capsule, insula, M1-M3 cortex): 5 - Supraganglionic infarction (M4-M6 cortex): 3 Total score (0-10 with 10 being normal): 8 CTA NECK FINDINGS Evaluation is significantly limited by motion. Aortic arch: Standard branching. Imaged portion shows no evidence of aneurysm or dissection. No significant stenosis of the major arch vessel origins. Right carotid system: Grossly patent, however the distal right ICA is not well seen due to motion. Left carotid system: Grossly patent, however the distal left ICAs not well seen due to motion. Vertebral arteries: Patent V1 and V2 segments. The V3 segments are not well evaluated due to motion. Skeleton: No acute osseous abnormality.  Status post ACDF C5-C7 Other neck: Motion limited but no acute finding. Upper chest: Motion limited but diffuse nodular and ground-glass opacities. Review of the MIP images confirms the above findings CTA HEAD FINDINGS Evaluation is significantly limited by motion. Anterior circulation: Both internal carotid arteries are patent to the termini, without significant stenosis. A1 segments patent. Normal anterior communicating artery. Anterior cerebral arteries are patent  proximally, with limited evaluation of the distal vessels. Occlusion of the proximal right M1 (series 8, image 273), with minimal opacification of the remainder of the right MCA territory. No left M1 stenosis or occlusion. Left MCA branches are grossly perfused. Posterior circulation: The vertebral arteries cannot be evaluated due to motion. Basilar patent to its distal aspect. Superior cerebellar arteries or not well seen. Patent P1 segments. Near fetal origin of the right PCA for prominent right posterior communicating artery. The right PCA is not seeing past the proximal P2 segment (series 8, image 266-270). The left PCA is poorly visualized due to motion but appears patent proximally. Venous sinuses: As permitted by contrast timing, patent. Anatomic variants: Near fetal origin of the right PCA. Review of the MIP images confirms the above findings CT BRAIN PERFUSION FINDINGS: ASPECTS: 8 CBF (<30%) Volume: 147mL Perfusion (Tmax>6.0s) volume: 189mL Mismatch Volume: 42mL Infarction Location:Right MCA territory. IMPRESSION: 1. The CTA is significantly limited by motion artifact within this limitation, there is hypodensity in the right anterior temporal lobe on the noncontrast CT head, including the insula, concerning for acute infarct, with right M1 occlusion on CTA and CT perfusion consistent with right MCA territory infarct, with 147 mL infarct core and 42 mL mismatch volume. 2. Possible loss of gray-white differentiation in the right occipital lobe, with likely occlusion of the right P2 segment. 3. Evaluation of the distal ICAs, vertebral arteries, superior cerebellar arteries, and left PCA is significantly limited by motion. This study is essentially nondiagnostic for additional stenosis. 4. Motion limited evaluation of the lungs, with diffuse nodular and ground-glass opacities, which could be infectious or inflammatory. Code stroke imaging results involving the R MCA were communicated on 11/12/2022 at 8:32 pm to  provider Jacky KindleWendy Brown via telephone, who verbally  acknowledged these results. Additional findings were also discussed by telephone on 11/01/2022 at 8:56 pm with provider ABIGAIL CHAVEZ . Electronically Signed   By: Merilyn Baba M.D.   On: 11/04/2022 21:04   CT ANGIO HEAD NECK W WO CM W PERF (CODE STROKE)  Result Date: 10/30/2022 CLINICAL DATA:  Stroke suspected EXAM: CT HEAD WITHOUT CONTRAST CT ANGIOGRAPHY HEAD AND NECK CT PERFUSION TECHNIQUE: Contiguous axial images were obtained from the base of the skull through the vertex without intravenous contrast. Multidetector CT imaging of the head and neck was performed using the standard protocol during bolus administration of intravenous contrast. Multiplanar CT image reconstructions and MIPs were obtained to evaluate the vascular anatomy. Carotid stenosis measurements (when applicable) are obtained utilizing NASCET criteria, using the distal internal carotid diameter as the denominator. Multiphase CT imaging of the brain was performed following IV bolus contrast injection. Subsequent parametric perfusion maps were calculated using RAPID software. RADIATION DOSE REDUCTION: This exam was performed according to the departmental dose-optimization program which includes automated exposure control, adjustment of the mA and/or kV according to patient size and/or use of iterative reconstruction technique. CONTRAST:  153mL OMNIPAQUE IOHEXOL 350 MG/ML SOLN COMPARISON:  CT head 09/13/2017, no prior CTA FINDINGS: CT HEAD FINDINGS Brain: Hypodensity in the right anterior temporal lobe, including the insula (series 2, images 12-13). There is also possible loss of gray-white differentiation in the right occipital lobe (series 2, image 16). It no evidence of acute hemorrhage, mass, mass effect, or midline shift. No hydrocephalus or extra-axial collection. Vascular: Suspect a hyperdense right MCA (series 7, images 25-26). Skull: Negative for fracture or focal lesion. Sinuses/Orbits:  No acute finding. Other: The mastoid air cells are well aerated. ASPECTS [facility] Stroke Program Early CT Score) - Ganglionic level infarction (caudate, lentiform nuclei, internal capsule, insula, M1-M3 cortex): 5 - Supraganglionic infarction (M4-M6 cortex): 3 Total score (0-10 with 10 being normal): 8 CTA NECK FINDINGS Evaluation is significantly limited by motion. Aortic arch: Standard branching. Imaged portion shows no evidence of aneurysm or dissection. No significant stenosis of the major arch vessel origins. Right carotid system: Grossly patent, however the distal right ICA is not well seen due to motion. Left carotid system: Grossly patent, however the distal left ICAs not well seen due to motion. Vertebral arteries: Patent V1 and V2 segments. The V3 segments are not well evaluated due to motion. Skeleton: No acute osseous abnormality.  Status post ACDF C5-C7 Other neck: Motion limited but no acute finding. Upper chest: Motion limited but diffuse nodular and ground-glass opacities. Review of the MIP images confirms the above findings CTA HEAD FINDINGS Evaluation is significantly limited by motion. Anterior circulation: Both internal carotid arteries are patent to the termini, without significant stenosis. A1 segments patent. Normal anterior communicating artery. Anterior cerebral arteries are patent proximally, with limited evaluation of the distal vessels. Occlusion of the proximal right M1 (series 8, image 273), with minimal opacification of the remainder of the right MCA territory. No left M1 stenosis or occlusion. Left MCA branches are grossly perfused. Posterior circulation: The vertebral arteries cannot be evaluated due to motion. Basilar patent to its distal aspect. Superior cerebellar arteries or not well seen. Patent P1 segments. Near fetal origin of the right PCA for prominent right posterior communicating artery. The right PCA is not seeing past the proximal P2 segment (series 8, image 266-270).  The left PCA is poorly visualized due to motion but appears patent proximally. Venous sinuses: As permitted by contrast timing, patent. Anatomic variants:  Near fetal origin of the right PCA. Review of the MIP images confirms the above findings CT BRAIN PERFUSION FINDINGS: ASPECTS: 8 CBF (<30%) Volume: Perfusion (Tmax>6.0s) volume: Mismatch Volume: 52mL Infarction Location:Right MCA territory. IMPRESSION: 1. The CTA is significantly limited by motion artifact within this limitation, there is hypodensity in the right anterior temporal lobe on the noncontrast CT head, including the insula, concerning for acute infarct, with right M1 occlusion on CTA and CT perfusion consistent with right MCA territory infarct, with 147 mL infarct core and 42 mL mismatch volume. 2. Possible loss of gray-white differentiation in the right occipital lobe, with likely occlusion of the right P2 segment. 3. Evaluation of the distal ICAs, vertebral arteries, superior cerebellar arteries, and left PCA is significantly limited by motion. This study is essentially nondiagnostic for additional stenosis. 4. Motion limited evaluation of the lungs, with diffuse nodular and ground-glass opacities, which could be infectious or inflammatory. Code stroke imaging results involving the R MCA were communicated on November 17, 2022 at 8:32 pm to provider Jacky Kindle via telephone, who verbally acknowledged these results. Additional findings were also discussed by telephone on 11-17-2022 at 8:56 pm with provider ABIGAIL CHAVEZ . Electronically Signed   By: Wiliam Ke M.D.   On: 11/17/2022 21:04   DG Chest Port 1 View  Result Date: 11/17/2022 CLINICAL DATA:  Acute respiratory distress. EXAM: PORTABLE CHEST 1 VIEW COMPARISON:  11/17/2022. FINDINGS: The heart size and mediastinal contours are stable. There is marked interval worsening of diffuse airspace opacities in the lungs bilaterally. No effusion or pneumothorax. No acute osseous abnormality.  IMPRESSION: Marked interval worsening of diffuse airspace opacities in the lungs bilaterally, possible edema or infiltrate. Electronically Signed   By: Thornell Sartorius M.D.   On: 2022/11/17 20:55   DG Chest Portable 1 View  Result Date: 11/14/2022 CLINICAL DATA:  Flu.  Hypoxia.  Coughing and rib pain. EXAM: PORTABLE CHEST 1 VIEW COMPARISON:  11/09/2022 FINDINGS: Stable cardiomediastinal contours. Marked central airway thickening. No pleural effusion. Interval development multifocal, bilateral airspace and interstitial opacities. IMPRESSION: Interval development of multifocal bilateral airspace and interstitial opacities compatible with multifocal infection. Marked central airway thickening. Electronically Signed   By: Signa Kell M.D.   On: 11/12/2022 06:42    Labs:  CBC: Recent Labs    11/02/2022 1956 11/14/22 0534 11-17-22 0603 11/16/22 0143 11/16/22 0318 11/16/22 0337  WBC 10.7* 10.1 13.8*  --   --  9.6  9.2  HGB 13.5 12.2 12.1 12.2 10.2* 10.6*  10.6*  HCT 41.0 37.1 36.5 36.0 30.0* 31.8*  32.4*  PLT 176 174 230  --   --  231  232    COAGS: No results for input(s): "INR", "APTT" in the last 8760 hours.  BMP: Recent Labs    11/14/22 0534 17-Nov-2022 0603 11/16/22 0143 11/16/22 0318 11/16/22 0337 11/16/22 0800  NA 138 136 140 139 137 140  K 4.3 3.8 3.9 4.1 4.1 4.2  CL 103 99  --   --  104 106  CO2 28 26  --   --  27 26  GLUCOSE 104* 156*  --   --  158* 129*  BUN 9 6*  --   --  5* 6*  CALCIUM 8.1* 8.3*  --   --  7.6* 7.5*  CREATININE 0.73 0.58  --   --  0.54 0.58  GFRNONAA >60 >60  --   --  >60 >60    LIVER FUNCTION TESTS: Recent Labs  09/02/22 1553 10/31/2022 0534 11/14/22 0534  BILITOT 1.2 0.6 0.7  AST 37 89* 56*  ALT 28 53* 48*  ALKPHOS 85 97 77  PROT 7.6 7.3 5.8*  ALBUMIN 3.7 2.9* 2.2*    Assessment and Plan:  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 Patient was  seen this morning with right lower extremity pulses findable by Doppler at approximately 9:30AM per bedside RN. NIR service was called approximately 11:30AM with a report that the patient's right foot is cold, pale, and without findable pulses via Doppler. Patient was reexamined and this report was found to be correct. Vascular surgery was subsequently consulted and accepted the patient for possible embolectomy. NIR service will continue to follow, please contact NIR with any questions or concerns.   Electronically Signed: Kennieth FrancoisMatthew L Dmarion Perfect, PA-C 11/16/2022, 12:25 PM   I spent a total of 35 Minutes at the the patient's bedside AND on the patient's hospital floor or unit, greater than 50% of which was counseling/coordinating care for right MCA occlusion s/p mechanical thrombectomy.

## 2022-11-16 NOTE — Consult Note (Signed)
NAME:  Melinda [name], MRN:  [id number], DOB:  1956/09/24, LOS: 2 ADMISSION DATE:  11/07/2022, CONSULTATION DATE:  1/18 REFERRING MD:  Corliss Skains, CHIEF COMPLAINT:  left sided weakness   History of Present Illness:  67 y/o female admitted on November 13, 2022 in the context of cough, vomiting.  Found to be flu a positive.  Admitted by the hospitalist, treated with 2 L nasal cannula, Tamiflu.  On January 17 patient was noted to have new onset left-sided weakness.  During the day on January 17 she ambulated with assistance in the hallway and required 5 L nasal cannula oxygen.  This is around 1:30 in the afternoon.  Code stroke was called at 8 PM when she was noted to have left-sided weakness and confusion.  She had been last seen normal at 530pm.  Telemetry neuro was consulted (the patient was admitted to [facility]) and further head imaging was ordered including a CT scan, CT angiogram, and CT perfusion study.  This showed a right M1 and P2 occlusion of right MCA, area of infarct in right MCA territory noted.  Study was noted to be motion degraded.  Interventional radiology was consulted and she was transferred from [facility] to [facility] where she underwent a thrombectomy the evening of January 17.  The patient was intubated and sedated for the procedure, pulmonary and critical care medicine was consulted for ventilator management.  Pertinent  Medical History  GERD Anxiety Tobacco abuse  Significant Hospital Events: Including procedures, antibiotic start and stop dates in addition to other pertinent events   November 13, 2022 admitted by [facility] hospitalist for acute hypoxemic respiratory failure secondary to influenza A pneumonia November 15, 2022 code stroke called, found to have right MCA occlusion with MCA territory infarct, underwent thrombectomy, intubated, pulmonary critical care medicine consulted  Interim History / Subjective:  As above  Objective   Blood pressure (!)  202/103, pulse 100, temperature (!) 97 F (36.1 C), temperature source Axillary, resp. rate (!) 29, height 5\' 6"  (1.676 m), weight 72.3 kg, SpO2 98 %.    FiO2 (%):  [80 %] 80 %   Intake/Output Summary (Last 24 hours) at 11/16/2022 0030 Last data filed at 11/16/2022 0020 Gross per 24 hour  Intake 2100 ml  Output --  Net 2100 ml   Filed Weights   11/12/2022 0508 11/17/2022 2106  Weight: 70.4 kg 72.3 kg    Examination: General:  In bed on vent HENT: NCAT ETT in place PULM: Coarse crackles bilaterally, vent supported breathing CV: RRR, no mgr GI: BS+, soft, nontender MSK: normal bulk and tone Neuro: sedated on vent   November 15, 2022 chest x-ray images independently reviewed showing severe worsening of bilateral pulmonary infiltrates compared to admission x-ray.  Resolved Hospital Problem list     Assessment & Plan:  Acute hypoxemic respiratory failure secondary to influenza pneumonia, complicated by need for mechanical ventilation in the setting of acute stroke with thrombectomy Worsening CXR, suspect evolving ARDS Full mechanical vent support TVol 8cc/kg Plateau currently acceptable, monitor Goal PaO2 55-65 Goal plateau < 30 Goal driving pressure < 15 cm November 17, 2022 VAP prevention Daily WUA/SBT Consider extubation after discussion with neuro team regarding further head imaging  Acute stroke, right MCA territory status post thrombectomy Infarct right MCA territory Secondary stroke prevention and management per primary service Goal glucose 140-180 Goal blood pressure 120-140 per neuro IR PT/OT/speech consult  Influenza A pneumonia: Tamiflu to continue Chest imaging as needed Send respiratory culture  Continue ceftriaxone and azithromycin for now given dramatic worsening and chest x-ray findings  GERD: Change pantoprazole to Famotidine bid due to enteric tube  Need for sedation/mechanical ventilation PAD protocol RASS goal 0 to -1 As needed fentanyl Propofol infusion per  PAD protocol   Best Practice (right click and "Reselect all SmartList Selections" daily)   Diet/type: tubefeeds DVT prophylaxis: SCD GI prophylaxis: PPI Lines: N/A Foley:  N/A Code Status:  full code Last date of multidisciplinary goals of care discussion [admission with [facility]]  Labs   CBC: Recent Labs  Lab 11/14/2022 0534 11/05/2022 1956 11/14/22 0534 11/22/2022 0603  WBC 9.2 10.7* 10.1 13.8*  NEUTROABS 7.6  --   --   --   HGB 14.2 13.5 12.2 12.1  HCT 41.9 41.0 37.1 36.5  MCV 88.8 92.3 91.8 91.7  PLT 174 176 174 220    Basic Metabolic Panel: Recent Labs  Lab 11/11/2022 0534 11/19/2022 1956 11/14/22 0534 11/19/2022 0603  NA 132*  --  138 136  K 4.0  --  4.3 3.8  CL 95*  --  103 99  CO2 26  --  28 26  GLUCOSE 135*  --  104* 156*  BUN 13  --  9 6*  CREATININE 0.88 0.80 0.73 0.58  CALCIUM 8.4*  --  8.1* 8.3*  MG  --   --   --  2.3   GFR: Estimated Creatinine Clearance: 71.4 mL/min (by C-G formula based on SCr of 0.58 mg/dL). Recent Labs  Lab 11/27/2022 0534 11/16/2022 1956 11/14/22 0534 11/14/22 0814 11/10/2022 0603  PROCALCITON  --   --   --  0.32 0.17  WBC 9.2 10.7* 10.1  --  13.8*    Liver Function Tests: Recent Labs  Lab 11/07/2022 0534 11/14/22 0534  AST 89* 56*  ALT 53* 48*  ALKPHOS 97 77  BILITOT 0.6 0.7  PROT 7.3 5.8*  ALBUMIN 2.9* 2.2*   No results for input(s): "LIPASE", "AMYLASE" in the last 168 hours. No results for input(s): "AMMONIA" in the last 168 hours.  ABG    Component Value Date/Time   PHART 7.46 (H) 11/29/2022 2110   PCO2ART 41 11/17/2022 2110   PO2ART 62 (L) 11/21/2022 2110   HCO3 29.9 (H) 11/20/2022 2110   TCO2 27 08/20/2017 0224   O2SAT 94.1 11/09/2022 2110     Coagulation Profile: No results for input(s): "INR", "PROTIME" in the last 168 hours.  Cardiac Enzymes: No results for input(s): "CKTOTAL", "CKMB", "CKMBINDEX", "TROPONINI" in the last 168 hours.  HbA1C: No results found for: "HGBA1C"  CBG: Recent Labs  Lab  11/02/2022 1944  GLUCAP 155*    Review of Systems:   Cannot obtain due to intubation  Past Medical History:  She,  has a past medical history of Reflux and Sprain of neck (11/24/2013).   Surgical History:   Past Surgical History:  Procedure Laterality Date   ABDOMINAL HYSTERECTOMY     BREAST BIOPSY Right 02/03/2013   stereotactic biopsy negative   BREAST BIOPSY Left 2006   negative   BREAST BIOPSY Left 07/14/2009   negative with clip   BREAST CYST ASPIRATION Left 2006   negative   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   CERVICAL FUSION     CESAREAN SECTION     ESOPHAGEAL DILATION       Social History:   reports that she has quit smoking. Her smoking use included cigarettes. She has never used smokeless tobacco. She reports that she does not  drink alcohol and does not use drugs.   Family History:  Her family history includes Breast cancer in her cousin, cousin, maternal aunt, and maternal grandmother; Diabetes in her father.   Allergies Allergies  Allergen Reactions   Aspirin Anaphylaxis   Succinylcholine Anaphylaxis    coma   Tramadol Itching   Tizanidine Anxiety     Home Medications  Prior to Admission medications   Medication Sig Start Date End Date Taking? Authorizing Provider  Calcium Carbonate Antacid (TUMS PO) Take 2 tablets by mouth 2 (two) times daily as needed (heartburn).   Yes [provider]  omeprazole (PRILOSEC) 20 MG capsule Take 1 capsule (20 mg total) by mouth daily. Patient not taking: Reported on 11/14/2022 01/18/21   Tedd Sias, PA     Critical care time: 35  minutes    Roselie Awkward, MD [facility] Pager: [phone] Cell: [phone] After 7:00 pm call Elink  [phone]

## 2022-11-16 NOTE — Progress Notes (Addendum)
[facility] NOTE   MRN : [id number]  Reason for Consult: Pulseless right LE after endovascular intervention for Stroke.  Referring Physician:   History of Present Illness: 67 y/o female intubated.  She presented with SOB and cough.  She tested positive for influenza A.     Hospital course:  Primary Diagnosis:  Cerebral infarction due to embolism of  right middle cerebral artery.   1) Brain CTA FINDINGS: Brain: Large established infarct in the right MCA and PCA cortex. Smaller acute infarct in the right cerebellum. Subarachnoid contrast and gas (which is likely intravascular) has diminished. Mild contrast staining or petechial hemorrhage at the right basal ganglia.   Vascular: Not well assessed after recent contrast   Skull: Normal. Negative for fracture or focal lesion.   Sinuses/Orbits: No acute finding.   IMPRESSION: 1. Established acute infarct involving most of the right MCA/PCA cortex. Smaller acute infarct in the right cerebellum. 2. Petechial hemorrhage or contrast staining at the right basal ganglia; subarachnoid contrast and intravascular gas is diminished from procedural CT.   2) Dr. Estanislado Pandy performed a right CFA approach 11/15/21  Status post revascularization of the right middle cerebral artery distribution with 2 passes with complex aspiration, and 1 pass with contact aspiration and use of a 3 mm x 20 mm Solitaire X retrieval device achieving aTICI 2C revascularization.    Manual compression held at the right groin puncture site with quick clot for approximately 30 minutes for hemostasis due to failed 8 French Angio-Seal closure device.  Distal pulses. Non dopplerable RT DP  and posterior tibial pulse prior to the procedure.  Dopplerable DP and PT on the left unchanged.    Post CT of the brain demonstrates large evolving early infarction of the right middle cerebral artery distribution with evidence of petechial  hemorrhages. Mild right perisylvian subarachnoid hyperattenuation.    Pulses doppler signals were present on the right this am and on exam tibial dopplers were absent below the popliteal artery.   We have been consulted for ischemic right LE.   Current Facility-Administered Medications  Medication Dose Route Frequency Provider Last Rate Last Admin   0.9 %  sodium chloride infusion   Intravenous Continuous Raenette Rover, NP 100 mL/hr at 2022-12-08 2127 New Bag at 11/16/22 0002   0.9 %  sodium chloride infusion   Intravenous Continuous Donnetta Simpers, MD   Stopped at 11/16/22 0824   0.9 %  sodium chloride infusion   Intravenous Continuous Deveshwar, Willaim Rayas, MD       acetaminophen (TYLENOL) tablet 650 mg  650 mg Oral Q4H PRN Luanne Bras, MD       Or   acetaminophen (TYLENOL) 160 MG/5ML solution 650 mg  650 mg Per Tube Q4H PRN Deveshwar, Willaim Rayas, MD       Or   acetaminophen (TYLENOL) suppository 650 mg  650 mg Rectal Q4H PRN Deveshwar, Willaim Rayas, MD       ALPRAZolam Duanne Moron) tablet 0.5 mg  0.5 mg Per Tube BID PRN Janine Ores, NP       azithromycin (ZITHROMAX) 500 mg in sodium chloride 0.9 % 250 mL IVPB  500 mg Intravenous Q24H Tawnya Crook, RPH   Stopped at 11/16/22 0924   cefTRIAXone (ROCEPHIN) 2 g in sodium chloride 0.9 % 100 mL IVPB  2 g Intravenous Q24H Tawnya Crook, RPH   Stopped at 11/16/22 0636   Chlorhexidine Gluconate Cloth 2 % PADS 6 each  6 each Topical Q2200 Antony Contras  S, MD       clevidipine (CLEVIPREX) 0.5 MG/ML infusion            clevidipine (CLEVIPREX) infusion 0.5 mg/mL  0-21 mg/hr Intravenous Continuous Deveshwar, Simonne Maffucci, MD 12 mL/hr at 11/16/22 1137 6 mg/hr at 11/16/22 1137   docusate (COLACE) 50 MG/5ML liquid 100 mg  100 mg Per Tube BID Max Fickle B, MD   100 mg at 11/16/22 0951   famotidine (PEPCID) tablet 20 mg  20 mg Per Tube BID Max Fickle B, MD   20 mg at 11/16/22 0952   feeding supplement (OSMOLITE 1.5 CAL) liquid 1,000 mL  1,000 mL  Per Tube Continuous Micki Riley, MD 50 mL/hr at 11/16/22 1100 Infusion Verify at 11/16/22 1100   feeding supplement (PROSource TF20) liquid 60 mL  60 mL Per Tube Daily Max Fickle B, MD   60 mL at 11/16/22 0951   fentaNYL (SUBLIMAZE) bolus via infusion 25-100 mcg  25-100 mcg Intravenous Q15 min PRN Olalere, Adewale A, MD       fentaNYL in NS (26mcg/ml) infusion-PREMIX  0-200 mcg/hr Intravenous Continuous Olalere, Adewale A, MD 2.5 mL/hr at 11/16/22 1210 25 mcg/hr at 11/16/22 1210   guaiFENesin (ROBITUSSIN) 100 MG/5ML liquid 5 mL  5 mL Per Tube Q4H PRN Elmer Picker, NP       hydrALAZINE (APRESOLINE) injection 10 mg  10 mg Intravenous Q4H PRN Amin, Ankit Chirag, MD       ipratropium-albuterol (DUONEB) 0.5-2.5 (3) MG/3ML nebulizer solution 3 mL  3 mL Nebulization TID Earlie Lou L, MD   3 mL at 11/16/22 0813   ipratropium-albuterol (DUONEB) 0.5-2.5 (3) MG/3ML nebulizer solution 3 mL  3 mL Nebulization Q4H PRN Amin, Ankit Chirag, MD       methylPREDNISolone sodium succinate (SOLU-MEDROL) 40 mg/mL injection 40 mg  40 mg Intravenous Q12H Amin, Ankit Chirag, MD   40 mg at 11/16/22 0951   metoprolol tartrate (LOPRESSOR) injection 5 mg  5 mg Intravenous Q4H PRN Amin, Ankit Chirag, MD       morphine (PF) 2 MG/ML injection 2 mg  2 mg Intravenous Q4H PRN Earlie Lou L, MD   2 mg at 2022/12/10 1539   naloxone (NARCAN) injection 0.4 mg  0.4 mg Intravenous PRN Anthoney Harada, NP   0.2 mg at Dec 10, 2022 1952   nicotine (NICODERM CQ - dosed in mg/24 hours) patch 21 mg  21 mg Transdermal Daily Amin, Ankit Chirag, MD   21 mg at 11/16/22 0951   ondansetron (ZOFRAN) injection 4 mg  4 mg Intravenous Q6H PRN Amin, Ankit Chirag, MD       Oral care mouth rinse  15 mL Mouth Rinse PRN Rometta Emery, MD       Oral care mouth rinse  15 mL Mouth Rinse Q2H Erick Blinks, MD   15 mL at 11/16/22 1152   Oral care mouth rinse  15 mL Mouth Rinse PRN Erick Blinks, MD       oseltamivir  (TAMIFLU) capsule 75 mg  75 mg Per Tube BID Elmer Picker, NP   75 mg at 11/16/22 0951   polyethylene glycol (MIRALAX / GLYCOLAX) packet 17 g  17 g Per Tube Daily Max Fickle B, MD   17 g at 11/16/22 0951   propofol (DIPRIVAN) 1000 MG/100ML infusion  0-50 mcg/kg/min Intravenous Continuous Max Fickle B, MD 17.35 mL/hr at 11/16/22 1100 40 mcg/kg/min at 11/16/22 1100   senna-docusate (Senokot-S) tablet 1 tablet  1 tablet Per Tube QHS  PRN Elmer PickerShafer, Devon, NP       sodium chloride (hypertonic) 3 % solution   Intravenous Continuous Erick BlinksKhaliqdina, Salman, MD 75 mL/hr at 11/16/22 1100 Infusion Verify at 11/16/22 1100   sucralfate (CARAFATE) 1 GM/10ML suspension 1 g  1 g Per Tube BID Elmer PickerShafer, Devon, NP   1 g at 11/16/22 0951   traZODone (DESYREL) tablet 50 mg  50 mg Per Tube QHS PRN Elmer PickerShafer, Devon, NP        Pt meds include: Statin :No Betablocker: No ASA: No Other anticoagulants/antiplatelets: none  Past Medical History:  Diagnosis Date   Reflux    Sprain of neck 11/24/2013    Past Surgical History:  Procedure Laterality Date   ABDOMINAL HYSTERECTOMY     BREAST BIOPSY Right 02/03/2013   stereotactic biopsy negative   BREAST BIOPSY Left 2006   negative   BREAST BIOPSY Left 07/14/2009   negative with clip   BREAST CYST ASPIRATION Left 2006   negative   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   CERVICAL FUSION     CESAREAN SECTION     ESOPHAGEAL DILATION     RADIOLOGY WITH ANESTHESIA N/A 11/23/2022   Procedure: IR WITH ANESTHESIA;  Surgeon: Julieanne Cottoneveshwar, Sanjeev, MD;  Location: [facility] OR;  Service: Radiology;  Laterality: N/A;    Social History Social History   Tobacco Use   Smoking status: Former    Types: Cigarettes   Smokeless tobacco: Never  Substance Use Topics   Alcohol use: No    Alcohol/week: 0.0 standard drinks of alcohol   Drug use: No    Family History Family History  Problem Relation Age of Onset   Diabetes Father    Breast cancer Maternal Aunt    Breast cancer Maternal  Grandmother    Breast cancer Cousin    Breast cancer Cousin     Allergies  Allergen Reactions   Aspirin Anaphylaxis   Succinylcholine Anaphylaxis    coma   Tramadol Itching   Tizanidine Anxiety     REVIEW OF SYSTEMS  General: [ ]  Weight loss, [ ]  Fever, [ ]  chills Neurologic: [ ]  Dizziness, [ ]  Blackouts, [ ]  Seizure [ ]  Stroke, [ ]  "Mini stroke", [ ]  Slurred speech, [ ]  Temporary blindness; [ ]  weakness in arms or legs, [ ]  Hoarseness [ ]  Dysphagia Cardiac: [ ]  Chest pain/pressure, [ ]  Shortness of breath at rest [ ]  Shortness of breath with exertion, [ ]  Atrial fibrillation or irregular heartbeat  Vascular: [ ]  Pain in legs with walking, [ ]  Pain in legs at rest, [ ]  Pain in legs at night,  [ ]  Non-healing ulcer, [ ]  Blood clot in vein/DVT,   Pulmonary: [ ]  Home oxygen, [ ]  Productive cough, [ ]  Coughing up blood, [ ]  Asthma,  [ ]  Wheezing [ ]  COPD Musculoskeletal:  [ ]  Arthritis, [ ]  Low back pain, [ ]  Joint pain Hematologic: [ ]  Easy Bruising, [ ]  Anemia; [ ]  Hepatitis Gastrointestinal: [ ]  Blood in stool, [ ]  Gastroesophageal Reflux/heartburn, Urinary: [ ]  chronic Kidney disease, [ ]  on HD - [ ]  MWF or [ ]  TTHS, [ ]  Burning with urination, [ ]  Difficulty urinating Skin: [ ]  Rashes, [ ]  Wounds Psychological: [ ]  Anxiety, [ ]  Depression  Physical Examination Vitals:   11/16/22 1100 11/16/22 1115 11/16/22 1116 11/16/22 1137  BP: 136/76 (!) 148/80 (!) 148/80 118/68  Pulse: (!) 105 (!) 114 (!) 117   Resp: (!) 33 (!) 35 (!) 41 Marland Kitchen(!)  32  Temp: 97.9 F (36.6 C) 98.1 F (36.7 C) 98.1 F (36.7 C)   TempSrc:      SpO2: 93% 92% 90% 97%  Weight:      Height:       Body mass index is 28.24 kg/m.  General:  WDWN in NAD  HENT: WNL Eyes: Pupils equal Pulmonary: normal non-labored breathing , without Rales, rhonchi,  wheezing Cardiac: RRR, Tachy, without  Murmurs, rubs or gallops; No carotid bruits Abdomen: soft, NT, no masses Skin: no rashes, ulcers noted;  no Gangrene ,  no cellulitis; no open wounds;  Right LE palor, cool touch no cap refill noted. Vascular Exam/Pulses femoral pulses, popliteal right LE doppler signal no tibial/pedal doppler signals   Musculoskeletal: no muscle wasting or atrophy; no edema  Neurologic: not responsive to verbal que's  SENSATION: normal; MOTOR FUNCTION: spontaneous movement of the right LE    Significant Diagnostic Studies: CBC Lab Results  Component Value Date   WBC 9.2 11/16/2022   WBC 9.6 11/16/2022   HGB 10.6 (L) 11/16/2022   HGB 10.6 (L) 11/16/2022   HCT 32.4 (L) 11/16/2022   HCT 31.8 (L) 11/16/2022   MCV 93.4 11/16/2022   MCV 94.1 11/16/2022   PLT 232 11/16/2022   PLT 231 11/16/2022    BMET    Component Value Date/Time   NA 140 11/16/2022 0800   NA 141 09/19/2013 1545   K 4.2 11/16/2022 0800   K 3.9 09/19/2013 1545   CL 106 11/16/2022 0800   CL 108 (H) 09/19/2013 1545   CO2 26 11/16/2022 0800   CO2 25 09/19/2013 1545   GLUCOSE 129 (H) 11/16/2022 0800   GLUCOSE 126 (H) 09/19/2013 1545   BUN 6 (L) 11/16/2022 0800   BUN 19 (H) 09/19/2013 1545   CREATININE 0.58 11/16/2022 0800   CREATININE 0.71 09/19/2013 1545   CALCIUM 7.5 (L) 11/16/2022 0800   CALCIUM 9.0 09/19/2013 1545   GFRNONAA >60 11/16/2022 0800   GFRNONAA >60 09/19/2013 1545   GFRAA >60 07/18/2020 1827   GFRAA >60 09/19/2013 1545   Estimated Creatinine Clearance: 66.8 mL/min (by C-G formula based on SCr of 0.58 mg/dL).  COAG No results found for: "INR", "PROTIME"   Non-Invasive Vascular Imaging:   +---------+---------------+---------+-----------+----------+--------------+   RIGHT   CompressibilityPhasicitySpontaneityPropertiesThrombus  Aging  +---------+---------------+---------+-----------+----------+--------------+   CFV     Full           Yes      Yes                                   +---------+---------------+---------+-----------+----------+--------------+   SFJ     Full                                                           +---------+---------------+---------+-----------+----------+--------------+   FV Prox  Full                                                          +---------+---------------+---------+-----------+----------+--------------+   FV Mid   Full                                                          +---------+---------------+---------+-----------+----------+--------------+  FV DistalFull                                                          +---------+---------------+---------+-----------+----------+--------------+   PFV     Full                                                          +---------+---------------+---------+-----------+----------+--------------+   POP     Full           Yes      Yes                                   +---------+---------------+---------+-----------+----------+--------------+   PTV     Full                                                          +---------+---------------+---------+-----------+----------+--------------+   PERO    Full                                                          +---------+---------------+---------+-----------+----------+--------------+          +---------+---------------+---------+-----------+----------+--------------+   LEFT    CompressibilityPhasicitySpontaneityPropertiesThrombus  Aging  +---------+---------------+---------+-----------+----------+--------------+   CFV     Full           Yes      Yes                                   +---------+---------------+---------+-----------+----------+--------------+   SFJ     Full                                                          +---------+---------------+---------+-----------+----------+--------------+   FV Prox  Full                                                           +---------+---------------+---------+-----------+----------+--------------+   FV Mid   Full                                                          +---------+---------------+---------+-----------+----------+--------------+   FV DistalFull                                                          +---------+---------------+---------+-----------+----------+--------------+  PFV     Full                                                          +---------+---------------+---------+-----------+----------+--------------+   POP     Full           Yes      Yes                                   +---------+---------------+---------+-----------+----------+--------------+   PTV     Full                                                          +---------+---------------+---------+-----------+----------+--------------+   PERO    Full                                                          +---------+---------------+---------+-----------+----------+--------------+   Summary:  BILATERAL:  - No evidence of deep vein thrombosis seen in the lower extremities,  bilaterally.   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  Distal pulses are Dopplerable on left lower extremity, non-Dopplerable on right at both posterior tibial site and dorsalis pedis site. Right popliteal pulse is palpable.  Right foot is cool with delayed capillary refill. Right calf is warm.  STAT CTA abd/pelvis with right LE runoff ordered.    Heparin has not been started she has CTA of brain:   Large established infarct in the right MCA and PCA cortex. Smaller acute infarct in the right cerebellum. Subarachnoid contrast and gas (which is likely intravascular) has diminished. Mild contrast staining or petechial hemorrhage at the right  basal ganglia.  Mosetta Pigeon 11/16/2022 12:38 PM  VASCULAR STAFF ADDENDUM: I have independently interviewed and examined the patient. I agree with the above.  Patient intubated and sedated status post right hemispheric stroke.  Prior to intubation, left side completely flaccid. Now status post thrombectomy from right-sided access.  Right leg with no signals. Likely popliteal artery thrombus. Patient pending CT angio of the head, abdomen pelvis with runoff. I have discussed the above with Dr. Pearlean Brownie, as I am concerned that heparinization will likely lead to bleeding event.  Overall prognosis is poor.  I have tried to reach out to the patient's daughter who did not answer her phone to discuss the risks and benefits of right lower extremity revascularization.  I plan to talk to Dr. Pearlean Brownie once the CT head, abdomen pelvis with runoff has been completed.     Fara Olden, MD [facility] Phone Number: [phone] 11/16/2022 1:35 PM

## 2022-11-16 NOTE — Progress Notes (Addendum)
CT Head demonstrates large R MCA and R PCA stroke along with a smaller R Cerebellar stroke. Some petechial hemorrhage.  Given the extent of the stroke, concern that she may progress to developing malignant cerebral edema. Will start her on hypertonic saline.  [place] Pager Number [phone]

## 2022-11-16 NOTE — Progress Notes (Addendum)
CROSS COVER NOTE  NAME: Melinda [name] MRN: [id number] DOB : 06-Aug-1956    Date of Service   11/16/2022   HPI/Events of Note   1945- Rapid response called for change in mental status.  Per bedside RN patient was found not wearing oxygen, O2 sats 60 to 70% on room air.  Patient placed on nonrebreather, O2 sats back to 92%.  1949- When I arrived to the room, rapid response nurse and bedside RN were present to update me.  Last CBG was 155.  No obvious facial asymmetry noted.  Patient is currently making incomprehensible sounds.  She is seen only mobilizing right upper and lower extremities.  Left upper extremity does not respond to pain, however left lower extremity does respond to painful stimulation.  Patient unable to open eyes independently.  She attempts to follow some commands, like gripping on her right upper extremity.    Due to acute onset of change, code stroke activated at 9. Per daytime RN, last known well time was 1730. Approximately at 1820, patient had complaint of extremity numbness and/or weakness.  Order placed for Narcan as pt received morphine earlier, no improvement noted. Orders also placed for STAT head CT without contrast.  Teleneuro officially involved at [place].  2016-CT angio head/ neck with and without contrast perfusion scan requested by teleneurologist.  2040- 1 mg Ativan given to patient so imaging can be completed.  2100- patient currently in ICU.  Awaiting neurology recommendations at this time.  2135- Dr. Lorrin Goodell [facility] neurology) updated me via secure chat that he is currently coordinating with telemetry neuro, neuro IR and patient's family regarding interventions.   2145- patient to be transferred to [facility] for thrombectomy.  Pt found to have R MCA M1 occlusion and R PCA P2 occlusion. Neurology will take over as primary.  2150-[facility] at bedside  2159- [facility] has left ICU with patient   Interventions/ Plan   X X X        Raenette Rover, DNP, [place]

## 2022-11-16 NOTE — Progress Notes (Signed)
Initial Nutrition Assessment  DOCUMENTATION CODES:   Not applicable  INTERVENTION:   Initiate tube feeding via OG tube: Osmolite 1.5 at 50 ml/h (1200 ml per day) Prosource TF20 60 ml daily  Provides 1860 kcal, 95 gm protein, 912 ml free water daily   NUTRITION DIAGNOSIS:   Inadequate oral intake related to inability to eat as evidenced by NPO status.  GOAL:   Patient will meet greater than or equal to 90% of their needs  MONITOR:   TF tolerance  REASON FOR ASSESSMENT:   Consult Enteral/tube feeding initiation and management  ASSESSMENT:   Pt with PMH of GERD, anxiety, and tobacco abuse admitted 1/15 with acute hypoxemic respiratory failure due to flu A PNA, tx 1/17 to [facility] for large R MCA and R PCA infarct with smaller R cerebellar stroke s/p thrombectomy.   Pt discussed during ICU rounds and with RN.  Per neurology pt staring on hypertonic saline due to extent of stroke pt at risk for developing malignant cerebral edema.   During exam noted R foot pale in color and cold to touch, notified RN of findings.   Medications reviewed and include: colace, pepcid, solumedrol, tamiflu, miralax, carafate IV Abx Propofol Hypertonci  Labs reviewed: TG 330 A1C: 5.9 K/Phos/Mag WNL CBG: 146  18 F OG tube; per xray side port near GE junction; spoke with RN who will advance tube further into the stomach    NUTRITION - FOCUSED PHYSICAL EXAM:  Flowsheet Row Most Recent Value  Orbital Region No depletion  Upper Arm Region No depletion  Thoracic and Lumbar Region No depletion  Buccal Region No depletion  Temple Region No depletion  Clavicle Bone Region No depletion  Clavicle and Acromion Bone Region No depletion  Scapular Bone Region No depletion  Dorsal Hand No depletion  Patellar Region No depletion  Anterior Thigh Region No depletion  Posterior Calf Region No depletion  Edema (RD Assessment) None  Hair Reviewed  Eyes Unable to assess  Mouth Unable to assess  Skin  Reviewed  [R foot cold to touch and pale]  Nails Reviewed       Diet Order:   Diet Order             Diet NPO time specified  Diet effective now                   EDUCATION NEEDS:   Not appropriate for education at this time  Skin:  Skin Assessment: Reviewed RN Assessment  Last BM:  unknown  Height:   Ht Readings from Last 1 Encounters:  11/16/22 5\' 3"  (1.6 m)    Weight:   Wt Readings from Last 1 Encounters:  11/26/2022 72.3 kg    BMI:  Body mass index is 28.24 kg/m.  Estimated Nutritional Needs:   Kcal:  1700-1900  Protein:  95-110 grams  Fluid:  >1.7 L/day  Lockie Pares., RD, LDN, CNSC See AMiON for contact information

## 2022-11-16 NOTE — Progress Notes (Signed)
ABG Results Ph=7.20 CO2=71 P02=158 HCO3=28  Increased RR to 30

## 2022-11-16 NOTE — Progress Notes (Signed)
62 RN assessed right foot and groin. Groin stable, no hematoma present. Foot more pale than previous assessment. Unable to doppler PT or DP pulses. Able to doppler popliteal pulse. Two other nurses attempted to doppler pulses. Unable to find. IR notified. PA came to bedside to assess pt. Vascular then consulted. Order for CTA entered.

## 2022-11-17 ENCOUNTER — Encounter (HOSPITAL_COMMUNITY): Payer: Self-pay | Admitting: Interventional Radiology

## 2022-11-17 ENCOUNTER — Inpatient Hospital Stay (HOSPITAL_COMMUNITY): Payer: 59

## 2022-11-17 LAB — BASIC METABOLIC PANEL
Anion gap: 8 (ref 5–15)
BUN: 7 mg/dL — ABNORMAL LOW (ref 8–23)
CO2: 27 mmol/L (ref 22–32)
Calcium: 7.6 mg/dL — ABNORMAL LOW (ref 8.9–10.3)
Chloride: 114 mmol/L — ABNORMAL HIGH (ref 98–111)
Creatinine, Ser: 0.6 mg/dL (ref 0.44–1.00)
GFR, Estimated: >60 mL/min (ref >60)
Glucose, Bld: 214 mg/dL — ABNORMAL HIGH (ref 70–99)
Potassium: 3.3 mmol/L — ABNORMAL LOW (ref 3.5–5.1)
Sodium: 149 mmol/L — ABNORMAL HIGH (ref 135–145)

## 2022-11-17 LAB — GLUCOSE, CAPILLARY
Glucose-Capillary: 216 mg/dL — ABNORMAL HIGH (ref 70–99)
Glucose-Capillary: 154 mg/dL — ABNORMAL HIGH (ref 70–99)
Glucose-Capillary: 158 mg/dL — ABNORMAL HIGH (ref 70–99)
Glucose-Capillary: 123 mg/dL — ABNORMAL HIGH (ref 70–99)

## 2022-11-17 LAB — CBC
HCT: 30.6 % — ABNORMAL LOW (ref 36.0–46.0)
Hemoglobin: 10 g/dL — ABNORMAL LOW (ref 12.0–15.0)
MCH: 30.6 pg (ref 26.0–34.0)
MCHC: 32.7 g/dL (ref 30.0–36.0)
MCV: 93.6 fL (ref 80.0–100.0)
Platelets: 208 10*3/uL (ref 150–400)
RBC: 3.27 MIL/uL — ABNORMAL LOW (ref 3.87–5.11)
RDW: 14.9 % (ref 11.5–15.5)
WBC: 8.4 10*3/uL (ref 4.0–10.5)
nRBC: 0 % (ref 0.0–0.2)

## 2022-11-17 LAB — MAGNESIUM
Magnesium: 2.3 mg/dL (ref 1.7–2.4)
Magnesium: 2.3 mg/dL (ref 1.7–2.4)

## 2022-11-17 LAB — TRIGLYCERIDES: Triglycerides: 363 mg/dL — ABNORMAL HIGH (ref <150)

## 2022-11-17 LAB — SODIUM
Sodium: 156 mmol/L — ABNORMAL HIGH (ref 135–145)
Sodium: 157 mmol/L — ABNORMAL HIGH (ref 135–145)

## 2022-11-17 LAB — PHOSPHORUS: Phosphorus: 2.7 mg/dL (ref 2.5–4.6)

## 2022-11-17 MED ORDER — SODIUM CHLORIDE 0.9 % IV SOLN: INTRAVENOUS | Status: DC | PRN

## 2022-11-17 MED ORDER — ACETAMINOPHEN 325 MG PO TABS
650.0000 mg | ORAL_TABLET | Freq: Four times a day (QID) | ORAL | Status: DC | PRN
  Administered 2022-11-18: 650 mg
  Filled 2022-11-17: qty 2

## 2022-11-17 MED ORDER — INSULIN ASPART 100 UNIT/ML IJ SOLN
2.0000 [IU] | INTRAMUSCULAR | Status: DC
  Administered 2022-11-17 (×4): 2 [IU] via SUBCUTANEOUS

## 2022-11-17 MED ORDER — MIDAZOLAM BOLUS VIA INFUSION: 0.0000 mg | INTRAVENOUS | Status: DC | PRN

## 2022-11-17 MED ORDER — POTASSIUM CHLORIDE 20 MEQ PO PACK
20.0000 meq | PACK | ORAL | Status: AC
  Administered 2022-11-17 (×2): 20 meq
  Filled 2022-11-17 (×2): qty 1

## 2022-11-17 MED ORDER — SODIUM CHLORIDE 0.9 % IV SOLN: INTRAVENOUS | Status: DC

## 2022-11-17 MED ORDER — GLYCOPYRROLATE 0.2 MG/ML IJ SOLN: 0.2000 mg | INTRAMUSCULAR | Status: DC | PRN

## 2022-11-17 MED ORDER — POTASSIUM CHLORIDE 10 MEQ/100ML IV SOLN
10.0000 meq | INTRAVENOUS | Status: AC
  Administered 2022-11-17 (×4): 10 meq via INTRAVENOUS
  Filled 2022-11-17 (×4): qty 100

## 2022-11-17 MED ORDER — CALCIUM GLUCONATE-NACL 1-0.675 GM/50ML-% IV SOLN
1.0000 g | Freq: Once | INTRAVENOUS | Status: AC
  Administered 2022-11-17: 1000 mg via INTRAVENOUS
  Filled 2022-11-17: qty 50

## 2022-11-17 MED ORDER — MIDAZOLAM-SODIUM CHLORIDE 100-0.9 MG/100ML-% IV SOLN
0.0000 mg/h | INTRAVENOUS | Status: DC
  Administered 2022-11-17: 2 mg/h via INTRAVENOUS
  Filled 2022-11-17: qty 100

## 2022-11-17 MED ORDER — ACETAMINOPHEN 650 MG RE SUPP
650.0000 mg | Freq: Four times a day (QID) | RECTAL | Status: DC | PRN
  Administered 2022-11-18: 650 mg via RECTAL
  Filled 2022-11-17: qty 1

## 2022-11-17 MED ORDER — GLYCOPYRROLATE 1 MG PO TABS: 1.0000 mg | ORAL_TABLET | ORAL | Status: DC | PRN

## 2022-11-17 MED ORDER — DEXTROSE 10 % IV SOLN: INTRAVENOUS | Status: DC

## 2022-11-17 MED ORDER — GLYCOPYRROLATE 0.2 MG/ML IJ SOLN
0.2000 mg | INTRAMUSCULAR | Status: DC | PRN
  Filled 2022-11-17: qty 1

## 2022-11-17 MED ORDER — POLYVINYL ALCOHOL 1.4 % OP SOLN: 1.0000 [drp] | Freq: Four times a day (QID) | OPHTHALMIC | Status: DC | PRN

## 2022-11-17 MED ORDER — INSULIN ASPART 100 UNIT/ML IJ SOLN
2.0000 [IU] | INTRAMUSCULAR | Status: DC
  Administered 2022-11-17: 4 [IU] via SUBCUTANEOUS
  Administered 2022-11-17: 2 [IU] via SUBCUTANEOUS
  Administered 2022-11-17: 4 [IU] via SUBCUTANEOUS
  Administered 2022-11-17: 6 [IU] via SUBCUTANEOUS

## 2022-11-17 NOTE — Progress Notes (Signed)
eLink Physician-Brief Progress Note Patient Name: Melinda [name] DOB: 09/20/1956 MRN: [id number]   Date of Service  11/17/2022  HPI/Events of Note  Patient with hyperglycemia. Corrected Calcium 9.0 gm / dl, which is low normal.  eICU Interventions  CBG + SSI ordered, Calcium gluconate 1 gm iv x 1.     Intervention Category Major Interventions: Hyperglycemia - active titration of insulin therapy Intermediate Interventions: Electrolyte abnormality - evaluation and management  Frederik Pear 11/17/2022, 3:38 AM

## 2022-11-17 NOTE — Progress Notes (Signed)
PCCM Progress note   Noted change in right pulpi size this afternoon, aggressive measures were not perused at time of clinical change given the pending decision on comfort care.  I reach back out to family this afternoon to confirm goals and wishes and family would like to proceed with head CT scan to gather as much information as possible to help determine next best plan of care.  Stat head CT order placed  Jernee Murtaugh D. Harris, NP-C  Pulmonary & Critical Care Personal contact information can be found on Amion  If no contact or response made please call [phone] 11/17/2022, 5:40 PM

## 2022-11-17 NOTE — Anesthesia Postprocedure Evaluation (Signed)
Anesthesia Post Note  Patient: Melinda [name]  Procedure(s) Performed: IR WITH ANESTHESIA     Patient location during evaluation: SICU Anesthesia Type: General Level of consciousness: sedated Pain management: pain level controlled Vital Signs Assessment: post-procedure vital signs reviewed and stable Respiratory status: patient remains intubated per anesthesia plan Cardiovascular status: stable Postop Assessment: no apparent nausea or vomiting Anesthetic complications: no   No notable events documented.  Last Vitals:  Vitals:   11/17/22 0630 11/17/22 0645  BP: 131/68 127/69  Pulse: 78 73  Resp: (!) 24 (!) 28  Temp: (!) 36.1 C (!) 36.1 C  SpO2: 97% 96%    Last Pain:  Vitals:   11/16/22 0050  TempSrc:   PainSc: 0-No pain                 Shante Archambeault S

## 2022-11-17 NOTE — Progress Notes (Addendum)
Brief Neuro Update:  No charge note.  I did not examine Melinda [name] but I reviewed the images with multiple family members in the conference room. Discussed large Right sided stroke with brain compression and brain herniation. Discussed poor prognosis. There is high likelihood that this is a terminal event for her. Discussed that the swelling accompanying her large stroke is pushing on the rest of the brain and will result in elevation of Intracranial pressure and when the intracranial pressure rises above blood pressure, will result in progression of stroke and ultimately to brain death. Even if she survives this, she will likely be in a nursing home and require assistance with pretty much all activities of daily living.  Family made a decision to transition to full comfort care. None of the family members had any additional questions for me.  Comfort care orders placed. RN to reach out to [place].  [place] Pager Number [phone]

## 2022-11-17 NOTE — Progress Notes (Signed)
PT Cancellation Note  Patient Details Name: Melinda [name] MRN: [id number] DOB: 10-15-1956   Cancelled Treatment:    Reason Eval/Treat Not Completed: Patient not medically ready.  Hold today, ? Comfort.  Checking back 1/20.    [name] 11/17/2022, 1:27 PM

## 2022-11-17 NOTE — Progress Notes (Signed)
  Transition of Care [facility]) Screening Note   Patient Details  Name: Melinda [name] Date of Birth: July 01, 1956   Transition of Care [facility]) CM/SW Contact:    Benard Halsted, LCSW Phone Number: 11/17/2022, 4:20 PM    Transition of Care Department [facility]) has reviewed patient. We will continue to monitor patient advancement through interdisciplinary progression rounds. If new patient transition needs arise, please place a TOC consult.

## 2022-11-17 NOTE — TOC CAGE-AID Note (Signed)
Transition of Care [facility]) - CAGE-AID Screening   Patient Details  Name: Melinda [name] MRN: [id number] Date of Birth: 11-07-1955  Transition of Care [facility]) CM/SW Contact:    Bethann Berkshire, [place] Phone Number: 11/17/2022, 9:16 AM   CAGE-AID Screening: Substance Abuse Screening unable to be completed due to: : Patient unable to participate

## 2022-11-17 NOTE — Anesthesia Preprocedure Evaluation (Signed)
Anesthesia Evaluation  Patient identified by MRN, date of birth, ID band Patient unresponsive  Preop documentation limited or incomplete due to emergent nature of procedure.  Airway Mallampati: Unable to assess       Dental   Pulmonary pneumonia, former smoker    + decreased breath sounds      Cardiovascular  Rhythm:regular Rate:Normal     Neuro/Psych Code stroke emergency CVA    GI/Hepatic   Endo/Other    Renal/GU      Musculoskeletal   Abdominal   Peds  Hematology   Anesthesia Other Findings   Reproductive/Obstetrics                              Anesthesia Physical Anesthesia Plan  ASA: 4 and emergent  Anesthesia Plan: General   Post-op Pain Management:    Induction: Intravenous  PONV Risk Score and Plan: 3 and Ondansetron, Midazolam, Dexamethasone and Treatment may vary due to age or medical condition  Airway Management Planned: Oral ETT  Additional Equipment: Arterial line  Intra-op Plan:   Post-operative Plan: Post-operative intubation/ventilation  Informed Consent: I have reviewed the patients History and Physical, chart, labs and discussed the procedure including the risks, benefits and alternatives for the proposed anesthesia with the patient or authorized representative who has indicated his/her understanding and acceptance.     Dental advisory given  Plan Discussed with: CRNA, Anesthesiologist and Surgeon  Anesthesia Plan Comments:          Anesthesia Quick Evaluation

## 2022-11-17 NOTE — Progress Notes (Signed)
The bilateral wrist restraint order should be for the right wrist only. Pt unable to effectively use the left arm at this time.   Only used the right wrist restraint for the duration of the order

## 2022-11-17 NOTE — Progress Notes (Signed)
[facility] ADULT ICU REPLACEMENT PROTOCOL   The patient does apply for the [facility] Adult ICU Electrolyte Replacment Protocol based on the criteria listed below:   1.Exclusion criteria: TCTS, ECMO, Dialysis, and Myasthenia Gravis patients 2. Is GFR >/= 30 ml/min? Yes.    Patient's GFR today is >60 3. Is SCr </= 2? Yes.   Patient's SCr is 0.6 mg/dL 4. Did SCr increase >/= 0.5 in 24 hours? No. 5.Pt's weight >40kg  Yes.   6. Abnormal electrolyte(s):   K 3.3  7. Electrolytes replaced per protocol 8.  Call MD STAT for K+ </= 2.5, Phos </= 1, or Mag </= 1 Physician:  Lowella Bandy R Evann Koelzer 11/17/2022 3:16 AM

## 2022-11-17 NOTE — Progress Notes (Signed)
NAME:  Melinda [name], MRN:  [id number], DOB:  [birth year], LOS: 3 ADMISSION DATE:  12/01/2022, CONSULTATION DATE:  11/16/2022 REFERRING MD:  Estanislado Pandy, CHIEF COMPLAINT: Left-sided weakness  History of Present Illness:  67 y/o female admitted on Dec 01, 2022 in the context of cough, vomiting.  Found to be flu a positive.  Admitted by the hospitalist, treated with 2 L nasal cannula, Tamiflu.  On January 17 patient was noted to have new onset left-sided weakness.  During the day on January 17 she ambulated with assistance in the hallway and required 5 L nasal cannula oxygen.  This is around 1:30 in the afternoon.  Code stroke was called at 8 PM when she was noted to have left-sided weakness and confusion.  She had been last seen normal at 530pm.  Telemetry neuro was consulted (the patient was admitted to [facility]) and further head imaging was ordered including a CT scan, CT angiogram, and CT perfusion study.  This showed a right M1 and P2 occlusion of right MCA, area of infarct in right MCA territory noted.  Study was noted to be motion degraded.  Interventional radiology was consulted and she was transferred from [facility] to [facility] where she underwent a thrombectomy the evening of January 17.  The patient was intubated and sedated for the procedure, pulmonary and critical care medicine was consulted for ventilator management.   Pertinent  Medical History   Past Medical History:  Diagnosis Date   Reflux    Sprain of neck 11/24/2013   Significant Hospital Events: Including procedures, antibiotic start and stop dates in addition to other pertinent events   1/17 Status post revascularization of the right middle cerebral artery distribution with 2 passes with complex aspiration, and 1 pass with contact aspiration- achieving aTICI 2C revascularization.  1/18  Head CT - Evolving large acute right MCA and PCA infarcts with mildly increased mass effect including 6 mm of  leftward midline shift. Nonprogressive petechial hemorrhage and/or contrast staining in the right basal ganglia. Unchanged small acute right cerebellar infarct. 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  Right lower extremity becoming more cyanotic  Interim History / Subjective:  Sedated on ventilator  Objective   Blood pressure 127/69, pulse 73, temperature (!) 97 F (36.1 C), resp. rate (!) 28, height 5\' 3"  (1.6 m), weight 76.4 kg, SpO2 96 %.    Vent Mode: PRVC FiO2 (%):  [60 %-70 %] 70 % Set Rate:  [30 bmp] 30 bmp Vt Set:  [420 mL] 420 mL PEEP:  [10 cmH20] 10 cmH20 Plateau Pressure:  [25 cmH20] 25 cmH20   Intake/Output Summary (Last 24 hours) at 11/17/2022 0723 Last data filed at 11/17/2022 0600 Gross per 24 hour  Intake 3225.66 ml  Output 1900 ml  Net 1325.66 ml    Filed Weights   12/01/22 0508 10/30/2022 2106 11/17/22 0500  Weight: 70.4 kg 72.3 kg 76.4 kg    Examination: General: Acute ill-appearing middle-aged female lying in bed on mechanical ventilation in no acute distress HEENT: ETT, MM pink/moist, PERRL,  Neuro: Sedated on ventilator, will withdraw to pain in all extremities except left upper, unable to follow commands CV: s1s2 regular rate and rhythm, no murmur, rubs, or gallops,  PULM: Clear to auscultation bilaterally, no increased work of breathing, no added breath sounds GI: soft, bowel sounds active in all  4 quadrants, non-tender, non-distended Extremities: Right lower extremity cool to touch and cyanotic, no wounds Skin: no rashes or lesions  Resolved Hospital Problem list     Assessment & Plan:  Acute right MCA stroke s/p thrombectomy -CT with extensive changes on the right P: Primary management per neurology  Maintain neuro protective measures; goal for eurothermia, euglycemia, eunatermia, normoxia, and PCO2 goal of  35-40 Nutrition and bowel regiment  Seizure precautions  Aspirations precautions  3% saline per neurology Further imaging per neurology Frequent neurochecks Continue Clevipriex for tight bp control   Acute hypoxemic respiratory failure secondary to influenza A pneumonia complicated by recent stroke Concern for ARDS P: Continue ventilator support with lung protective strategies  Wean PEEP and FiO2 for sats greater than 90%. Head of bed elevated 30 degrees. Plateau pressures less than 30 cm H20.  Follow intermittent chest x-ray and ABG.   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  No surgical interventions available, comfort care recommended Supportive care  History of GERD P: Continue Famotidine   Mild hypokalemia P: Supplement as needed Trend bmet  Anxiety  P: Continue scheduled xanax per tube   Best Practice (right click and "Reselect all SmartList Selections" daily)   Diet/type: tubefeeds DVT prophylaxis: SCD GI prophylaxis: PPI Lines: N/A Foley:  N/A Code Status:  full code Last date of multidisciplinary goals of care discussion : Family meeting planned for 1/19 at [place] Performed by: Adanya Sosinski D. Harris   Total critical care time: 40 minutes  Critical care time was exclusive of separately billable procedures and treating other patients.  Critical care was necessary to treat or prevent imminent or life-threatening deterioration.  Critical care was time spent personally by me on the following activities: development of  treatment plan with patient and/or surrogate as well as nursing, discussions with consultants, evaluation of patient's response to treatment, examination of patient, obtaining history from patient or surrogate, ordering and performing treatments and interventions, ordering and review of laboratory studies, ordering and review of radiographic studies, pulse oximetry and re-evaluation of patient's condition.   Lorain Keast D. Harris, NP-C [facility] Personal contact information can be found on Amion  If no contact or response made please call [phone] 11/17/2022, 7:46 AM

## 2022-11-17 NOTE — Progress Notes (Signed)
SLP Cancellation Note  Patient Details Name: Melinda [name] MRN: [id number] DOB: [birth year]   Cancelled treatment:       Reason Eval/Treat Not Completed: Patient not medically ready (on vent). Will f/u as able.    Osie Bond., M.A. [place] Office [phone]  Secure chat preferred  11/17/2022, 7:52 AM

## 2022-11-17 NOTE — Progress Notes (Signed)
Chaplain met family after decision made to move patient to comfort care.   Her 4 children, boyfriend, mother and granddaughter were bedside.  Chaplain provided words of scripture, words of comfort and prayer.  Family expressed gratitude. Chaplain continues to be available if needed more. Rev. Tamsen Snider Pager [phone]

## 2022-11-17 NOTE — Progress Notes (Signed)
STROKE TEAM PROGRESS NOTE   INTERVAL HISTORY Intubated.  Neurological exam remains poor.  She barely opens eyes to stimulation and can localize the right upper and lower extremities with trace withdrawal in the left lower extremity.  Multiple family members at the bedside. Vitals:   11/17/22 1100 11/17/22 1200 11/17/22 1300 11/17/22 1455  BP: (!) 109/58 127/64 134/64   Pulse: 94 86 83   Resp: (!) 27 (!) 25 (!) 27 (!) 34  Temp: (!) 97.3 F (36.3 C) (!) 97.3 F (36.3 C) (!) 97.5 F (36.4 C)   TempSrc:      SpO2: 92% 95% 96%   Weight:      Height:       CBC:  Recent Labs  Lab 11/10/2022 0534 11/01/2022 1956 11/16/22 0337 11/17/22 0236  WBC 9.2   < > 9.6  9.2 8.4  NEUTROABS 7.6  --  8.0*  --   HGB 14.2   < > 10.6*  10.6* 10.0*  HCT 41.9   < > 31.8*  32.4* 30.6*  MCV 88.8   < > 94.1  93.4 93.6  PLT 174   < > 231  232 208   < > = values in this interval not displayed.   Basic Metabolic Panel:  Recent Labs  Lab 11/16/22 0800 11/16/22 1513 11/16/22 2033 11/17/22 0236 11/17/22 0446 11/17/22 0954  NA 140 143   < > 149*  --  156*  K 4.2  --   --  3.3*  --   --   CL 106  --   --  114*  --   --   CO2 26  --   --  27  --   --   GLUCOSE 129*  --   --  214*  --   --   BUN 6*  --   --  7*  --   --   CREATININE 0.58  --   --  0.60  --   --   CALCIUM 7.5*  --   --  7.6*  --   --   MG  --  2.1  --  2.3 2.3  --   PHOS  --  2.1*  --   --  2.7  --    < > = values in this interval not displayed.   Lipid Panel:  Recent Labs  Lab 11/16/22 0337 11/17/22 0446  CHOL 153  --   TRIG 330* 363*  HDL 22*  --   CHOLHDL 7.0  --   VLDL 66*  --   LDLCALC 65  --    HgbA1c:  Recent Labs  Lab 11/16/22 0337  HGBA1C 5.9*   Urine Drug Screen: No results for input(s): "LABOPIA", "COCAINSCRNUR", "LABBENZ", "AMPHETMU", "THCU", "LABBARB" in the last 168 hours.  Alcohol Level No results for input(s): "ETH" in the last 168 hours.  IMAGING past 24 hours No results found.  PHYSICAL  EXAM  Constitutional: Middle-age Caucasian lady is sedated and intubated cardiovascular: Normal rate and regular rhythm.  Respiratory: Mechanically ventilated, effort normal, non-labored breathing  Neuro: Mental Status: Intubated, sedated with propofol.  Eyes are closed.  Aphasic. Not following commands.     Cranial Nerves: PERRL 75mm and sluggish, eyes are midline. Head to the right. Cough, gag, corneals intact Motor/Sensory: Withdraws to pain briskly on the right. Slower on the left lower extremity. No movement noted in left upper extremity    ASSESSMENT/PLAN Ms. TELIA AMUNDSON is a 67 y.o.  female with history of GERD, tobacco use, anxiety, who is admitted at [facility] for hypoxic respiratory failure and influenza A positive presenting with dysarthria and left-sided weakness. She was initially admitted to [facility].  Yesterday she developed slurred speech and left-sided weakness.  A code stroke was subsequently called and she was found to have a right MCA occlusion.  She was transferred to [facility] for an emergent thrombectomy.  She is status post revascularization of the right MCA with tiki 2C revascularization.  Required manual compression of her puncture site.  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  Vascular surgery has been consulted  Stroke:  Right MCA infarct s/p mechanical thrombectomy with TICI2c revascularization Etiology: right MCA occlusion   Code Stroke CT head hypodensity in the right anterior temporal lobe, ASPECTS 8 CTA head & neck Right M1 occlusion CT perfusion consistent with right MCA territory infarct, with 147 mL infarct core and 42 mL mismatch volume. 1/18 - 0416 CT Head Established acute infarct involving most of the right MCA/PCA cortex. Smaller acute infarct in the right cerebellum. Petechial hemorrhage or contrast staining at the right basal ganglia Post IR CT large evolving  early infarct of the right MCA distribution with petechial hemorrhage, mild right perisylvian subarachnoid hyperattenuation MRI pending 2D Echo EF is 65-70%, left atrial size moderately dilated LDL 65 HgbA1c 5.9 VTE prophylaxis -SCDs    Diet   Diet NPO time specified   No antithrombotic prior to admission, now on No antithrombotic.  Therapy recommendations: Pending Disposition: Pending  Brain compression Cerebral edema with effacement of the ventricles Hypertonic saline at 6ml/hr  Dissection of the right common iliac artery with occlusion of the P2 segment of the popliteal artery  VVS consulted Not a candidate for revascularization  Hypertension Home meds: None BP goal 120-140 for 24 hours per N IR Long-term BP goal normotensive  Acute hypoxic respiratory failure Influenza pneumonia Hx of COPD Intubated, sedated with propofol and fentanyl CCM to manage ventilator Tamiflu Respiratory culture sent Empiric antibiotics-ceftriaxone, azithromycin  Other Stroke Risk Factors Advanced Age >/= 46  Former cigarette smoker  Other Active Problems GERD-resume famotidine  Hospital day # 3  Patient presented with large right MCA infarct and underwent thrombectomy with partial revascularization and CT scan shows large right MCA infarct with already early cytotoxic edema and midline shift.  Prognosis seems quite poor.  She is also developed right lower extremity ischemia and surgical revascularization required using IV heparin which is likely to increase chances of hemorrhagic transformation and vascular surgery declined surgical revascularization.  Patient neurological exam remains poor and had an extensive discussion with patient's 3 daughters son and other family members along with critical care nurse practitioner family understood the poor prognosis and clearly stated she would not like to live a life or major disability which is unavoidable with 24-hour nursing care likely in nursing  home.  They agree to DNR and comfort care measures only..This patient is critically ill and at significant risk of neurological worsening, death and care requires constant monitoring of vital signs, hemodynamics,respiratory and cardiac monitoring, extensive review of multiple databases, frequent neurological assessment, discussion with family, other specialists and medical decision making of high complexity.I have made any additions or clarifications directly to the above note.This critical care time does not reflect procedure time, or teaching time or supervisory time of PA/NP/Med Resident etc but could involve care discussion time.  I  spent 55 minutes of neurocritical care time  in the care of  this patient.      Antony Contras, MD Medical Director [facility] Pager: [phone] 11/17/2022 3:20 PM  To contact Stroke Continuity provider, please refer to [email or web address] After hours, contact General Neurology

## 2022-11-17 NOTE — Progress Notes (Addendum)
[place] Progress Note Patient Name: DEJANEE THIBEAUX DOB: Aug 09, 1956 MRN: [id number]   Date of Service  11/17/2022  HPI/Events of Note  Patient is intubated and mechanically ventilated, and at risk of self-extubation.  eICU Interventions  Bilateral soft wrist restraint order renewed.        Blimie Vaness U Abrahim Sargent 11/17/2022, 12:06 AM

## 2022-11-17 NOTE — IPAL (Signed)
  Interdisciplinary Goals of Care Family Meeting   Date carried out: 11/17/2022  Location of the meeting: Conference room  Member's involved: Physician, Nurse Practitioner, Bedside Registered Nurse, and Family Member or next of kin  Durable Power of Attorney or acting medical decision maker: Shared among family     Discussion: Goals of care discussed with family at length with Stroke team. All questions answered and family wishes to proceed with comfort care once all family has had a chance to visit with patient. Family will notify team when ready to transition.   Code status: Full DNR  Disposition: Continue current acute care with transition to comfort   Time spent for the meeting: 40 min   Melinda Bowell D. Harris, NP-C [facility] Personal contact information can be found on Amion  If no contact or response made please call [phone] 11/17/2022, 12:24 PM

## 2022-11-17 NOTE — Progress Notes (Signed)
Attempted to see pt. Pt continues to not be medically ready. Will reattempt in am as schedule permits. Jinger Neighbors, [place] [phone]

## 2022-11-17 NOTE — Progress Notes (Signed)
Pt transported to and from CT scan on the ventilator without incident. 

## 2022-11-18 ENCOUNTER — Inpatient Hospital Stay (HOSPITAL_COMMUNITY): Payer: 59

## 2022-11-18 ENCOUNTER — Encounter (HOSPITAL_COMMUNITY): Payer: Self-pay | Admitting: Anesthesiology

## 2022-11-18 ENCOUNTER — Encounter (HOSPITAL_COMMUNITY): Admission: EM | Disposition: E | Payer: Self-pay | Source: Home / Self Care | Attending: Neurology

## 2022-11-18 HISTORY — PX: ORGAN PROCUREMENT: SHX5270

## 2022-11-18 LAB — URINALYSIS, ROUTINE W REFLEX MICROSCOPIC
Bilirubin Urine: NEGATIVE
Glucose, UA: NEGATIVE mg/dL
Hgb urine dipstick: NEGATIVE
Ketones, ur: NEGATIVE mg/dL
Leukocytes,Ua: NEGATIVE
Nitrite: NEGATIVE
Protein, ur: NEGATIVE mg/dL
Specific Gravity, Urine: 1.024 (ref 1.005–1.030)
pH: 5 (ref 5.0–8.0)

## 2022-11-18 LAB — COMPREHENSIVE METABOLIC PANEL
ALT: 40 U/L (ref 0–44)
ALT: 41 U/L (ref 0–44)
AST: 50 U/L — ABNORMAL HIGH (ref 15–41)
AST: 57 U/L — ABNORMAL HIGH (ref 15–41)
Albumin: 1.8 g/dL — ABNORMAL LOW (ref 3.5–5.0)
Albumin: 1.7 g/dL — ABNORMAL LOW (ref 3.5–5.0)
Alkaline Phosphatase: 69 U/L (ref 38–126)
Alkaline Phosphatase: 67 U/L (ref 38–126)
Anion gap: 9 (ref 5–15)
Anion gap: 10 (ref 5–15)
BUN: 18 mg/dL (ref 8–23)
BUN: 20 mg/dL (ref 8–23)
CO2: 34 mmol/L — ABNORMAL HIGH (ref 22–32)
CO2: 33 mmol/L — ABNORMAL HIGH (ref 22–32)
Calcium: 7.9 mg/dL — ABNORMAL LOW (ref 8.9–10.3)
Calcium: 7.9 mg/dL — ABNORMAL LOW (ref 8.9–10.3)
Chloride: 115 mmol/L — ABNORMAL HIGH (ref 98–111)
Chloride: 113 mmol/L — ABNORMAL HIGH (ref 98–111)
Creatinine, Ser: 0.75 mg/dL (ref 0.44–1.00)
Creatinine, Ser: 0.68 mg/dL (ref 0.44–1.00)
GFR, Estimated: >60 mL/min (ref >60)
GFR, Estimated: >60 mL/min (ref >60)
Glucose, Bld: 110 mg/dL — ABNORMAL HIGH (ref 70–99)
Glucose, Bld: 119 mg/dL — ABNORMAL HIGH (ref 70–99)
Potassium: 4.2 mmol/L (ref 3.5–5.1)
Potassium: 4 mmol/L (ref 3.5–5.1)
Sodium: 158 mmol/L — ABNORMAL HIGH (ref 135–145)
Sodium: 156 mmol/L — ABNORMAL HIGH (ref 135–145)
Total Bilirubin: 0.4 mg/dL (ref 0.3–1.2)
Total Bilirubin: 0.5 mg/dL (ref 0.3–1.2)
Total Protein: 5.3 g/dL — ABNORMAL LOW (ref 6.5–8.1)
Total Protein: 5.4 g/dL — ABNORMAL LOW (ref 6.5–8.1)

## 2022-11-18 LAB — CBC
HCT: 29 % — ABNORMAL LOW (ref 36.0–46.0)
Hemoglobin: 9.5 g/dL — ABNORMAL LOW (ref 12.0–15.0)
MCH: 31.6 pg (ref 26.0–34.0)
MCHC: 32.8 g/dL (ref 30.0–36.0)
MCV: 96.3 fL (ref 80.0–100.0)
Platelets: 153 10*3/uL (ref 150–400)
RBC: 3.01 MIL/uL — ABNORMAL LOW (ref 3.87–5.11)
RDW: 15.6 % — ABNORMAL HIGH (ref 11.5–15.5)
WBC: 11.6 10*3/uL — ABNORMAL HIGH (ref 4.0–10.5)
nRBC: 0.3 % — ABNORMAL HIGH (ref 0.0–0.2)

## 2022-11-18 LAB — CBC WITH DIFFERENTIAL/PLATELET
Abs Immature Granulocytes: 0.18 10*3/uL — ABNORMAL HIGH (ref 0.00–0.07)
Abs Immature Granulocytes: 0.2 10*3/uL — ABNORMAL HIGH (ref 0.00–0.07)
Basophils Absolute: 0 10*3/uL (ref 0.0–0.1)
Basophils Absolute: 0 10*3/uL (ref 0.0–0.1)
Basophils Relative: 0 %
Basophils Relative: 0 %
Eosinophils Absolute: 0 10*3/uL (ref 0.0–0.5)
Eosinophils Absolute: 0 10*3/uL (ref 0.0–0.5)
Eosinophils Relative: 0 %
Eosinophils Relative: 0 %
HCT: 29.9 % — ABNORMAL LOW (ref 36.0–46.0)
HCT: 30.9 % — ABNORMAL LOW (ref 36.0–46.0)
Hemoglobin: 9.3 g/dL — ABNORMAL LOW (ref 12.0–15.0)
Hemoglobin: 9.7 g/dL — ABNORMAL LOW (ref 12.0–15.0)
Immature Granulocytes: 2 %
Immature Granulocytes: 2 %
Lymphocytes Relative: 8 %
Lymphocytes Relative: 9 %
Lymphs Abs: 1 10*3/uL (ref 0.7–4.0)
Lymphs Abs: 1.2 10*3/uL (ref 0.7–4.0)
MCH: 30.8 pg (ref 26.0–34.0)
MCH: 31 pg (ref 26.0–34.0)
MCHC: 31.1 g/dL (ref 30.0–36.0)
MCHC: 31.4 g/dL (ref 30.0–36.0)
MCV: 99 fL (ref 80.0–100.0)
MCV: 98.7 fL (ref 80.0–100.0)
Monocytes Absolute: 0.6 10*3/uL (ref 0.1–1.0)
Monocytes Absolute: 0.6 10*3/uL (ref 0.1–1.0)
Monocytes Relative: 5 %
Monocytes Relative: 5 %
Neutro Abs: 10.1 10*3/uL — ABNORMAL HIGH (ref 1.7–7.7)
Neutro Abs: 11.4 10*3/uL — ABNORMAL HIGH (ref 1.7–7.7)
Neutrophils Relative %: 85 %
Neutrophils Relative %: 84 %
Platelets: 144 10*3/uL — ABNORMAL LOW (ref 150–400)
Platelets: 169 10*3/uL (ref 150–400)
RBC: 3.02 MIL/uL — ABNORMAL LOW (ref 3.87–5.11)
RBC: 3.13 MIL/uL — ABNORMAL LOW (ref 3.87–5.11)
RDW: 15.6 % — ABNORMAL HIGH (ref 11.5–15.5)
RDW: 15.5 % (ref 11.5–15.5)
WBC: 11.9 10*3/uL — ABNORMAL HIGH (ref 4.0–10.5)
WBC: 13.5 10*3/uL — ABNORMAL HIGH (ref 4.0–10.5)
nRBC: 0 % (ref 0.0–0.2)
nRBC: 0 % (ref 0.0–0.2)

## 2022-11-18 LAB — DIFFERENTIAL
Abs Immature Granulocytes: 0.18 10*3/uL — ABNORMAL HIGH (ref 0.00–0.07)
Basophils Absolute: 0 10*3/uL (ref 0.0–0.1)
Basophils Relative: 0 %
Eosinophils Absolute: 0 10*3/uL (ref 0.0–0.5)
Eosinophils Relative: 0 %
Immature Granulocytes: 2 %
Lymphocytes Relative: 9 %
Lymphs Abs: 1.1 10*3/uL (ref 0.7–4.0)
Monocytes Absolute: 0.6 10*3/uL (ref 0.1–1.0)
Monocytes Relative: 5 %
Neutro Abs: 9.7 10*3/uL — ABNORMAL HIGH (ref 1.7–7.7)
Neutrophils Relative %: 84 %

## 2022-11-18 LAB — PHOSPHORUS: Phosphorus: 3 mg/dL (ref 2.5–4.6)

## 2022-11-18 LAB — POCT I-STAT 7, (LYTES, BLD GAS, ICA,H+H)
Acid-Base Excess: 10 mmol/L — ABNORMAL HIGH (ref 0.0–2.0)
Bicarbonate: 34.7 mmol/L — ABNORMAL HIGH (ref 20.0–28.0)
Calcium, Ion: 1.2 mmol/L (ref 1.15–1.40)
HCT: 27 % — ABNORMAL LOW (ref 36.0–46.0)
Hemoglobin: 9.2 g/dL — ABNORMAL LOW (ref 12.0–15.0)
O2 Saturation: 100 %
Patient temperature: 98.2
Potassium: 4.3 mmol/L (ref 3.5–5.1)
Sodium: 157 mmol/L — ABNORMAL HIGH (ref 135–145)
TCO2: 36 mmol/L — ABNORMAL HIGH (ref 22–32)
pCO2 arterial: 45.4 mmHg (ref 32–48)
pH, Arterial: 7.491 — ABNORMAL HIGH (ref 7.35–7.45)
pO2, Arterial: 199 mmHg — ABNORMAL HIGH (ref 83–108)

## 2022-11-18 LAB — RESP PANEL BY RT-PCR (RSV, FLU A&B, COVID)  RVPGX2
Influenza A by PCR: NEGATIVE
Influenza B by PCR: NEGATIVE
Resp Syncytial Virus by PCR: NEGATIVE
SARS Coronavirus 2 by RT PCR: NEGATIVE

## 2022-11-18 LAB — AMYLASE: Amylase: 32 U/L (ref 28–100)

## 2022-11-18 LAB — CK TOTAL AND CKMB (NOT AT ARMC)
CK, MB: 19.4 ng/mL — ABNORMAL HIGH (ref 0.5–5.0)
Relative Index: 1.7 (ref 0.0–2.5)
Total CK: 1141 U/L — ABNORMAL HIGH (ref 38–234)

## 2022-11-18 LAB — GLUCOSE, CAPILLARY: Glucose-Capillary: 111 mg/dL — ABNORMAL HIGH (ref 70–99)

## 2022-11-18 LAB — HEMOGLOBIN A1C
Hgb A1c MFr Bld: 6.1 % — ABNORMAL HIGH (ref 4.8–5.6)
Hgb A1c MFr Bld: 5.9 % — ABNORMAL HIGH (ref 4.8–5.6)
Mean Plasma Glucose: 128.37 mg/dL
Mean Plasma Glucose: 122.63 mg/dL

## 2022-11-18 LAB — PROTIME-INR
INR: 1.7 — ABNORMAL HIGH (ref 0.8–1.2)
INR: 1.4 — ABNORMAL HIGH (ref 0.8–1.2)
Prothrombin Time: 19.6 seconds — ABNORMAL HIGH (ref 11.4–15.2)
Prothrombin Time: 17.3 seconds — ABNORMAL HIGH (ref 11.4–15.2)

## 2022-11-18 LAB — LACTIC ACID, PLASMA: Lactic Acid, Venous: 1.9 mmol/L (ref 0.5–1.9)

## 2022-11-18 LAB — LIPASE, BLOOD: Lipase: 22 U/L (ref 11–51)

## 2022-11-18 LAB — BILIRUBIN, DIRECT: Bilirubin, Direct: 0.2 mg/dL (ref 0.0–0.2)

## 2022-11-18 LAB — ABO/RH: ABO/RH(D): O POS

## 2022-11-18 LAB — APTT
aPTT: 35 seconds (ref 24–36)
aPTT: 27 seconds (ref 24–36)

## 2022-11-18 LAB — MAGNESIUM: Magnesium: 2.6 mg/dL — ABNORMAL HIGH (ref 1.7–2.4)

## 2022-11-18 LAB — PREPARE RBC (CROSSMATCH)

## 2022-11-18 LAB — SODIUM: Sodium: 156 mmol/L — ABNORMAL HIGH (ref 135–145)

## 2022-11-18 SURGERY — SURGICAL PROCUREMENT, ORGAN
Anesthesia: Choice

## 2022-11-18 MED ORDER — PIPERACILLIN-TAZOBACTAM 3.375 G IVPB
3.3750 g | Freq: Once | INTRAVENOUS | Status: AC
  Administered 2022-11-18: 3.375 g via INTRAVENOUS
  Filled 2022-11-18: qty 50

## 2022-11-18 MED ORDER — SODIUM CHLORIDE 0.9% IV SOLUTION: Freq: Once | INTRAVENOUS | Status: DC

## 2022-11-18 MED ORDER — CIPROFLOXACIN IN D5W 400 MG/200ML IV SOLN
400.0000 mg | INTRAVENOUS | Status: AC
  Administered 2022-11-18: 400 mg via INTRAVENOUS
  Filled 2022-11-18: qty 200

## 2022-11-18 MED ORDER — CLEVIDIPINE BUTYRATE 0.5 MG/ML IV EMUL
INTRAVENOUS | Status: AC
  Filled 2022-11-18: qty 100

## 2022-11-18 MED ORDER — HEPARIN SODIUM 10000 UNIT/ML FOR ORGAN DONATION
100.0000 [IU]/kg | INTRAMUSCULAR | Status: DC
  Filled 2022-11-18: qty 1

## 2022-11-18 MED ORDER — HEPARIN SODIUM (PORCINE) 1000 UNIT/ML IJ SOLN: 7700.0000 [IU] | Freq: Once | INTRAMUSCULAR | Status: DC

## 2022-11-18 MED ORDER — DESMOPRESSIN ACETATE 4 MCG/ML IJ SOLN
1.0000 ug | Freq: Once | INTRAMUSCULAR | Status: AC
  Administered 2022-11-18: 1 ug via INTRAVENOUS
  Filled 2022-11-18 (×2): qty 1

## 2022-11-18 MED ORDER — HEPARIN (PORCINE) 25000 UT/250ML-% IV SOLN: 100.0000 [IU]/kg/h | Freq: Once | INTRAVENOUS | Status: DC

## 2022-11-18 MED ORDER — SODIUM CHLORIDE 0.9 % IV SOLN: INTRAVENOUS | Status: DC | PRN

## 2022-11-18 MED ORDER — FREE WATER
200.0000 mL | Status: DC
  Administered 2022-11-18 (×5): 200 mL

## 2022-11-18 MED ORDER — VANCOMYCIN HCL 1500 MG/300ML IV SOLN
1500.0000 mg | INTRAVENOUS | Status: AC
  Administered 2022-11-18: 1500 mg via INTRAVENOUS
  Filled 2022-11-18: qty 300

## 2022-11-18 MED ORDER — CLEVIDIPINE BUTYRATE 0.5 MG/ML IV EMUL
0.0000 mg/h | INTRAVENOUS | Status: DC
  Administered 2022-11-18: 8 mg/h via INTRAVENOUS
  Administered 2022-11-18: 2 mg/h via INTRAVENOUS
  Filled 2022-11-18: qty 50

## 2022-11-18 SURGICAL SUPPLY — 87 items
APPLIER CLIP 11 MED OPEN (CLIP) ×1
APR CLP MED 11 20 MLT OPN (CLIP) ×1
BAG COUNTER SPONGE SURGICOUNT (BAG) ×1 IMPLANT
BAG SPNG CNTER NS LX DISP (BAG) ×1
BLADE CLIPPER SURG (BLADE) IMPLANT
BLADE SAW STERNAL (BLADE) ×1 IMPLANT
BLADE SURG 10 STRL SS (BLADE) IMPLANT
CLIP APPLIE 11 MED OPEN (CLIP) ×1 IMPLANT
CLIP VESOCCLUDE MED 24/CT (CLIP) IMPLANT
CLIP VESOCCLUDE SM WIDE 24/CT (CLIP) IMPLANT
CNTNR URN SCR LID CUP LEK RST (MISCELLANEOUS) ×1 IMPLANT
CONT SPEC 4OZ STRL OR WHT (MISCELLANEOUS) ×3
COVER BACK TABLE 60X90IN (DRAPES) IMPLANT
COVER MAYO STAND STRL (DRAPES) IMPLANT
COVER SURGICAL LIGHT HANDLE (MISCELLANEOUS) ×1 IMPLANT
DRAPE HALF SHEET 40X57 (DRAPES) IMPLANT
DRAPE SLUSH MACHINE 52X66 (DRAPES) ×1 IMPLANT
DRSG COVADERM 4X10 (GAUZE/BANDAGES/DRESSINGS) IMPLANT
DRSG TELFA 3X8 NADH STRL (GAUZE/BANDAGES/DRESSINGS) ×1 IMPLANT
DURAPREP 26ML APPLICATOR (WOUND CARE) IMPLANT
ELECT BLADE 6.5 EXT (BLADE) IMPLANT
ELECT REM PT RETURN 9FT ADLT (ELECTROSURGICAL) ×2
ELECTRODE REM PT RTRN 9FT ADLT (ELECTROSURGICAL) ×2 IMPLANT
FELT TEFLON 1X6 (MISCELLANEOUS) IMPLANT
GAUZE 4X4 16PLY ~~LOC~~+RFID DBL (SPONGE) IMPLANT
GLOVE BIO SURGEON STRL SZ7 (GLOVE) IMPLANT
GLOVE BIO SURGEON STRL SZ7.5 (GLOVE) IMPLANT
GLOVE BIO SURGEON STRL SZ8 (GLOVE) IMPLANT
GLOVE BIO SURGEON STRL SZ8.5 (GLOVE) IMPLANT
GLOVE BIOGEL PI IND STRL 7.0 (GLOVE) IMPLANT
GLOVE BIOGEL PI IND STRL 7.5 (GLOVE) IMPLANT
GLOVE BIOGEL PI IND STRL 8 (GLOVE) IMPLANT
GLOVE BIOGEL PI IND STRL 8.5 (GLOVE) IMPLANT
GLOVE SURG SS PI 7.0 STRL IVOR (GLOVE) IMPLANT
GLOVE SURG SS PI 7.5 STRL IVOR (GLOVE) IMPLANT
GLOVE SURG SS PI 8.0 STRL IVOR (GLOVE) IMPLANT
GOWN STRL REUS W/ TWL LRG LVL3 (GOWN DISPOSABLE) ×4 IMPLANT
GOWN STRL REUS W/ TWL XL LVL3 (GOWN DISPOSABLE) ×2 IMPLANT
GOWN STRL REUS W/TWL LRG LVL3 (GOWN DISPOSABLE) ×4
GOWN STRL REUS W/TWL XL LVL3 (GOWN DISPOSABLE) ×2
HANDLE SUCTION POOLE (INSTRUMENTS) IMPLANT
KIT POST MORTEM ADULT 36X90 (BAG) ×1 IMPLANT
KIT TURNOVER KIT B (KITS) ×1 IMPLANT
LOOP VESSEL MAXI BLUE (MISCELLANEOUS) IMPLANT
LOOP VESSEL MINI RED (MISCELLANEOUS) IMPLANT
MANIFOLD NEPTUNE II (INSTRUMENTS) ×1 IMPLANT
NDL BIOPSY 14X6 SOFT TISS (NEEDLE) IMPLANT
NEEDLE BIOPSY 14X6 SOFT TISS (NEEDLE) IMPLANT
NS IRRIG 1000ML POUR BTL (IV SOLUTION) IMPLANT
PACK AORTA (CUSTOM PROCEDURE TRAY) ×1 IMPLANT
PAD ARMBOARD 7.5X6 YLW CONV (MISCELLANEOUS) ×2 IMPLANT
PENCIL BUTTON HOLSTER BLD 10FT (ELECTRODE) ×1 IMPLANT
SOL PREP POV-IOD 4OZ 10% (MISCELLANEOUS) ×2 IMPLANT
SPONGE INTESTINAL PEANUT (DISPOSABLE) IMPLANT
SPONGE T-LAP 18X18 ~~LOC~~+RFID (SPONGE) IMPLANT
STAPLER VISISTAT 35W (STAPLE) ×1 IMPLANT
SUCTION POOLE HANDLE (INSTRUMENTS)
SUT BONE WAX W31G (SUTURE) IMPLANT
SUT ETHIBOND 5 LR DA (SUTURE) IMPLANT
SUT ETHILON 1 LR 30 (SUTURE) ×2 IMPLANT
SUT ETHILON 2 LR (SUTURE) IMPLANT
SUT PROLENE 3 0 RB 1 (SUTURE) IMPLANT
SUT PROLENE 3 0 SH 1 (SUTURE) IMPLANT
SUT PROLENE 4 0 RB 1 (SUTURE)
SUT PROLENE 4-0 RB1 .5 CRCL 36 (SUTURE) IMPLANT
SUT PROLENE 5 0 C 1 24 (SUTURE) IMPLANT
SUT PROLENE 6 0 BV (SUTURE) IMPLANT
SUT SILK 0 TIES 10X30 (SUTURE) IMPLANT
SUT SILK 1 SH (SUTURE) IMPLANT
SUT SILK 1 TIES 10X30 (SUTURE) IMPLANT
SUT SILK 2 0 (SUTURE) ×2
SUT SILK 2 0 SH (SUTURE) IMPLANT
SUT SILK 2 0 SH CR/8 (SUTURE) IMPLANT
SUT SILK 2 0 TIES 10X30 (SUTURE) IMPLANT
SUT SILK 2-0 18XBRD TIE 12 (SUTURE) IMPLANT
SUT SILK 3 0 (SUTURE) ×2
SUT SILK 3 0 SH CR/8 (SUTURE) IMPLANT
SUT SILK 3 0 TIES 10X30 (SUTURE) IMPLANT
SUT SILK 3-0 18XBRD TIE 12 (SUTURE) IMPLANT
SWAB COLLECTION DEVICE MRSA (MISCELLANEOUS) IMPLANT
SWAB CULTURE ESWAB REG 1ML (MISCELLANEOUS) IMPLANT
SYR 50ML LL SCALE MARK (SYRINGE) IMPLANT
SYRINGE TOOMEY DISP (SYRINGE) IMPLANT
TAPE UMBILICAL 1/8 X36 TWILL (MISCELLANEOUS) IMPLANT
TUBE CONNECTING 12X1/4 (SUCTIONS) ×1 IMPLANT
WATER STERILE IRR 1000ML POUR (IV SOLUTION) IMPLANT
YANKAUER SUCT BULB TIP NO VENT (SUCTIONS) ×1 IMPLANT

## 2022-11-19 LAB — URINE CULTURE: Culture: NO GROWTH

## 2022-11-19 LAB — BPAM RBC
Blood Product Expiration Date: 202402172359
Blood Product Expiration Date: 202402172359
Blood Product Expiration Date: 202402172359
Blood Product Expiration Date: 202402172359
Blood Product Expiration Date: 202402182359
ISSUE DATE / TIME: 202401202128
ISSUE DATE / TIME: 202401202128
ISSUE DATE / TIME: 202401202128
ISSUE DATE / TIME: 202401202128
ISSUE DATE / TIME: 202401202128
Unit Type and Rh: 5100
Unit Type and Rh: 5100
Unit Type and Rh: 5100
Unit Type and Rh: 5100
Unit Type and Rh: 5100

## 2022-11-19 LAB — TYPE AND SCREEN
ABO/RH(D): O POS
Antibody Screen: NEGATIVE
Unit division: 0
Unit division: 0
Unit division: 0
Unit division: 0
Unit division: 0

## 2022-11-20 ENCOUNTER — Encounter (HOSPITAL_COMMUNITY): Payer: Self-pay

## 2022-11-20 LAB — CULTURE, RESPIRATORY W GRAM STAIN

## 2022-11-21 LAB — CULTURE, RESPIRATORY W GRAM STAIN

## 2022-11-21 LAB — SURGICAL PATHOLOGY

## 2022-11-23 LAB — CULTURE, BLOOD (ROUTINE X 2)
Culture: NO GROWTH
Culture: NO GROWTH
Special Requests: ADEQUATE
Special Requests: ADEQUATE

## 2022-11-30 NOTE — Progress Notes (Signed)
PT Cancellation Note  Patient Details Name: Melinda [name] MRN: [id number] DOB: 12/07/1955   Cancelled Treatment:    Reason Eval/Treat Not Completed: Other (comment). Pt transitioned to comfort care. PT signing off.   Lorriane Shire 11/04/2022, 8:04 AM

## 2022-11-30 NOTE — Discharge Summary (Signed)
DEATH SUMMARY   Patient Details  Name: Melinda [name] MRN: [id number] DOB: 15-Jan-1956  Admission/Discharge Information   Admit Date:  12/11/22  Date of Death: Date of Death: Dec 16, 2022  Time of Death: Time of Death: Jan 21, 2237  Length of Stay: 4  Referring Physician: Pcp, No   Reason(s) for Hospitalization  ARDS due to Influenza A  R MCA and PCA CVA s/p revascularization Acute RLE limb ischemia due to right common iliac dissection and occlusion of P2 segment popliteal artery  Diagnoses  Preliminary cause of death:  Secondary Diagnoses (including complications and co-morbidities):  Principal Problem:   Acute respiratory failure with hypoxia ([place]) Active Problems:   Anxiety   Chronic pain syndrome   Depression   H/O gastroesophageal reflux (GERD)   History of alcohol abuse   Hyperlipidemia LDL goal <130   Tobacco use disorder   Influenza A with pneumonia   Influenza   Acute right MCA stroke ([place])   Middle cerebral artery embolism, right   Brief Hospital Course (including significant findings, care, treatment, and services provided and events leading to death)  Melinda [name] is a 67 y.o. year old female who was admitted with pneumonia due to influenza a on Dec 11, 2022 whose course was complicated by R MCA stroke 1/17 s/p revascularization, ARDS due to influenza A, and ultimately acute RLE ischemia due to right common iliac dissection and occlusion of P2 segment popliteal artery. She was not candidate for intervention as her petechial hemorrhage and malignant CVA with midline shift precluded use of heparin. Decision made with family to transition to comfort measures only with plan for organ donation after cardiac death. She was pronounced dead on Dec 16, 2022 at 22:38.    Pertinent Labs and Studies  Significant Diagnostic Studies CT CHEST ABDOMEN PELVIS WO CONTRAST  Result Date: 12-16-2022 CLINICAL DATA:  Organ evaluation for donation EXAM: CT CHEST, ABDOMEN AND PELVIS WITHOUT CONTRAST  TECHNIQUE: Multidetector CT imaging of the chest, abdomen and pelvis was performed following the standard protocol without IV contrast. RADIATION DOSE REDUCTION: This exam was performed according to the departmental dose-optimization program which includes automated exposure control, adjustment of the mA and/or kV according to patient size and/or use of iterative reconstruction technique. COMPARISON:  CT AP 01/18/2021 FINDINGS: CT CHEST FINDINGS Cardiovascular: The heart size appears within normal limits. No pericardial effusion. Aortic atherosclerosis. Mild LAD coronary artery and RCA coronary artery calcifications. Mediastinum/Nodes: There is a tracheostomy tube which terminates above the carina. Enteric tube tip is identified which courses below the GE junction and terminates at the gastric antrum. No enlarged supraclavicular, axillary or mediastinal lymph nodes. Lungs/Pleura: Small bilateral pleural effusions. Diffuse ground-glass attenuation is identified throughout both lungs. Upper lung zone predominant paraseptal emphysema. Multifocal bilateral airspace disease is identified within the upper and lower lobes. This is most severe within the lower lung zones. No pneumothorax identified. Musculoskeletal: Previous ACDF within the imaged portions of the cervical spine. No acute or suspicious osseous findings. CT ABDOMEN PELVIS FINDINGS Hepatobiliary: No focal liver abnormality. Mild haziness is noted around the gallbladder. No bile duct dilatation. Pancreas: Unremarkable. No pancreatic ductal dilatation or surrounding inflammatory changes. Spleen: Normal in size without focal abnormality. Adrenals/Urinary Tract: Normal adrenal glands. Kidneys appear unremarkable. No nephrolithiasis, hydronephrosis or mass. Urinary bladder is decompressed around a Foley catheter balloon. Stomach/Bowel: Stomach appears within normal limits. The appendix is visualized and appears normal. Scattered colonic diverticula without signs of  acute diverticulitis. No pathologic dilatation of the large or small bowel loops. Vascular/Lymphatic: Aortic  atherosclerosis. No aneurysm. Chronic soft tissue stranding within the central small bowel mesentery is noted in appears similar when compared with 01/18/2021. 11 mm lymph node noted within the left lateral jejunal mesentery.No retroperitoneal or pelvic adenopathy. Reproductive: Hysterectomy.  No adnexal mass. Other: No significant free fluid or fluid collections. No signs of pneumoperitoneum. Musculoskeletal: No acute or significant osseous findings. Subcutaneous edema is identified within the body wall and lateral aspect of the upper thighs. IMPRESSION: 1. Multifocal bilateral airspace disease is identified within the upper and lower lobes. This is most severe within the lower lung zones. Small bilateral pleural effusions with diffuse ground-glass attenuation throughout both lungs. Findings are favored to represent pulmonary edema. 2. Mild haziness is noted around the gallbladder. 3. Chronic soft tissue stranding within the central small bowel mesentery is noted in appears similar when compared with 01/18/2021. 4. Subcutaneous edema is identified within the body wall and lateral aspect of the upper thighs. 5. Coronary artery calcifications noted. 6. Aortic Atherosclerosis (ICD10-I70.0) and Emphysema (ICD10-J43.9). Electronically Signed   By: Kerby Moors M.D.   On: 28-Nov-2022 11:39   DG CHEST PORT 1 VIEW  Result Date: Nov 28, 2022 CLINICAL DATA:  [id number] Organ donor [id number] EXAM: PORTABLE CHEST 1 VIEW COMPARISON:  11/16/2022 FINDINGS: Endotracheal tube is 5 cm above the carina. NG tube is in the stomach. Bilateral interstitial and airspace opacities have improved since prior study. Heart is normal size. No effusions. No acute bony abnormality. IMPRESSION: Improving bilateral interstitial and airspace opacities. Mild opacities persist. Electronically Signed   By: Rolm Baptise M.D.   On: Nov 28, 2022 09:19    CT HEAD WO CONTRAST (5MM)  Result Date: 11/17/2022 CLINICAL DATA:  Altered mental status EXAM: CT HEAD WITHOUT CONTRAST TECHNIQUE: Contiguous axial images were obtained from the base of the skull through the vertex without intravenous contrast. RADIATION DOSE REDUCTION: This exam was performed according to the departmental dose-optimization program which includes automated exposure control, adjustment of the mA and/or kV according to patient size and/or use of iterative reconstruction technique. COMPARISON:  None Available. FINDINGS: Brain: Large area of cytotoxic edema within the right MCA/PCA territory with 13 mm of leftward midline shift, which has worsened. There is downward transtentorial herniation of the right uncus. No acute hemorrhage. 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 Unchanged appearance of small right cerebellar infarct. Vascular: Calcification of the skull base vessels Skull: Normal. Negative for fracture or focal lesion. Sinuses/Orbits: No acute finding. Other: None. IMPRESSION: 1. Large area of cytotoxic edema within the right MCA/PCA territory with 13 mm of leftward midline shift, which has worsened. There is downward transtentorial herniation of the right uncus. 2. 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 3. Unchanged appearance of small right cerebellar infarct. Critical Value/emergent results were called by telephone at the time of interpretation on 11/17/2022 at 7:39 pm to provider Dr. Quinn Axe, Who verbally acknowledged these results. Electronically Signed   By: Ulyses Jarred M.D.   On: 11/17/2022 19:40   IR PERCUTANEOUS ART THROMBECTOMY/INFUSION INTRACRANIAL INC DIAG ANGIO  Result Date: 11/17/2022 INDICATION: New onset left-sided hemiplegia, slurred speech and neglect. CT angiogram demonstrating occluded right middle cerebral artery at its origin. EXAM: 1. EMERGENT LARGE VESSEL  OCCLUSION THROMBOLYSIS (anterior CIRCULATION) COMPARISON:  CT angiogram of the head and neck November 15, 2022. MEDICATIONS: Ancef 2 g IV antibiotic was administered within 1 hour of the procedure. ANESTHESIA/SEDATION: General anesthesia. CONTRAST:  Omnipaque 300 approximately 70 mL. FLUOROSCOPY  TIME:  Fluoroscopy Time: 35 minutes 12 seconds (1070 mGy). COMPLICATIONS: None immediate. TECHNIQUE: Following a full explanation of the procedure along with the potential associated complications, an informed witnessed consent was obtained. The risks of intracranial hemorrhage of 10%, worsening neurological deficit, ventilator dependency, death and inability to revascularize were all reviewed in detail with the patient's the daughter. The patient was then put under general anesthesia by the Department of Anesthesiology at [facility]. The right groin was prepped and draped in the usual sterile fashion. Thereafter using modified Seldinger technique, transfemoral access into the right common femoral artery was obtained without difficulty. Over a 0.035 inch guidewire an 8 [place] 25 cm [place] Pinnacle sheath was inserted. Through this, and also over a 0.035 inch guidewire a combination of a 5.5 [place] 125 cm Berenstein support catheter inside of an 087, 95 cm balloon guide catheter was advanced to the aortic arch region and selectively positioned in the distal right common carotid artery. The support catheter, and the guidewire were removed. Good aspiration obtained from the hub of the balloon guide catheter. A gentle control arteriogram was then performed extra cranially and intracranially. FINDINGS: The right common carotid arteriogram demonstrates the right external carotid artery and its major branches to be widely patent. The right internal carotid artery at the bulb to the cranial skull base demonstrates wide patency. The petrous, the cavernous and the supraclinoid ICA demonstrate patency. The right posterior  communicating artery is seen opacifying the right posterior cerebral artery distribution with focal areas of caliber irregularity in the right P2 region probably representing intracranial arteriosclerosis. The right anterior cerebral artery opacifies into the capillary and venous phases. The right middle cerebral artery demonstrates complete occlusion at its origin. PROCEDURE: With the balloon guide catheter now advanced to the mid 1/3 of the right ICA, an 070 132 cm aspiration catheter with inner support catheter was advanced to the supraclinoid right ICA over an 014 inch soft tip micro guidewire with a moderate J configuration. With the micro guidewire at the proximal aspect of the occluded right MCA, the combination of the free aspiration catheter with the support catheter was then advanced without difficulty to the distal aspect of the right middle cerebral artery. The guidewire, and the inner support catheter were removed. Constant aspiration was then applied at the hub of the 070 aspiration catheter for approximately 2 minutes. Thereafter with proximal flow arrest in the right internal carotid artery, the aspiration catheter was removed. A control arteriogram performed through the balloon guide catheter following reversal of flow arrest, demonstrated now opacification of the right M1 segment. A second pass was then made with the above combination this time with the 021 162 cm microcatheter advanced inside the aspiration catheter over an 014 inch soft tip Aristotle micro guidewire. The micro guidewire was then advanced into the distal M2 M3 region of the inferior division followed by the microcatheter. The guidewire was removed. Good aspiration was obtained from the hub of the microcatheter which was then connected to continuous heparinized saline infusion. A 3 mm x 20 mm Solitaire X retrieval device was then deployed in the usual manner such that the proximal portion of the device was just proximal to the  occluded distal M1 segment. Constant aspiration was applied at the hub of the 070 aspiration catheter at the origin of the inferior division, and at the hub of the balloon guide catheter with proximal flow arrest, for 2 minutes, the combination was retrieved and removed. Bits of clot were  seen in the canister, and also in the retriever. A control arteriogram performed following flow reversal now demonstrated opacification of the right M1 segment, and also the inferior division with proximal aspect of the superior division. A third pass was then made using a combination of the Vecta 46 142 cm aspiration catheter advanced with an 021 microcatheter over an 014 inch soft tip micro guidewire. The microcatheter and the wire were advanced to the M2 M3 region of the superior division. The Elam Dutch was then advanced as the combination of the micro guidewire and the microcatheter were retrieved and removed. Constant aspiration was applied for approximately a minute and a half using the Penumbra aspiration device at the hub of the Vecta to aspiration catheter. This was then retrieved and removed with aspiration applied at the hub of the balloon guide catheter. Material was seen within the canister. A control arteriogram performed through the balloon guide catheter in the right internal carotid artery demonstrated opacification of the superior division with the inferior division remaining patent. A TICI 2C revascularization was achieved. There continued to be slow flow noted in the perisylvian branches of the superior division, and also in the M4 region of the inferior division. The balloon guide catheter was removed. CT of the brain demonstrated evidence of early hyperattenuation in the right middle cerebral artery distribution indicative of an impending large ischemic infarct. Mild subarachnoid hyperattenuation was seen in the right perisylvian region. Manual compression was held with a quick clot for approximately 30 minutes in  view of failure of an 8 French Angio-Seal closure device to deploy successfully. Distal pulses were Dopplerable in both feet unchanged. Patient was left intubated in view of her clinical condition. She was transferred to the neuro ICU for post revascularization care. IMPRESSION: Status post revascularization of occluded right middle cerebral M1 segment with 2 passes with contact aspiration, and 1 pass with contact aspiration and use of a 3 mm x 20 mm Solitaire X retrieval device achieving a TICI 2C revascularization. PLAN: Follow-up as per referring MD. Electronically Signed   By: Luanne Bras M.D.   On: 11/17/2022 09:05   IR CT Head Ltd  Result Date: 11/17/2022 INDICATION: New onset left-sided hemiplegia, slurred speech and neglect. CT angiogram demonstrating occluded right middle cerebral artery at its origin. EXAM: 1. EMERGENT LARGE VESSEL OCCLUSION THROMBOLYSIS (anterior CIRCULATION) COMPARISON:  CT angiogram of the head and neck November 15, 2022. MEDICATIONS: Ancef 2 g IV antibiotic was administered within 1 hour of the procedure. ANESTHESIA/SEDATION: General anesthesia. CONTRAST:  Omnipaque 300 approximately 70 mL. FLUOROSCOPY TIME:  Fluoroscopy Time: 35 minutes 12 seconds (1070 mGy). COMPLICATIONS: None immediate. TECHNIQUE: Following a full explanation of the procedure along with the potential associated complications, an informed witnessed consent was obtained. The risks of intracranial hemorrhage of 10%, worsening neurological deficit, ventilator dependency, death and inability to revascularize were all reviewed in detail with the patient's the daughter. The patient was then put under general anesthesia by the Department of Anesthesiology at [facility]. The right groin was prepped and draped in the usual sterile fashion. Thereafter using modified Seldinger technique, transfemoral access into the right common femoral artery was obtained without difficulty. Over a 0.035 inch guidewire an 8  [place] 25 cm [place] Pinnacle sheath was inserted. Through this, and also over a 0.035 inch guidewire a combination of a 5.5 [place] 125 cm Berenstein support catheter inside of an 087, 95 cm balloon guide catheter was advanced to the aortic arch region and selectively positioned  in the distal right common carotid artery. The support catheter, and the guidewire were removed. Good aspiration obtained from the hub of the balloon guide catheter. A gentle control arteriogram was then performed extra cranially and intracranially. FINDINGS: The right common carotid arteriogram demonstrates the right external carotid artery and its major branches to be widely patent. The right internal carotid artery at the bulb to the cranial skull base demonstrates wide patency. The petrous, the cavernous and the supraclinoid ICA demonstrate patency. The right posterior communicating artery is seen opacifying the right posterior cerebral artery distribution with focal areas of caliber irregularity in the right P2 region probably representing intracranial arteriosclerosis. The right anterior cerebral artery opacifies into the capillary and venous phases. The right middle cerebral artery demonstrates complete occlusion at its origin. PROCEDURE: With the balloon guide catheter now advanced to the mid 1/3 of the right ICA, an 070 132 cm aspiration catheter with inner support catheter was advanced to the supraclinoid right ICA over an 014 inch soft tip micro guidewire with a moderate J configuration. With the micro guidewire at the proximal aspect of the occluded right MCA, the combination of the free aspiration catheter with the support catheter was then advanced without difficulty to the distal aspect of the right middle cerebral artery. The guidewire, and the inner support catheter were removed. Constant aspiration was then applied at the hub of the 070 aspiration catheter for approximately 2 minutes. Thereafter with proximal flow arrest in  the right internal carotid artery, the aspiration catheter was removed. A control arteriogram performed through the balloon guide catheter following reversal of flow arrest, demonstrated now opacification of the right M1 segment. A second pass was then made with the above combination this time with the 021 162 cm microcatheter advanced inside the aspiration catheter over an 014 inch soft tip Aristotle micro guidewire. The micro guidewire was then advanced into the distal M2 M3 region of the inferior division followed by the microcatheter. The guidewire was removed. Good aspiration was obtained from the hub of the microcatheter which was then connected to continuous heparinized saline infusion. A 3 mm x 20 mm Solitaire X retrieval device was then deployed in the usual manner such that the proximal portion of the device was just proximal to the occluded distal M1 segment. Constant aspiration was applied at the hub of the 070 aspiration catheter at the origin of the inferior division, and at the hub of the balloon guide catheter with proximal flow arrest, for 2 minutes, the combination was retrieved and removed. Bits of clot were seen in the canister, and also in the retriever. A control arteriogram performed following flow reversal now demonstrated opacification of the right M1 segment, and also the inferior division with proximal aspect of the superior division. A third pass was then made using a combination of the Vecta 46 142 cm aspiration catheter advanced with an 021 microcatheter over an 014 inch soft tip micro guidewire. The microcatheter and the wire were advanced to the M2 M3 region of the superior division. The Elam Dutch was then advanced as the combination of the micro guidewire and the microcatheter were retrieved and removed. Constant aspiration was applied for approximately a minute and a half using the Penumbra aspiration device at the hub of the Vecta to aspiration catheter. This was then retrieved and  removed with aspiration applied at the hub of the balloon guide catheter. Material was seen within the canister. A control arteriogram performed through the balloon guide catheter in the  right internal carotid artery demonstrated opacification of the superior division with the inferior division remaining patent. A TICI 2C revascularization was achieved. There continued to be slow flow noted in the perisylvian branches of the superior division, and also in the M4 region of the inferior division. The balloon guide catheter was removed. CT of the brain demonstrated evidence of early hyperattenuation in the right middle cerebral artery distribution indicative of an impending large ischemic infarct. Mild subarachnoid hyperattenuation was seen in the right perisylvian region. Manual compression was held with a quick clot for approximately 30 minutes in view of failure of an 8 French Angio-Seal closure device to deploy successfully. Distal pulses were Dopplerable in both feet unchanged. Patient was left intubated in view of her clinical condition. She was transferred to the neuro ICU for post revascularization care. IMPRESSION: Status post revascularization of occluded right middle cerebral M1 segment with 2 passes with contact aspiration, and 1 pass with contact aspiration and use of a 3 mm x 20 mm Solitaire X retrieval device achieving a TICI 2C revascularization. PLAN: Follow-up as per referring MD. Electronically Signed   By: Luanne Bras M.D.   On: 11/17/2022 09:05   VAS [place] LOWER EXTREMITY VENOUS (DVT)  Result Date: 11/16/2022  Lower Venous DVT Study Patient Name:  Melinda [name]  Date of Exam:   11/16/2022 Medical Rec #: [id number]        Accession #:    [id number] Date of Birth: 1956/01/14       Patient Gender: F Patient Age:   45 years Exam Location:  [facility] Procedure:      VAS [place] LOWER EXTREMITY VENOUS (DVT) Referring Phys: Janine Ores  --------------------------------------------------------------------------------  Indications: Stroke.  Comparison Study: no prior Performing Technologist: Archie Patten RVS  Examination Guidelines: A complete evaluation includes B-mode imaging, spectral Doppler, color Doppler, and power Doppler as needed of all accessible portions of each vessel. Bilateral testing is considered an integral part of a complete examination. Limited examinations for reoccurring indications may be performed as noted. The reflux portion of the exam is performed with the patient in reverse Trendelenburg.  +---------+---------------+---------+-----------+----------+--------------+ RIGHT    CompressibilityPhasicitySpontaneityPropertiesThrombus Aging +---------+---------------+---------+-----------+----------+--------------+ CFV      Full           Yes      Yes                                 +---------+---------------+---------+-----------+----------+--------------+ SFJ      Full                                                        +---------+---------------+---------+-----------+----------+--------------+ FV Prox  Full                                                        +---------+---------------+---------+-----------+----------+--------------+ FV Mid   Full                                                        +---------+---------------+---------+-----------+----------+--------------+  FV DistalFull                                                        +---------+---------------+---------+-----------+----------+--------------+ PFV      Full                                                        +---------+---------------+---------+-----------+----------+--------------+ POP      Full           Yes      Yes                                 +---------+---------------+---------+-----------+----------+--------------+ PTV      Full                                                         +---------+---------------+---------+-----------+----------+--------------+ PERO     Full                                                        +---------+---------------+---------+-----------+----------+--------------+   +---------+---------------+---------+-----------+----------+--------------+ LEFT     CompressibilityPhasicitySpontaneityPropertiesThrombus Aging +---------+---------------+---------+-----------+----------+--------------+ CFV      Full           Yes      Yes                                 +---------+---------------+---------+-----------+----------+--------------+ SFJ      Full                                                        +---------+---------------+---------+-----------+----------+--------------+ FV Prox  Full                                                        +---------+---------------+---------+-----------+----------+--------------+ FV Mid   Full                                                        +---------+---------------+---------+-----------+----------+--------------+ FV DistalFull                                                        +---------+---------------+---------+-----------+----------+--------------+  PFV      Full                                                        +---------+---------------+---------+-----------+----------+--------------+ POP      Full           Yes      Yes                                 +---------+---------------+---------+-----------+----------+--------------+ PTV      Full                                                        +---------+---------------+---------+-----------+----------+--------------+ PERO     Full                                                        +---------+---------------+---------+-----------+----------+--------------+     Summary: BILATERAL: - No evidence of deep vein thrombosis seen in the lower extremities, bilaterally. -No  evidence of popliteal cyst, bilaterally.   *See table(s) above for measurements and observations. Electronically signed by Orlie Pollen on 11/16/2022 at 4:40:05 PM.    Final    CT HEAD WO CONTRAST (5MM)  Result Date: 11/16/2022 CLINICAL DATA:  Stroke follow-up. EXAM: CT HEAD WITHOUT CONTRAST TECHNIQUE: Contiguous axial images were obtained from the base of the skull through the vertex without intravenous contrast. RADIATION DOSE REDUCTION: This exam was performed according to the departmental dose-optimization program which includes automated exposure control, adjustment of the mA and/or kV according to patient size and/or use of iterative reconstruction technique. COMPARISON:  Head CT 11/16/2022 at 4:05 a.m. FINDINGS: Brain: Extensive cytotoxic edema throughout the right cerebral hemisphere involving the majority of the MCA and PCA territories is similar in extent to today's earlier CT, however mass effect has increased with increased effacement of the right lateral ventricle and increased leftward midline shift, now measuring 6 mm. Persistent hyperdensity in the right basal ganglia may reflect petechial hemorrhage and/or contrast staining, and density posteriorly in the right lentiform nucleus is less prominent than on the prior CT. Small volume subarachnoid contrast or hemorrhage remains, predominantly in the right sylvian fissure. Suspected minimal residual intravascular gas in the right sylvian fissure on the prior CT has resolved. No definite new intracranial hemorrhage is identified. A small acute right cerebellar infarct is unchanged. The basilar cisterns remain patent. Vascular: Calcified atherosclerosis at the skull base. Skull: No acute fracture or suspicious osseous lesion. Sinuses/Orbits: Minimal mucosal thickening and trace fluid in the paranasal sinuses. Clear mastoid air cells. Unremarkable orbits. Other: None. IMPRESSION: 1. 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 Nonprogressive petechial hemorrhage and/or contrast staining in the right basal ganglia. 2. Unchanged small acute right cerebellar infarct. Electronically Signed   By: Logan Bores M.D.   On: 11/16/2022 14:40   CT ANGIO AO+BIFEM W &  OR WO CONTRAST  Result Date: 11/16/2022 CLINICAL DATA:  67 year old female with suspected right sided large vessel occlusive stroke status post percutaneous intervention complicated by lower extremity ischemia and possible iliac artery dissection. EXAM: CT ANGIOGRAPHY OF ABDOMINAL AORTA WITH ILIOFEMORAL RUNOFF TECHNIQUE: Multidetector CT imaging of the abdomen, pelvis and lower extremities was performed using the standard protocol during bolus administration of intravenous contrast. Multiplanar CT image reconstructions and MIPs were obtained to evaluate the vascular anatomy. RADIATION DOSE REDUCTION: This exam was performed according to the departmental dose-optimization program which includes automated exposure control, adjustment of the mA and/or kV according to patient size and/or use of iterative reconstruction technique. CONTRAST:  148m OMNIPAQUE IOHEXOL 350 MG/ML SOLN COMPARISON:  CT abdomen/pelvis 01/18/2021 FINDINGS: VASCULAR Aorta: Normal caliber aorta without aneurysm, dissection, vasculitis or significant stenosis. Mild atherosclerotic plaque. Celiac: Patent without evidence of aneurysm, dissection, vasculitis or significant stenosis. SMA: Patent without evidence of aneurysm, dissection, vasculitis or significant stenosis. Renals: Both renal arteries are patent without evidence of aneurysm, dissection, vasculitis, fibromuscular dysplasia or significant stenosis. IMA: Patent without evidence of aneurysm, dissection, vasculitis or significant stenosis. RIGHT Lower Extremity Inflow: Filling defects within the right common iliac artery results in near occlusion of the vessel. The abnormality extends for approximately 3.7  cm. Findings are consistent with acute and potentially flow limiting dissection. The external and internal iliac arteries are normal. Outflow: Common, superficial and profunda femoral arteries are patent without evidence of aneurysm, dissection, vasculitis or significant stenosis. However, flow abruptly terminates in the P2 segment of the popliteal artery and extends into the origin of the anterior tibial artery and through the peroneal trunk. The anterior and posterior tibial arteries reconstitute distally. Runoff: Patent three vessel runoff to the ankle. LEFT Lower Extremity Inflow: Common, internal and external iliac arteries are patent without evidence of aneurysm, dissection, vasculitis or significant stenosis. Outflow: Common, superficial and profunda femoral arteries and the popliteal artery are patent without evidence of aneurysm, dissection, vasculitis or significant stenosis. Runoff: Patent three vessel runoff to the ankle. Veins: No obvious venous abnormality within the limitations of this arterial phase study. Review of the MIP images confirms the above findings. NON-VASCULAR Lower chest: Small bilateral pleural effusions and associated atelectasis. Hepatobiliary: No focal liver abnormality is seen. No gallstones, gallbladder wall thickening, or biliary dilatation. Pancreas: Unremarkable. No pancreatic ductal dilatation or surrounding inflammatory changes. Spleen: No splenic injury or perisplenic hematoma. Adrenals/Urinary Tract: Adrenal glands are unremarkable. Kidneys are normal, without renal calculi, focal lesion, or hydronephrosis. Bladder is unremarkable. Stomach/Bowel: Colonic diverticular disease without CT evidence of active inflammation. No focal bowel wall thickening or evidence of obstruction. Lymphatic: Mild mistiness in the jejunal mesentery without adenopathy. Nonspecific. No suspicious lymphadenopathy. Reproductive: Status post hysterectomy. No adnexal masses. Other: No abdominal wall  hernia or abnormality. No abdominopelvic ascites. Musculoskeletal: No acute or significant osseous findings. IMPRESSION: VASCULAR 1. 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 The dissection is confined to the common iliac artery and does not involve the external or internal iliac arteries or extend into the abdominal aorta. The dissection extends for a total length of approximately 3.7 cm. 2. Abrupt occlusion of the P2 segment of the popliteal artery extending into the origin of the anterior tibial artery, through the tibioperoneal trunk and into the origin of the posterior tibial artery concerning for embolization of thrombus likely from the above described dissection. The anterior and posterior tibial arteries reconstitute distally via collateral flow. 3. Mild atherosclerotic plaque throughout the abdominal  aorta. NON-VASCULAR 1. No acute abnormality within the abdomen or pelvis. 2. Colonic diverticular disease without CT evidence of active inflammation. 3. Small bilateral pleural effusions and associated atelectasis. Electronically Signed   By: Jacqulynn Cadet M.D.   On: 11/16/2022 14:26   ECHOCARDIOGRAM COMPLETE  Result Date: 11/16/2022    ECHOCARDIOGRAM REPORT   Patient Name:   Melinda [name] Date of Exam: 11/16/2022 Medical Rec #:  [id number]       Height:       63.0 in Accession #:    [id number]      Weight:       159.4 lb Date of Birth:  Nov 16, 1955      BSA:          1.756 m Patient Age:    25 years        BP:           129/75 mmHg Patient Gender: F               HR:           91 bpm. Exam Location:  Inpatient Procedure: 2D Echo, Cardiac Doppler and Color Doppler Indications:    Stroke I63.9  History:        Patient has no prior history of Echocardiogram examinations.                 Risk Factors:Dyslipidemia and Current Smoker.  Sonographer:    Greer Pickerel Referring Phys: [id number] [facility]  Sonographer Comments: Echo performed with patient supine and on artificial  respirator. Image acquisition challenging due to patient body habitus and Image acquisition challenging due to respiratory motion. IMPRESSIONS  1. Left ventricular ejection fraction, by estimation, is 65 to 70%. The left ventricle has normal function. The left ventricle has no regional wall motion abnormalities. Indeterminate diastolic filling due to E-A fusion.  2. Right ventricular systolic function is normal. The right ventricular size is normal. There is normal pulmonary artery systolic pressure. The estimated right ventricular systolic pressure is 35.5 mmHg.  3. Left atrial size was moderately dilated.  4. Trivial mitral valve regurgitation.  5. Aortic valve regurgitation is not visualized.  6. The inferior vena cava is dilated in size with >50% respiratory variability, suggesting right atrial pressure of 8 mmHg. Conclusion(s)/Recommendation(s): Normal biventricular function without evidence of hemodynamically significant valvular heart disease. FINDINGS  Left Ventricle: Left ventricular ejection fraction, by estimation, is 65 to 70%. The left ventricle has normal function. The left ventricle has no regional wall motion abnormalities. The left ventricular internal cavity size was normal in size. There is  no left ventricular hypertrophy. Indeterminate diastolic filling due to E-A fusion. Right Ventricle: The right ventricular size is normal. Right ventricular systolic function is normal. There is normal pulmonary artery systolic pressure. The tricuspid regurgitant velocity is 1.18 m/s, and with an assumed right atrial pressure of 8 mmHg,  the estimated right ventricular systolic pressure is 73.2 mmHg. Left Atrium: Left atrial size was moderately dilated. Right Atrium: Right atrial size was normal in size. Pericardium: There is no evidence of pericardial effusion. Mitral Valve: Mild mitral annular calcification. Trivial mitral valve regurgitation. Tricuspid Valve: Tricuspid valve regurgitation is trivial. Aortic  Valve: Aortic valve regurgitation is not visualized. Pulmonic Valve: Pulmonic valve regurgitation is not visualized. Aorta: The aortic root and ascending aorta are structurally normal, with no evidence of dilitation. Venous: The inferior vena cava is dilated in size with greater than 50% respiratory variability, suggesting right atrial pressure of  8 mmHg. IAS/Shunts: The interatrial septum was not well visualized.  LEFT VENTRICLE PLAX 2D LVIDd:         4.20 cm   Diastology LVIDs:         2.60 cm   LV e' medial:    11.50 cm/s LV PW:         1.00 cm   LV E/e' medial:  12.2 LV IVS:        0.80 cm   LV e' lateral:   8.86 cm/s LVOT diam:     1.90 cm   LV E/e' lateral: 15.8 LV SV:         86 LV SV Index:   49 LVOT Area:     2.84 cm  RIGHT VENTRICLE RV S prime:     21.90 cm/s TAPSE (M-mode): 2.8 cm LEFT ATRIUM              Index        RIGHT ATRIUM           Index LA diam:        3.90 cm  2.22 cm/m   RA Area:     15.80 cm LA Vol (A2C):   105.0 ml 59.80 ml/m  RA Volume:   37.10 ml  21.13 ml/m LA Vol (A4C):   81.9 ml  46.64 ml/m LA Biplane Vol: 94.4 ml  53.76 ml/m  AORTIC VALVE LVOT Vmax:   137.00 cm/s LVOT Vmean:  86.300 cm/s LVOT VTI:    0.302 m  AORTA Ao Root diam: 3.20 cm Ao Asc diam:  3.60 cm MITRAL VALVE                TRICUSPID VALVE MV Area (PHT): 4.80 cm     TR Peak grad:   5.6 mmHg MV Decel Time: 158 msec     TR Vmax:        118.00 cm/s MV E velocity: 140.00 cm/s MV A velocity: 133.00 cm/s  SHUNTS MV E/A ratio:  1.05         Systemic VTI:  0.30 m                             Systemic Diam: 1.90 cm Phineas Inches Electronically signed by Phineas Inches Signature Date/Time: 11/16/2022/11:21:21 AM    Final    CT HEAD WO CONTRAST (5MM)  Result Date: 11/16/2022 CLINICAL DATA:  Stroke suspected EXAM: CT HEAD WITHOUT CONTRAST TECHNIQUE: Contiguous axial images were obtained from the base of the skull through the vertex without intravenous contrast. RADIATION DOSE REDUCTION: This exam was performed according to the  departmental dose-optimization program which includes automated exposure control, adjustment of the mA and/or kV according to patient size and/or use of iterative reconstruction technique. COMPARISON:  Flat panel CT from earlier the same day. Head CT from yesterday. FINDINGS: Brain: Large established infarct in the right MCA and PCA cortex. Smaller acute infarct in the right cerebellum. Subarachnoid contrast and gas (which is likely intravascular) has diminished. Mild contrast staining or petechial hemorrhage at the right basal ganglia. Vascular: Not well assessed after recent contrast Skull: Normal. Negative for fracture or focal lesion. Sinuses/Orbits: No acute finding. IMPRESSION: 1. Established acute infarct involving most of the right MCA/PCA cortex. Smaller acute infarct in the right cerebellum. 2. Petechial hemorrhage or contrast staining at the right basal ganglia; subarachnoid contrast and intravascular gas is diminished from procedural CT. Electronically Signed   By:  Jorje Guild M.D.   On: 11/16/2022 04:16   DG Abd Portable 1V  Result Date: 11/16/2022 CLINICAL DATA:  NG tube placement EXAM: PORTABLE ABDOMEN - 1 VIEW COMPARISON:  05/26/2008 FINDINGS: NG tube tip is in the stomach with the side port near the GE junction. IMPRESSION: NG tube in the stomach. Electronically Signed   By: Rolm Baptise M.D.   On: 11/16/2022 03:17   DG CHEST PORT 1 VIEW  Result Date: 11/16/2022 CLINICAL DATA:  ET tube, NG tube EXAM: PORTABLE CHEST 1 VIEW COMPARISON:  11/04/2022 FINDINGS: Endotracheal tube is 3.4 cm above the carina. NG tube is in the stomach. Severe diffuse bilateral airspace disease with slight improvement since prior study. Heart is normal size. No effusions or acute bony abnormality. IMPRESSION: Support devices as above. Severe diffuse bilateral airspace disease, slightly improved. Electronically Signed   By: Rolm Baptise M.D.   On: 11/16/2022 03:16   CT HEAD WO CONTRAST (5MM)  Result Date:  11/25/2022 CLINICAL DATA:  Stroke suspected EXAM: CT HEAD WITHOUT CONTRAST CT ANGIOGRAPHY HEAD AND NECK CT PERFUSION TECHNIQUE: Contiguous axial images were obtained from the base of the skull through the vertex without intravenous contrast. Multidetector CT imaging of the head and neck was performed using the standard protocol during bolus administration of intravenous contrast. Multiplanar CT image reconstructions and MIPs were obtained to evaluate the vascular anatomy. Carotid stenosis measurements (when applicable) are obtained utilizing NASCET criteria, using the distal internal carotid diameter as the denominator. Multiphase CT imaging of the brain was performed following IV bolus contrast injection. Subsequent parametric perfusion maps were calculated using RAPID software. RADIATION DOSE REDUCTION: This exam was performed according to the departmental dose-optimization program which includes automated exposure control, adjustment of the mA and/or kV according to patient size and/or use of iterative reconstruction technique. CONTRAST:  137m OMNIPAQUE IOHEXOL 350 MG/ML SOLN COMPARISON:  CT head 09/13/2017, no prior CTA FINDINGS: CT HEAD FINDINGS Brain: Hypodensity in the right anterior temporal lobe, including the insula (series 2, images 12-13). There is also possible loss of gray-white differentiation in the right occipital lobe (series 2, image 16). It no evidence of acute hemorrhage, mass, mass effect, or midline shift. No hydrocephalus or extra-axial collection. Vascular: Suspect a hyperdense right MCA (series 7, images 25-26). Skull: Negative for fracture or focal lesion. Sinuses/Orbits: No acute finding. Other: The mastoid air cells are well aerated. ASPECTS ([facility]) - Ganglionic level infarction (caudate, lentiform nuclei, internal capsule, insula, M1-M3 cortex): 5 - Supraganglionic infarction (M4-M6 cortex): 3 Total score (0-10 with 10 being normal): 8 CTA NECK FINDINGS  Evaluation is significantly limited by motion. Aortic arch: Standard branching. Imaged portion shows no evidence of aneurysm or dissection. No significant stenosis of the major arch vessel origins. Right carotid system: Grossly patent, however the distal right ICA is not well seen due to motion. Left carotid system: Grossly patent, however the distal left ICAs not well seen due to motion. Vertebral arteries: Patent V1 and V2 segments. The V3 segments are not well evaluated due to motion. Skeleton: No acute osseous abnormality.  Status post ACDF C5-C7 Other neck: Motion limited but no acute finding. Upper chest: Motion limited but diffuse nodular and ground-glass opacities. Review of the MIP images confirms the above findings CTA HEAD FINDINGS Evaluation is significantly limited by motion. Anterior circulation: Both internal carotid arteries are patent to the termini, without significant stenosis. A1 segments patent. Normal anterior communicating artery. Anterior cerebral arteries are patent proximally,  with limited evaluation of the distal vessels. Occlusion of the proximal right M1 (series 8, image 273), with minimal opacification of the remainder of the right MCA territory. No left M1 stenosis or occlusion. Left MCA branches are grossly perfused. Posterior circulation: The vertebral arteries cannot be evaluated due to motion. Basilar patent to its distal aspect. Superior cerebellar arteries or not well seen. Patent P1 segments. Near fetal origin of the right PCA for prominent right posterior communicating artery. The right PCA is not seeing past the proximal P2 segment (series 8, image 266-270). The left PCA is poorly visualized due to motion but appears patent proximally. Venous sinuses: As permitted by contrast timing, patent. Anatomic variants: Near fetal origin of the right PCA. Review of the MIP images confirms the above findings CT BRAIN PERFUSION FINDINGS: ASPECTS: 8 CBF (<30%) Volume: 153m Perfusion  (Tmax>6.0s) volume: 1883mMismatch Volume: 4247mnfarction Location:Right MCA territory. IMPRESSION: 1. The CTA is significantly limited by motion artifact within this limitation, there is hypodensity in the right anterior temporal lobe on the noncontrast CT head, including the insula, concerning for acute infarct, with right M1 occlusion on CTA and CT perfusion consistent with right MCA territory infarct, with 147 mL infarct core and 42 mL mismatch volume. 2. Possible loss of gray-white differentiation in the right occipital lobe, with likely occlusion of the right P2 segment. 3. Evaluation of the distal ICAs, vertebral arteries, superior cerebellar arteries, and left PCA is significantly limited by motion. This study is essentially nondiagnostic for additional stenosis. 4. Motion limited evaluation of the lungs, with diffuse nodular and ground-glass opacities, which could be infectious or inflammatory. Code stroke imaging results involving the R MCA were communicated on 10/31/2022 at 8:32 pm to provider WenSherri Rada telephone, who verbally acknowledged these results. Additional findings were also discussed by telephone on 11/22/2022 at 8:56 pm with provider ABIGAIL CHAVEZ . Electronically Signed   By: AliMerilyn BabaD.   On: 11/16/2022 21:04   CT ANGIO HEAD NECK W WO CM W PERF (CODE STROKE)  Result Date: 11/17/2022 CLINICAL DATA:  Stroke suspected EXAM: CT HEAD WITHOUT CONTRAST CT ANGIOGRAPHY HEAD AND NECK CT PERFUSION TECHNIQUE: Contiguous axial images were obtained from the base of the skull through the vertex without intravenous contrast. Multidetector CT imaging of the head and neck was performed using the standard protocol during bolus administration of intravenous contrast. Multiplanar CT image reconstructions and MIPs were obtained to evaluate the vascular anatomy. Carotid stenosis measurements (when applicable) are obtained utilizing NASCET criteria, using the distal internal carotid diameter as the  denominator. Multiphase CT imaging of the brain was performed following IV bolus contrast injection. Subsequent parametric perfusion maps were calculated using RAPID software. RADIATION DOSE REDUCTION: This exam was performed according to the departmental dose-optimization program which includes automated exposure control, adjustment of the mA and/or kV according to patient size and/or use of iterative reconstruction technique. CONTRAST:  100m75mNIPAQUE IOHEXOL 350 MG/ML SOLN COMPARISON:  CT head 09/13/2017, no prior CTA FINDINGS: CT HEAD FINDINGS Brain: Hypodensity in the right anterior temporal lobe, including the insula (series 2, images 12-13). There is also possible loss of gray-white differentiation in the right occipital lobe (series 2, image 16). It no evidence of acute hemorrhage, mass, mass effect, or midline shift. No hydrocephalus or extra-axial collection. Vascular: Suspect a hyperdense right MCA (series 7, images 25-26). Skull: Negative for fracture or focal lesion. Sinuses/Orbits: No acute finding. Other: The mastoid air cells are well aerated. ASPECTS ([facility]  Early CT Score) - Ganglionic level infarction (caudate, lentiform nuclei, internal capsule, insula, M1-M3 cortex): 5 - Supraganglionic infarction (M4-M6 cortex): 3 Total score (0-10 with 10 being normal): 8 CTA NECK FINDINGS Evaluation is significantly limited by motion. Aortic arch: Standard branching. Imaged portion shows no evidence of aneurysm or dissection. No significant stenosis of the major arch vessel origins. Right carotid system: Grossly patent, however the distal right ICA is not well seen due to motion. Left carotid system: Grossly patent, however the distal left ICAs not well seen due to motion. Vertebral arteries: Patent V1 and V2 segments. The V3 segments are not well evaluated due to motion. Skeleton: No acute osseous abnormality.  Status post ACDF C5-C7 Other neck: Motion limited but no acute finding. Upper  chest: Motion limited but diffuse nodular and ground-glass opacities. Review of the MIP images confirms the above findings CTA HEAD FINDINGS Evaluation is significantly limited by motion. Anterior circulation: Both internal carotid arteries are patent to the termini, without significant stenosis. A1 segments patent. Normal anterior communicating artery. Anterior cerebral arteries are patent proximally, with limited evaluation of the distal vessels. Occlusion of the proximal right M1 (series 8, image 273), with minimal opacification of the remainder of the right MCA territory. No left M1 stenosis or occlusion. Left MCA branches are grossly perfused. Posterior circulation: The vertebral arteries cannot be evaluated due to motion. Basilar patent to its distal aspect. Superior cerebellar arteries or not well seen. Patent P1 segments. Near fetal origin of the right PCA for prominent right posterior communicating artery. The right PCA is not seeing past the proximal P2 segment (series 8, image 266-270). The left PCA is poorly visualized due to motion but appears patent proximally. Venous sinuses: As permitted by contrast timing, patent. Anatomic variants: Near fetal origin of the right PCA. Review of the MIP images confirms the above findings CT BRAIN PERFUSION FINDINGS: ASPECTS: 8 CBF (<30%) Volume: 159m Perfusion (Tmax>6.0s) volume: 184mMismatch Volume: [id number] Location:Right MCA territory. IMPRESSION: 1. The CTA is significantly limited by motion artifact within this limitation, there is hypodensity in the right anterior temporal lobe on the noncontrast CT head, including the insula, concerning for acute infarct, with right M1 occlusion on CTA and CT perfusion consistent with right MCA territory infarct, with 147 mL infarct core and 42 mL mismatch volume. 2. Possible loss of gray-white differentiation in the right occipital lobe, with likely occlusion of the right P2 segment. 3. Evaluation of the distal  ICAs, vertebral arteries, superior cerebellar arteries, and left PCA is significantly limited by motion. This study is essentially nondiagnostic for additional stenosis. 4. Motion limited evaluation of the lungs, with diffuse nodular and ground-glass opacities, which could be infectious or inflammatory. Code stroke imaging results involving the R MCA were communicated on 11/01/2022 at 8:32 pm to provider WenSherri Rada telephone, who verbally acknowledged these results. Additional findings were also discussed by telephone on 11/22/2022 at 8:56 pm with provider ABIGAIL CHAVEZ . Electronically Signed   By: AliMerilyn BabaD.   On: 11/02/2022 21:04   DG Chest Port 1 View  Result Date: 11/29/2022 CLINICAL DATA:  Acute respiratory distress. EXAM: PORTABLE CHEST 1 VIEW COMPARISON:  11/26/2022. FINDINGS: The heart size and mediastinal contours are stable. There is marked interval worsening of diffuse airspace opacities in the lungs bilaterally. No effusion or pneumothorax. No acute osseous abnormality. IMPRESSION: Marked interval worsening of diffuse airspace opacities in the lungs bilaterally, possible edema or infiltrate. Electronically Signed   By: LauMickel Baas  Lovena Le M.D.   On: 11/06/2022 20:55   DG Chest Portable 1 View  Result Date: 11/20/2022 CLINICAL DATA:  Flu.  Hypoxia.  Coughing and rib pain. EXAM: PORTABLE CHEST 1 VIEW COMPARISON:  11/09/2022 FINDINGS: Stable cardiomediastinal contours. Marked central airway thickening. No pleural effusion. Interval development multifocal, bilateral airspace and interstitial opacities. IMPRESSION: Interval development of multifocal bilateral airspace and interstitial opacities compatible with multifocal infection. Marked central airway thickening. Electronically Signed   By: Kerby Moors M.D.   On: 11/21/2022 06:42   DG Tibia/Fibula Right  Result Date: 11/09/2022 CLINICAL DATA:  67 year old female with fall, pain. EXAM: RIGHT TIBIA AND FIBULA - 2 VIEW COMPARISON:  Right  tib fib series 08/19/2017. FINDINGS: Bone mineralization is within normal limits. Maintained alignment at the right knee and ankle. Calcaneus degenerative spurring. No knee or ankle joint effusion is evident. No acute osseous abnormality identified. No discrete soft tissue injury. IMPRESSION: No acute fracture or dislocation identified about the right tib fib. Electronically Signed   By: Genevie Ann M.D.   On: 11/09/2022 09:15   DG Chest 2 View  Result Date: 11/09/2022 CLINICAL DATA:  Fall.  Cough. EXAM: CHEST - 2 VIEW COMPARISON:  CXR 08/19/17 FINDINGS: No pleural effusion. No pneumothorax. Normal cardiac and mediastinal contours. Partially visualized cervical spinal fusion hardware in place. Compared to prior exam there is interval increase in prominence of bilateral interstitial opacities, which can be seen in the setting of atypical infection. Vertebral body heights are maintained. No displaced rib fractures. IMPRESSION: 1. Interval increase in prominence of bilateral interstitial opacities, which can be seen in the setting of atypical infection. 2. No displaced rib fractures. No pneumothorax. Electronically Signed   By: Marin Roberts M.D.   On: 11/09/2022 08:08    Microbiology Recent Results (from the past 240 hour(s))  MRSA Next Gen by PCR, Nasal     Status: None   Collection Time: 11/07/2022  9:19 PM   Specimen: Nasal Mucosa; Nasal Swab  Result Value Ref Range Status   MRSA by PCR Next Gen NOT DETECTED NOT DETECTED Final    Comment: (NOTE) The GeneXpert MRSA Assay (FDA approved for NASAL specimens only), is one component of a comprehensive MRSA colonization surveillance program. It is not intended to diagnose MRSA infection nor to guide or monitor treatment for MRSA infections. Test performance is not FDA approved in patients less than 64 years old. Performed at [facility], [place] [address], [place], [place] [address]   Culture, Respiratory w Gram Stain     Status: None    Collection Time: 12-05-2022  4:41 AM   Specimen: Tracheal Aspirate; Respiratory  Result Value Ref Range Status   Specimen Description TRACHEAL ASPIRATE  Final   Special Requests NONE  Final   Gram Stain   Final    FEW WBC PRESENT,BOTH PMN AND MONONUCLEAR NO ORGANISMS SEEN Performed at [facility], [address], [place], [place] [address]    Culture RARE CANDIDA TROPICALIS RARE CANDIDA ALBICANS   Final   Report Status 11/20/2022 FINAL  Final  Culture, blood (Routine X 2) w Reflex to ID Panel     Status: None   Collection Time: 12/05/22  5:55 AM   Specimen: BLOOD LEFT ARM  Result Value Ref Range Status   Specimen Description BLOOD LEFT ARM  Final   Special Requests   Final    BOTTLES DRAWN AEROBIC ONLY Blood Culture adequate volume   Culture   Final  NO GROWTH 5 DAYS Performed at [facility], [place] [address], [place], [place] [address]    Report Status 11/23/2022 FINAL  Final  Culture, blood (Routine X 2) w Reflex to ID Panel     Status: None   Collection Time: 2022/11/23  8:31 AM   Specimen: BLOOD  Result Value Ref Range Status   Specimen Description BLOOD SITE NOT SPECIFIED  Final   Special Requests   Final    BOTTLES DRAWN AEROBIC AND ANAEROBIC Blood Culture adequate volume   Culture   Final    NO GROWTH 5 DAYS Performed at [facility], [place] [address] [place], [place] [address]    Report Status 11/23/2022 FINAL  Final  Resp panel by RT-PCR (RSV, Flu A&B, Covid) Tracheal Aspirate     Status: None   Collection Time: 2022/11/23  8:47 AM   Specimen: Tracheal Aspirate; Nasal Swab  Result Value Ref Range Status   SARS Coronavirus 2 by RT PCR NEGATIVE NEGATIVE Final    Comment: (NOTE) SARS-CoV-2 target nucleic acids are NOT DETECTED.  The SARS-CoV-2 RNA is generally detectable in upper respiratory specimens during the acute phase of infection. The lowest concentration of SARS-CoV-2 viral copies this assay can detect is 138 copies/mL. A negative result  does not preclude SARS-Cov-2 infection and should not be used as the sole basis for treatment or other patient management decisions. A negative result may occur with  improper specimen collection/handling, submission of specimen other than nasopharyngeal swab, presence of viral mutation(s) within the areas targeted by this assay, and inadequate number of viral copies(<138 copies/mL). A negative result must be combined with clinical observations, patient history, and epidemiological information. The expected result is Negative.  Fact Sheet for Patients:  EntrepreneurPulse.com.au  Fact Sheet for Healthcare Providers:  IncredibleEmployment.be  This test is no t yet approved or cleared by the [place] FDA and  has been authorized for detection and/or diagnosis of SARS-CoV-2 by FDA under an Emergency Use Authorization (EUA). This EUA will remain  in effect (meaning this test can be used) for the duration of the COVID-19 declaration under Section 564(b)(1) of the Act, 21 U.S.C.section 360bbb-3(b)(1), unless the authorization is terminated  or revoked sooner.       Influenza A by PCR NEGATIVE NEGATIVE Final   Influenza B by PCR NEGATIVE NEGATIVE Final    Comment: (NOTE) The Xpert Xpress SARS-CoV-2/FLU/RSV plus assay is intended as an aid in the diagnosis of influenza from Nasopharyngeal swab specimens and should not be used as a sole basis for treatment. Nasal washings and aspirates are unacceptable for Xpert Xpress SARS-CoV-2/FLU/RSV testing.  Fact Sheet for Patients: EntrepreneurPulse.com.au  Fact Sheet for Healthcare Providers: IncredibleEmployment.be  This test is not yet approved or cleared by the [place] FDA and has been authorized for detection and/or diagnosis of SARS-CoV-2 by FDA under an Emergency Use Authorization (EUA). This EUA will remain in effect (meaning this test can be used) for  the duration of the COVID-19 declaration under Section 564(b)(1) of the Act, 21 U.S.C. section 360bbb-3(b)(1), unless the authorization is terminated or revoked.     Resp Syncytial Virus by PCR NEGATIVE NEGATIVE Final    Comment: (NOTE) Fact Sheet for Patients: EntrepreneurPulse.com.au  Fact Sheet for Healthcare Providers: IncredibleEmployment.be  This test is not yet approved or cleared by the [place] FDA and has been authorized for detection and/or diagnosis of SARS-CoV-2 by FDA under an Emergency Use Authorization (EUA). This EUA will remain in  effect (meaning this test can be used) for the duration of the COVID-19 declaration under Section 564(b)(1) of the Act, 21 U.S.C. section 360bbb-3(b)(1), unless the authorization is terminated or revoked.  Performed at [facility], [place] [address] [place], [place] [address]   Culture, Respiratory w Gram Stain     Status: None   Collection Time: 2022/11/24  9:23 AM   Specimen: Bronchoalveolar Lavage; Respiratory  Result Value Ref Range Status   Specimen Description BRONCHIAL ALVEOLAR LAVAGE  Final   Special Requests LEFT UPPER  Final   Gram Stain   Final    FEW WBC PRESENT, PREDOMINANTLY PMN NO ORGANISMS SEEN Performed at [facility], [address] [place], [place] [address]    Culture FEW ENTEROCOCCUS FAECALIS  Final   Report Status 11/20/2022 FINAL  Final   Organism ID, Bacteria ENTEROCOCCUS FAECALIS  Final      Susceptibility   Enterococcus faecalis - MIC*    AMPICILLIN <=2 SENSITIVE Sensitive     VANCOMYCIN 1 SENSITIVE Sensitive     GENTAMICIN SYNERGY SENSITIVE Sensitive     * FEW ENTEROCOCCUS FAECALIS  Culture, Respiratory w Gram Stain     Status: None   Collection Time: November 24, 2022  9:23 AM   Specimen: Bronchoalveolar Lavage; Respiratory  Result Value Ref Range Status   Specimen Description BRONCHIAL ALVEOLAR LAVAGE  Final   Special Requests RIGHT MIDDLE  Final    Gram Stain   Final    RARE WBC PRESENT, PREDOMINANTLY PMN NO ORGANISMS SEEN Performed at [facility], [address] [place], [place] [address]    Culture   Final    RARE ENTEROCOCCUS FAECALIS RARE CANDIDA TROPICALIS    Report Status 11/21/2022 FINAL  Final   Organism ID, Bacteria ENTEROCOCCUS FAECALIS  Final      Susceptibility   Enterococcus faecalis - MIC*    AMPICILLIN <=2 SENSITIVE Sensitive     VANCOMYCIN 1 SENSITIVE Sensitive     GENTAMICIN SYNERGY SENSITIVE Sensitive     * RARE ENTEROCOCCUS FAECALIS  Urine Culture     Status: None   Collection Time: 11/24/22  1:14 PM   Specimen: Urine, Catheterized  Result Value Ref Range Status   Specimen Description URINE, CATHETERIZED  Final   Special Requests DONOR PT  Final   Culture   Final    NO GROWTH Performed at [facility], [place] [address], [place], [place] [address]    Report Status 11/19/2022 FINAL  Final    Lab Basic Metabolic Panel: Recent Labs  Lab 11/17/22 1433 24-Nov-2022 0429 2022/11/24 0530 11-24-2022 0607 24-Nov-2022 1136  NA 157* 157* 158* 156* 156*  K  --  4.3 4.2  --  4.0  CL  --   --  115*  --  113*  CO2  --   --  34*  --  33*  GLUCOSE  --   --  110*  --  119*  BUN  --   --  18  --  20  CREATININE  --   --  0.75  --  0.68  CALCIUM  --   --  7.9*  --  7.9*  MG  --   --  2.6*  --   --   PHOS  --   --  3.0  --   --    Liver Function Tests: Recent Labs  Lab 11-24-22 0530 Nov 24, 2022 1136  AST 50* 57*  ALT 40 41  ALKPHOS  69 67  BILITOT 0.4 0.5  PROT 5.3* 5.4*  ALBUMIN 1.8* 1.7*   Recent Labs  Lab 11/24/2022 0530  LIPASE 22  AMYLASE 32   No results for input(s): "AMMONIA" in the last 168 hours. CBC: Recent Labs  Lab Nov 24, 2022 0429 2022-11-24 0530 2022/11/24 1136 November 24, 2022 1722  WBC  --  11.6* 11.9* 13.5*  NEUTROABS  --  9.7* 10.1* 11.4*  HGB 9.2* 9.5* 9.3* 9.7*  HCT 27.0* 29.0* 29.9* 30.9*  MCV  --  96.3 99.0 98.7  PLT  --  153 144* 169   Cardiac Enzymes: Recent Labs  Lab  Nov 24, 2022 0530  CKTOTAL 1,141*  CKMB 19.4*   Sepsis Labs: Recent Labs  Lab 2022-11-24 0530 11-24-2022 0603 11/24/2022 1136 11/24/2022 1722  WBC 11.6*  --  11.9* 13.5*  LATICACIDVEN  --  1.9  --   --     Procedures/Operations  See epic for details   Maryjane Hurter 11/24/2022, 12:24 PM

## 2022-11-30 NOTE — Progress Notes (Incomplete)
Following a meeting earlier this evening with Dr. Lorrin Goodell and the decision for comfort care measures family was informed by [facility] personnel that the pt is a 1st person organ donor. After much discussion family chose to honor the pt wishes and have her go through the process for organ donation. No

## 2022-11-30 NOTE — Progress Notes (Signed)
NAME:  Melinda [name], MRN:  [id number], DOB:  1955-11-03, LOS: 4 ADMISSION DATE:  11/11/2022, CONSULTATION DATE:  11/16/2022 REFERRING MD:  Estanislado Pandy, CHIEF COMPLAINT: Left-sided weakness  History of Present Illness:  67 y/o female admitted on November 13, 2022 in the context of cough, vomiting.  Found to be flu a positive.  Admitted by the hospitalist, treated with 2 L nasal cannula, Tamiflu.  On January 17 patient was noted to have new onset left-sided weakness.  During the day on January 17 she ambulated with assistance in the hallway and required 5 L nasal cannula oxygen.  This is around 1:30 in the afternoon.  Code stroke was called at 8 PM when she was noted to have left-sided weakness and confusion.  She had been last seen normal at 530pm.  Telemetry neuro was consulted (the patient was admitted to [facility]) and further head imaging was ordered including a CT scan, CT angiogram, and CT perfusion study.  This showed a right M1 and P2 occlusion of right MCA, area of infarct in right MCA territory noted.  Study was noted to be motion degraded.  Interventional radiology was consulted and she was transferred from [facility] to [facility] where she underwent a thrombectomy the evening of January 17.  The patient was intubated and sedated for the procedure, pulmonary and critical care medicine was consulted for ventilator management.   Pertinent  Medical History   Past Medical History:  Diagnosis Date   Reflux    Sprain of neck 11/24/2013   Significant Hospital Events: Including procedures, antibiotic start and stop dates in addition to other pertinent events   1/17 Status post revascularization of the right middle cerebral artery distribution with 2 passes with complex aspiration, and 1 pass with contact aspiration- achieving aTICI 2C revascularization.  1/18  Head CT - Evolving large acute right MCA and PCA infarcts with mildly increased mass effect including 6 mm of  leftward midline shift. Nonprogressive petechial hemorrhage and/or contrast staining in the right basal ganglia. Unchanged small acute right cerebellar infarct. 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  Right lower extremity becoming more cyanotic  Interim History / Subjective:  Pupil change, R uncal herniation  Decision made to pursue comfort measures only  Pt is organ donor, plan for DCD  Objective   Blood pressure (!) 141/65, pulse 65, temperature (!) 101.8 F (38.8 C), resp. rate (!) 27, height 5' 3"  (1.6 m), weight 76.4 kg, SpO2 100 %.    Vent Mode: PRVC FiO2 (%):  [50 %-80 %] 70 % Set Rate:  [30 bmp] 30 bmp Vt Set:  [420 mL] 420 mL PEEP:  [10 cmH20] 10 cmH20 Plateau Pressure:  [22 cmH20-26 cmH20] 22 cmH20   Intake/Output Summary (Last 24 hours) at 11-30-22 0752 Last data filed at Nov 30, 2022 0700 Gross per 24 hour  Intake 2790.47 ml  Output 1550 ml  Net 1240.47 ml   Filed Weights   11/20/2022 0508 11/22/2022 2106 11/17/22 0500  Weight: 70.4 kg 72.3 kg 76.4 kg    Examination: General appearance: 67 y.o., female, intubated Eyes: R pupil dilated HENT: ETT Lungs: mech breath sounds bl, equal chest rise CV: RRR, no murmur  Abdomen: Soft, non-tender; non-distended, BS present  Extremities: RLE cool and bluish discoloration Neuro: does not follow commands  Na 156 WBC 11.6 Hb 9.5  CTH with  R uncal herniation  Resolved Hospital Problem list     Assessment & Plan:  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 Acute hypoxemic respiratory failure secondary to influenza A pneumonia complicated by recent stroke Concern for ARDS Ischemic right lower extremity in the setting of 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  Critical care was necessary to treat or prevent imminent or life-threatening deterioration.  Critical care was time spent personally by me on the following activities: development of treatment plan with patient and/or surrogate as well as nursing, discussions with consultants, evaluation of patient's response to treatment, examination of patient, obtaining history from patient or surrogate, ordering and performing treatments and interventions, ordering and review of laboratory studies, ordering and review of radiographic studies, pulse oximetry and re-evaluation of patient's condition.  [place]  Personal contact information can be found on Amion  If no contact or response made please call [phone] 12-04-22, 7:52 AM

## 2022-11-30 NOTE — Procedures (Signed)
Bronchoscopy Procedure Note  Melinda [name]  [id number]  Apr 02, 1956  Date:November 29, 2022  Time:10:04 AM   Provider Performing:Melinda [name] Melinda [name]   Procedure(s):  Flexible bronchoscopy with bronchial alveolar lavage [phone])  Indication(s) Acute hypoxic respiratory failure ARDS Organ donor  Consent Risks of the procedure as well as the alternatives and risks of each were explained to the patient and/or caregiver.  Consent for the procedure was obtained and is signed in the bedside chart  Anesthesia See MAR for details   Time Out Verified patient identification, verified procedure, site/side was marked, verified correct patient position, special equipment/implants available, medications/allergies/relevant history reviewed, required imaging and test results available.   Sterile Technique Usual hand hygiene, masks, gowns, and gloves were used   Procedure Description Bronchoscope advanced through endotracheal tube and into airway.  Airways were examined down to subsegmental level with findings noted below.   Following diagnostic evaluation, BALs performed in lingula, apical LUL, RML  Findings:  - edematous friable mucosa throughout - some purulent secretions LLL which were suctioned   Complications/Tolerance None; patient tolerated the procedure well. Chest X-ray is not needed post procedure.   EBL Minimal   Specimen(s) BALs sent for culture

## 2022-11-30 NOTE — Progress Notes (Signed)
Two RT's attempted to place aline, both unable to thread catheter. RN informed.

## 2022-11-30 NOTE — Progress Notes (Signed)
Pt transported from 4N16 to O.R. room 12 for organ donation with no complications. Pt was extubated at 2218 per [facility] rep's request.

## 2022-11-30 NOTE — Progress Notes (Signed)
Pt transported to and from CT scan on the ventilator without incident. 

## 2022-11-30 NOTE — Progress Notes (Signed)
   11/08/2022 1000  Spiritual Encounters  Type of Visit Follow up;Attempt (pt unavailable)  Conversation partners present during encounter Nurse  Referral source [place] team  Reason for visit End-of-life  OnCall Visit No  [place] Issues Still Outstanding Referring to oncoming chaplain for further support  Follow up plan  Return for [place] if possible   [facility] [place] attempted follow-up visit per referral from [place]; at time of visit no family/support persons present; RN shared pt. is going to be an organ donor and an [employer] is scheduled for this evening at 2100.  [place] will make referral to oncoming chaplain for potential follow up.  Staff is aware of chaplains' availability if further support is needed.

## 2022-11-30 NOTE — Progress Notes (Signed)
Supportive care provided to this large family presence as final " honor walk" occurred at 9:20 p.m.. Hospital staff, [place] all shared in supporting family in a wonderful way.

## 2022-11-30 NOTE — Procedures (Signed)
Arterial Catheter Insertion Procedure Note  Melinda [name]  [id number]  February 07, 1956  Date:11/12/2022  Time:10:07 AM    Provider Performing: Maryjane Hurter    Procedure: Insertion of Arterial Line [phone]) with US guidance ([id number])   Indication(s) Blood pressure monitoring and/or need for frequent ABGs  Consent Risks of the procedure as well as the alternatives and risks of each were explained to the patient and/or caregiver.  Consent for the procedure was obtained and is signed in the bedside chart  Anesthesia 1% lidocaine   Time Out Verified patient identification, verified procedure, site/side was marked, verified correct patient position, special equipment/implants available, medications/allergies/relevant history reviewed, required imaging and test results available.   Sterile Technique Maximal sterile technique including full sterile barrier drape, hand hygiene, sterile gown, sterile gloves, mask, hair covering, sterile ultrasound probe cover (if used).   Procedure Description Area of catheter insertion was cleaned with chlorhexidine and draped in sterile fashion. With real-time ultrasound guidance an arterial catheter was placed into the left  axillary  artery.  Appropriate arterial tracings confirmed on monitor.       Complications/Tolerance None; patient tolerated the procedure well.   EBL Minimal   Specimen(s) None

## 2022-11-30 NOTE — Plan of Care (Signed)
Patient has developed right-sided uncal herniation overnight.  Patient's family has opted for comfort care with donation of organs after circulatory death this evening.  CCM team to take over care of patient, but stroke team will be available if any questions or concerns arise.

## 2022-11-30 NOTE — Progress Notes (Signed)
Pt extubated by RRT at Feb 23, 2217 for organ procurement. Pt displaying asystole rhythm on the monitor starting at 2232-02-24.  Chest auscultated for one minute by both Jerelene Redden, RN and Chrissie Noa, RN.  Both nurses in agreement that no heart sounds were heard during auscultation.  Time of death pronounced at 02/23/37.

## 2022-11-30 NOTE — Progress Notes (Signed)
OT Cancellation Note  Patient Details Name: Melinda [name] MRN: [id number] DOB: 1956-04-14   Cancelled Treatment:    Reason Eval/Treat Not Completed: Other (comment) (Pt is comfort care. OT will sign off.)  [facility] 11/17/2022, 8:19 AM Maurie Boettcher, OT/L   Acute OT Clinical Specialist Acute Rehabilitation Services Pager [phone] Office [phone]

## 2022-11-30 DEATH — deceased
